# Patient Record
Sex: Female | Born: 1935 | Race: White | Hispanic: No | Smoking: Never smoker
Health system: Southern US, Community
[De-identification: ages and names within clinical notes are randomized; demographics above are authoritative.]

## PROBLEM LIST (undated history)

## (undated) DIAGNOSIS — I517 Cardiomegaly: Secondary | ICD-10-CM

## (undated) DIAGNOSIS — R32 Unspecified urinary incontinence: Secondary | ICD-10-CM

## (undated) DIAGNOSIS — E785 Hyperlipidemia, unspecified: Secondary | ICD-10-CM

## (undated) DIAGNOSIS — M545 Low back pain: Secondary | ICD-10-CM

## (undated) DIAGNOSIS — E049 Nontoxic goiter, unspecified: Secondary | ICD-10-CM

## (undated) DIAGNOSIS — M159 Polyosteoarthritis, unspecified: Secondary | ICD-10-CM

## (undated) DIAGNOSIS — K219 Gastro-esophageal reflux disease without esophagitis: Secondary | ICD-10-CM

## (undated) DIAGNOSIS — D126 Benign neoplasm of colon, unspecified: Secondary | ICD-10-CM

## (undated) DIAGNOSIS — Z8719 Personal history of other diseases of the digestive system: Secondary | ICD-10-CM

## (undated) HISTORY — DX: Cardiomegaly: I51.7

## (undated) HISTORY — DX: Benign neoplasm of colon, unspecified: D12.6

## (undated) HISTORY — PX: THYROIDECTOMY, PARTIAL: SHX18

## (undated) HISTORY — DX: Low back pain: M54.5

## (undated) HISTORY — DX: Polyosteoarthritis, unspecified: M15.9

## (undated) HISTORY — PX: SPINE SURGERY: SHX786

## (undated) HISTORY — DX: Nontoxic goiter, unspecified: E04.9

## (undated) HISTORY — DX: Gastro-esophageal reflux disease without esophagitis: K21.9

## (undated) HISTORY — DX: Hyperlipidemia, unspecified: E78.5

## (undated) HISTORY — PX: ABDOMINAL HYSTERECTOMY: SHX81

## (undated) HISTORY — DX: Unspecified urinary incontinence: R32

## (undated) HISTORY — DX: Personal history of other diseases of the digestive system: Z87.19

---
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
## 1973-02-15 HISTORY — PX: NOSE SURGERY: SHX723

## 1997-02-15 LAB — CONVERTED CEMR LAB: Pap Smear: NORMAL

## 1998-04-07 ENCOUNTER — Other Ambulatory Visit: Admission: RE | Admit: 1998-04-07 | Discharge: 1998-04-07 | Payer: Self-pay | Admitting: Gastroenterology

## 1999-05-11 ENCOUNTER — Encounter: Payer: Self-pay | Admitting: Gynecology

## 1999-05-11 ENCOUNTER — Encounter: Admission: RE | Admit: 1999-05-11 | Discharge: 1999-05-11 | Payer: Self-pay | Admitting: Gynecology

## 2000-03-18 ENCOUNTER — Encounter: Admission: RE | Admit: 2000-03-18 | Discharge: 2000-03-18 | Payer: Self-pay | Admitting: Family Medicine

## 2000-03-18 ENCOUNTER — Encounter: Payer: Self-pay | Admitting: Family Medicine

## 2000-05-23 ENCOUNTER — Encounter: Payer: Self-pay | Admitting: Gastroenterology

## 2000-05-23 ENCOUNTER — Ambulatory Visit (HOSPITAL_COMMUNITY): Admission: RE | Admit: 2000-05-23 | Discharge: 2000-05-23 | Payer: Self-pay | Admitting: Gastroenterology

## 2000-06-08 ENCOUNTER — Encounter: Admission: RE | Admit: 2000-06-08 | Discharge: 2000-06-08 | Payer: Self-pay | Admitting: Gynecology

## 2000-06-08 ENCOUNTER — Encounter: Payer: Self-pay | Admitting: Gynecology

## 2000-06-14 ENCOUNTER — Encounter: Payer: Self-pay | Admitting: Orthopedic Surgery

## 2000-06-14 ENCOUNTER — Encounter: Admission: RE | Admit: 2000-06-14 | Discharge: 2000-06-14 | Payer: Self-pay | Admitting: Orthopedic Surgery

## 2000-06-27 ENCOUNTER — Other Ambulatory Visit: Admission: RE | Admit: 2000-06-27 | Discharge: 2000-06-27 | Payer: Self-pay | Admitting: Gynecology

## 2001-02-22 LAB — HM MAMMOGRAPHY: HM Mammogram: NORMAL

## 2004-09-15 ENCOUNTER — Ambulatory Visit: Payer: Self-pay | Admitting: Cardiovascular Disease

## 2004-09-29 ENCOUNTER — Ambulatory Visit: Payer: Self-pay

## 2004-10-07 ENCOUNTER — Ambulatory Visit: Payer: Self-pay | Admitting: Gastroenterology

## 2004-10-15 ENCOUNTER — Ambulatory Visit: Payer: Self-pay | Admitting: Gastroenterology

## 2004-10-15 ENCOUNTER — Ambulatory Visit (HOSPITAL_COMMUNITY): Admission: RE | Admit: 2004-10-15 | Discharge: 2004-10-15 | Payer: Self-pay | Admitting: Gastroenterology

## 2006-05-04 ENCOUNTER — Ambulatory Visit (HOSPITAL_COMMUNITY): Admission: RE | Admit: 2006-05-04 | Discharge: 2006-05-04 | Payer: Self-pay | Admitting: Gastroenterology

## 2006-05-10 ENCOUNTER — Ambulatory Visit: Payer: Self-pay | Admitting: Gastroenterology

## 2006-06-08 ENCOUNTER — Ambulatory Visit: Payer: Self-pay | Admitting: Gastroenterology

## 2006-06-28 ENCOUNTER — Ambulatory Visit: Payer: Self-pay | Admitting: Gastroenterology

## 2006-06-28 ENCOUNTER — Encounter: Payer: Self-pay | Admitting: Gastroenterology

## 2007-02-23 LAB — HM COLONOSCOPY: HM Colonoscopy: NORMAL

## 2007-08-23 ENCOUNTER — Telehealth: Payer: Self-pay | Admitting: Gastroenterology

## 2008-09-23 ENCOUNTER — Telehealth: Payer: Self-pay | Admitting: Gastroenterology

## 2010-02-19 ENCOUNTER — Encounter
Admission: RE | Admit: 2010-02-19 | Discharge: 2010-02-19 | Payer: Self-pay | Source: Home / Self Care | Attending: Orthopedic Surgery | Admitting: Orthopedic Surgery

## 2010-02-20 ENCOUNTER — Ambulatory Visit
Admission: RE | Admit: 2010-02-20 | Discharge: 2010-02-20 | Payer: Self-pay | Source: Home / Self Care | Attending: Family Medicine | Admitting: Family Medicine

## 2010-02-20 ENCOUNTER — Encounter: Payer: Self-pay | Admitting: Family Medicine

## 2010-02-20 ENCOUNTER — Other Ambulatory Visit: Payer: Self-pay | Admitting: Family Medicine

## 2010-02-20 DIAGNOSIS — M159 Polyosteoarthritis, unspecified: Secondary | ICD-10-CM

## 2010-02-20 DIAGNOSIS — M545 Low back pain, unspecified: Secondary | ICD-10-CM

## 2010-02-20 DIAGNOSIS — K219 Gastro-esophageal reflux disease without esophagitis: Secondary | ICD-10-CM | POA: Insufficient documentation

## 2010-02-20 DIAGNOSIS — Z8719 Personal history of other diseases of the digestive system: Secondary | ICD-10-CM

## 2010-02-20 DIAGNOSIS — R32 Unspecified urinary incontinence: Secondary | ICD-10-CM | POA: Insufficient documentation

## 2010-02-20 DIAGNOSIS — E785 Hyperlipidemia, unspecified: Secondary | ICD-10-CM

## 2010-02-20 DIAGNOSIS — E049 Nontoxic goiter, unspecified: Secondary | ICD-10-CM

## 2010-02-20 DIAGNOSIS — D126 Benign neoplasm of colon, unspecified: Secondary | ICD-10-CM | POA: Insufficient documentation

## 2010-02-20 HISTORY — DX: Gastro-esophageal reflux disease without esophagitis: K21.9

## 2010-02-20 HISTORY — DX: Nontoxic goiter, unspecified: E04.9

## 2010-02-20 HISTORY — DX: Benign neoplasm of colon, unspecified: D12.6

## 2010-02-20 HISTORY — DX: Hyperlipidemia, unspecified: E78.5

## 2010-02-20 HISTORY — DX: Low back pain, unspecified: M54.50

## 2010-02-20 HISTORY — DX: Polyosteoarthritis, unspecified: M15.9

## 2010-02-20 HISTORY — DX: Personal history of other diseases of the digestive system: Z87.19

## 2010-02-20 HISTORY — DX: Unspecified urinary incontinence: R32

## 2010-02-20 LAB — TSH: TSH: 0.71 u[IU]/mL (ref 0.35–5.50)

## 2010-03-04 ENCOUNTER — Encounter: Payer: Self-pay | Admitting: Family Medicine

## 2010-03-16 ENCOUNTER — Telehealth: Payer: Self-pay | Admitting: Family Medicine

## 2010-03-16 DIAGNOSIS — I517 Cardiomegaly: Secondary | ICD-10-CM

## 2010-03-16 HISTORY — DX: Cardiomegaly: I51.7

## 2010-03-19 NOTE — Assessment & Plan Note (Signed)
Summary: NEW PT EST (TXFR SFP/CS) - MEDICARE INS // RS   Vital Signs:  Patient profile:   77 year old female Menstrual status:  postmenopausal Height:      60 inches Weight:      148 pounds BMI:     29.01 Temp:     98.0 degrees F oral Pulse rate:   88 / minute Pulse rhythm:   regular Resp:     12 per minute BP sitting:   140 / 88  (left arm) Cuff size:   regular  Vitals Entered By: Sid Falcon LPN (February 20, 2010 10:24 AM)  Nutrition Counseling: Patient's BMI is greater than 25 and therefore counseled on weight management options.     Menstrual Status postmenopausal Last PAP Result normal   History of Present Illness: New to establish care.  Multiple medical problems reviewed. She has hx multiple back surgeries, GERD, colon polyps, diverticulitis, hx goiter, hyperlipidemia, urine incontinence, osteoarthritis. Very sedentary and limited in activities from defecits from her back  problems.  GERD symptoms stable.  No dysphagia.   Urine urge incontinence,  Symptoms controlled with Sanctura. Hx of goiter with subtotal thyroidectomy.  HAs some chronic fatigue. Thyroid not checked in over 2 years.  Planning for L shoulder replacement probably end of January.  No cardiac or lung disease hx.  nonsmoker.    no flu vaccine yet.  PVX  2009.  Last tetanus unknown.  Preventive Screening-Counseling & Management  Alcohol-Tobacco     Smoking Status: never  Caffeine-Diet-Exercise     Does Patient Exercise: no  Past History:  Family History: Last updated: 02/20/2010 mother, ovarian CA, elevated cholesterol, diabetes type ll  Social History: Last updated: 02/20/2010 Retired Married Never Smoked Alcohol use-no Regular exercise-no 5 pregnancies 5 live births  Risk Factors: Exercise: no (02/20/2010)  Risk Factors: Smoking Status: never (02/20/2010)  Past Medical History: Diverticulitis, hx of GERD Hyperlipidemia Urinary incontinence Hx goiter Polyps in  colon Blood transfusion  Past Surgical History: Hysterectomy 1998 total Tonsillectomy 1936 4 back surgeries 1981-1986 Partial thyroidectomy 1975 ?goiter, no cancer Head surgery 1975 to repair cracked bone over nose PMH-FH-SH reviewed for relevance  Family History: mother, ovarian CA, elevated cholesterol, diabetes type ll  Social History: Retired Married Never Smoked Alcohol use-no Regular exercise-no 5 pregnancies 5 live births Smoking Status:  never Does Patient Exercise:  no  Review of Systems  The patient denies anorexia, fever, weight loss, weight gain, hoarseness, chest pain, syncope, dyspnea on exertion, peripheral edema, prolonged cough, headaches, hemoptysis, abdominal pain, melena, hematochezia, severe indigestion/heartburn, hematuria, muscle weakness, transient blindness, depression, and enlarged lymph nodes.    Physical Exam  General:  Well-developed,well-nourished,in no acute distress; alert,appropriate and cooperative throughout examination Head:  Normocephalic and atraumatic without obvious abnormalities. No apparent alopecia or balding. Eyes:  pupils equal, pupils round, and pupils reactive to light.   Ears:  External ear exam shows no significant lesions or deformities.  Otoscopic examination reveals clear canals, tympanic membranes are intact bilaterally without bulging, retraction, inflammation or discharge. Hearing is grossly normal bilaterally. Mouth:  Oral mucosa and oropharynx without lesions or exudates.  Teeth in good repair. Neck:  No deformities, masses, or tenderness noted. Lungs:  Normal respiratory effort, chest expands symmetrically. Lungs are clear to auscultation, no crackles or wheezes. Heart:  normal rate and regular rhythm.   Abdomen:  soft and non-tender.  ?lipoma nontender and about 3 cm diameter L mid abdomen.  no other masses. Extremities:  no edema. Neurologic:  alert & oriented X3 and cranial nerves II-XII intact.   Skin:  no rashes  and no suspicious lesions.   Cervical Nodes:  No lymphadenopathy noted Psych:  normally interactive, good eye contact, not anxious appearing, and not depressed appearing.     Impression & Recommendations:  Problem # 1:  URINARY INCONTINENCE (ICD-788.30)  Problem # 2:  GERD (ICD-530.81)  Her updated medication list for this problem includes:    Nexium 40 Mg Cpdr (Esomeprazole magnesium) .Marland Kitchen... 1 capsule each day 30 minutes before meal  Problem # 3:  DIVERTICULITIS, HX OF (ICD-V12.79)  Problem # 4:  BACK PAIN, LUMBAR, CHRONIC (ICD-724.2)  Her updated medication list for this problem includes:    Methocarbamol 500 Mg Tabs (Methocarbamol) ..... Once daily  Problem # 5:  OSTEOARTHRITIS, MULTIPLE JOINTS (ICD-715.89)  Problem # 6:  COLONIC POLYPS (ICD-211.3)  Problem # 7:  Preventive Health Care (ICD-V70.0) needs flu vaccine.  PVX up to date.  Problem # 8:  PREOPERATIVE EXAMINATION (ICD-V72.84) EKG no acute changes.  Set up CXR.   Orders: EKG w/ Interpretation (93000) T-2 View CXR (71020TC)  Complete Medication List: 1)  Nexium 40 Mg Cpdr (Esomeprazole magnesium) .Marland Kitchen.. 1 capsule each day 30 minutes before meal 2)  Sanctura Xr 60 Mg Xr24h-cap (Trospium chloride) .... Once daily 3)  Methocarbamol 500 Mg Tabs (Methocarbamol) .... Once daily  Other Orders: Flu Vaccine 27yrs + [phone]) Admin 1st Vaccine (98119) TLB-TSH (Thyroid Stimulating Hormone) (84443-TSH)   Orders Added: 1)  EKG w/ Interpretation [93000] 2)  Flu Vaccine 44yrs + [90658] 3)  Admin 1st Vaccine [90471] 4)  TLB-TSH (Thyroid Stimulating Hormone) [84443-TSH] 5)  T-2 View CXR [71020TC] 6)  New Patient Level IV [14782]   Immunization History:  Pneumovax Immunization History:    Pneumovax:  historical (02/16/2008)  Immunizations Administered:  Influenza Vaccine # 1:    Vaccine Type: Fluvax 3+    Site: left deltoid    Mfr: Sanofi Pasteur    Dose: 0.5 ml    Route: IM    Given by: Sid Falcon LPN    Exp.  Date: 07/17/2010    Lot #: [id number]  Flu Vaccine Consent Questions:    Do you have a history of severe allergic reactions to this vaccine? no    Any prior history of allergic reactions to egg and/or gelatin? no    Do you have a sensitivity to the preservative Thimersol? no    Do you have a past history of Guillan-Barre Syndrome? no    Do you currently have an acute febrile illness? no    Have you ever had a severe reaction to latex? no    Vaccine information given and explained to patient? yes    Are you currently pregnant? no   Immunization History:  Pneumovax Immunization History:    Pneumovax:  Historical (02/16/2008)  Immunizations Administered:  Influenza Vaccine # 1:    Vaccine Type: Fluvax 3+    Site: left deltoid    Mfr: Sanofi Pasteur    Dose: 0.5 ml    Route: IM    Given by: Sid Falcon LPN    Exp. Date: 07/17/2010    Lot #: [id number]  Preventive Care Screening  Colonoscopy:    Date:  02/16/2007    Results:  normal   Mammogram:    Date:  02/15/2001    Results:  normal   Pap Smear:    Date:  02/15/1997    Results:  normal

## 2010-03-24 ENCOUNTER — Ambulatory Visit (HOSPITAL_COMMUNITY): Payer: Medicare Other | Attending: Family Medicine

## 2010-03-24 DIAGNOSIS — R9389 Abnormal findings on diagnostic imaging of other specified body structures: Secondary | ICD-10-CM | POA: Insufficient documentation

## 2010-03-24 DIAGNOSIS — I079 Rheumatic tricuspid valve disease, unspecified: Secondary | ICD-10-CM | POA: Insufficient documentation

## 2010-03-24 DIAGNOSIS — I379 Nonrheumatic pulmonary valve disorder, unspecified: Secondary | ICD-10-CM | POA: Insufficient documentation

## 2010-03-24 DIAGNOSIS — I059 Rheumatic mitral valve disease, unspecified: Secondary | ICD-10-CM | POA: Insufficient documentation

## 2010-03-24 DIAGNOSIS — E785 Hyperlipidemia, unspecified: Secondary | ICD-10-CM | POA: Insufficient documentation

## 2010-03-24 DIAGNOSIS — I517 Cardiomegaly: Secondary | ICD-10-CM | POA: Insufficient documentation

## 2010-03-24 DIAGNOSIS — I319 Disease of pericardium, unspecified: Secondary | ICD-10-CM | POA: Insufficient documentation

## 2010-03-25 NOTE — Progress Notes (Signed)
Summary: surgical clearance  Phone Note Call from Patient Call back at Home Phone [phone]   Caller: Patient Reason for Call: Talk to Doctor Summary of Call: patient is calling because her doctor at [facility] is waiting on a surgical clearance. Initial call taken by: Kern Reap CMA Duncan Dull),  March 16, 2010 2:18 PM  Follow-up for Phone Call        spoke with pt . CXR mild cardiomegaly.  Pt for elective ortho surgey. No dypsnea or chest pain.  Will set up 2 D ECHO to evaluate. NOtify pt of appt. Follow-up by: Evelena Peat MD,  March 17, 2010 8:33 AM  New Problems: CARDIOMEGALY (ICD-429.3)   New Problems: CARDIOMEGALY (ICD-429.3)

## 2010-03-25 NOTE — Letter (Signed)
Summary: Request for Surgical Clearance/[facility]  Request for Surgical Clearance/[facility]   Imported By: Maryln Gottron 03/20/2010 12:36:54  _____________________________________________________________________  External Attachment:    Type:   Image     Comment:   External Document

## 2010-03-27 ENCOUNTER — Telehealth: Payer: Self-pay | Admitting: *Deleted

## 2010-03-27 NOTE — Telephone Encounter (Signed)
Pt would like Echo results.

## 2010-03-27 NOTE — Telephone Encounter (Signed)
Message left for Dr Dietrich Pates surgery scheduler Clydie Braun at [facility] that it is OK to schedule surgery for pt, pt informed also

## 2010-03-27 NOTE — Telephone Encounter (Signed)
ECHO showed no acute abnormality.  She should be able to schedule surgery at this time.  We need to contact [facility] so they can schedule her surgery.

## 2010-04-20 ENCOUNTER — Encounter (HOSPITAL_COMMUNITY)
Admission: RE | Admit: 2010-04-20 | Discharge: 2010-04-20 | Disposition: A | Discharge status: Home / Self Care | Payer: Medicare Other | Source: Ambulatory Visit | Attending: Orthopedic Surgery | Admitting: Orthopedic Surgery

## 2010-04-20 DIAGNOSIS — Z01818 Encounter for other preprocedural examination: Secondary | ICD-10-CM | POA: Insufficient documentation

## 2010-04-20 DIAGNOSIS — Z01810 Encounter for preprocedural cardiovascular examination: Secondary | ICD-10-CM | POA: Insufficient documentation

## 2010-04-20 DIAGNOSIS — Z01812 Encounter for preprocedural laboratory examination: Secondary | ICD-10-CM | POA: Insufficient documentation

## 2010-04-20 DIAGNOSIS — M19019 Primary osteoarthritis, unspecified shoulder: Secondary | ICD-10-CM | POA: Insufficient documentation

## 2010-04-20 LAB — CBC
Hemoglobin: 14.5 g/dL (ref 12.0–15.0)
Platelets: 343 10*3/uL (ref 150–400)
RBC: 4.88 MIL/uL (ref 3.87–5.11)
WBC: 7.2 10*3/uL (ref 4.0–10.5)

## 2010-04-20 LAB — PROTIME-INR
INR: 0.94 (ref 0.00–1.49)
Prothrombin Time: 12.8 seconds (ref 11.6–15.2)

## 2010-04-20 LAB — TYPE AND SCREEN
ABO/RH(D): O POS
Antibody Screen: NEGATIVE

## 2010-04-20 LAB — BASIC METABOLIC PANEL
CO2: 23 mEq/L (ref 19–32)
Chloride: 105 mEq/L (ref 96–112)
GFR calc Af Amer: >60 mL/min (ref >60)
Potassium: 3.9 mEq/L (ref 3.5–5.1)
Sodium: 138 mEq/L (ref 135–145)

## 2010-04-20 LAB — DIFFERENTIAL
Basophils Relative: 0 % (ref 0–1)
Eosinophils Absolute: 0 10*3/uL (ref 0.0–0.7)
Lymphs Abs: 2.1 10*3/uL (ref 0.7–4.0)
Neutro Abs: 4.4 10*3/uL (ref 1.7–7.7)
Neutrophils Relative %: 61 % (ref 43–77)

## 2010-04-20 LAB — ABO/RH: ABO/RH(D): O POS

## 2010-04-20 LAB — APTT: aPTT: 27 seconds (ref 24–37)

## 2010-04-24 ENCOUNTER — Ambulatory Visit (HOSPITAL_COMMUNITY)
Admission: RE | Admit: 2010-04-24 | Discharge: 2010-04-24 | Disposition: A | Discharge status: Home / Self Care | Payer: Medicare Other | Source: Ambulatory Visit | Attending: Orthopedic Surgery | Admitting: Orthopedic Surgery

## 2010-04-24 ENCOUNTER — Inpatient Hospital Stay (HOSPITAL_COMMUNITY)
Admission: RE | Admit: 2010-04-24 | Discharge: 2010-04-29 | DRG: 483 | Disposition: A | Discharge status: Skilled Nursing Facility | Payer: Medicare Other | Source: Ambulatory Visit | Attending: Orthopedic Surgery | Admitting: Orthopedic Surgery

## 2010-04-24 ENCOUNTER — Inpatient Hospital Stay (HOSPITAL_COMMUNITY): Payer: Medicare Other

## 2010-04-24 ENCOUNTER — Other Ambulatory Visit (HOSPITAL_COMMUNITY): Payer: Self-pay | Admitting: Orthopedic Surgery

## 2010-04-24 DIAGNOSIS — M19019 Primary osteoarthritis, unspecified shoulder: Principal | ICD-10-CM | POA: Diagnosis present

## 2010-04-24 DIAGNOSIS — J9600 Acute respiratory failure, unspecified whether with hypoxia or hypercapnia: Secondary | ICD-10-CM | POA: Diagnosis not present

## 2010-04-24 DIAGNOSIS — K5900 Constipation, unspecified: Secondary | ICD-10-CM | POA: Diagnosis not present

## 2010-04-24 DIAGNOSIS — Z01812 Encounter for preprocedural laboratory examination: Secondary | ICD-10-CM

## 2010-04-24 DIAGNOSIS — R000 Tachycardia, unspecified: Secondary | ICD-10-CM | POA: Diagnosis not present

## 2010-04-24 DIAGNOSIS — J984 Other disorders of lung: Secondary | ICD-10-CM

## 2010-04-24 DIAGNOSIS — E785 Hyperlipidemia, unspecified: Secondary | ICD-10-CM | POA: Diagnosis present

## 2010-04-24 DIAGNOSIS — I2699 Other pulmonary embolism without acute cor pulmonale: Secondary | ICD-10-CM | POA: Diagnosis not present

## 2010-04-24 DIAGNOSIS — K219 Gastro-esophageal reflux disease without esophagitis: Secondary | ICD-10-CM | POA: Diagnosis present

## 2010-04-25 LAB — CBC
HCT: 34.3 % — ABNORMAL LOW (ref 36.0–46.0)
Hemoglobin: 11.4 g/dL — ABNORMAL LOW (ref 12.0–15.0)
MCH: 29.2 pg (ref 26.0–34.0)
MCHC: 33.2 g/dL (ref 30.0–36.0)
MCV: 87.7 fL (ref 78.0–100.0)

## 2010-04-25 LAB — BASIC METABOLIC PANEL
BUN: 9 mg/dL (ref 6–23)
CO2: 24 mEq/L (ref 19–32)
Calcium: 8.2 mg/dL — ABNORMAL LOW (ref 8.4–10.5)
Glucose, Bld: 134 mg/dL — ABNORMAL HIGH (ref 70–99)
Sodium: 137 mEq/L (ref 135–145)

## 2010-04-26 LAB — CBC
Hemoglobin: 10.8 g/dL — ABNORMAL LOW (ref 12.0–15.0)
MCH: 28.6 pg (ref 26.0–34.0)
MCHC: 32.5 g/dL (ref 30.0–36.0)

## 2010-04-26 LAB — BASIC METABOLIC PANEL
CO2: 24 mEq/L (ref 19–32)
Calcium: 8.4 mg/dL (ref 8.4–10.5)
Creatinine, Ser: 0.55 mg/dL (ref 0.4–1.2)
GFR calc Af Amer: >60 mL/min (ref >60)
Glucose, Bld: 130 mg/dL — ABNORMAL HIGH (ref 70–99)

## 2010-04-27 ENCOUNTER — Inpatient Hospital Stay (HOSPITAL_COMMUNITY): Payer: Medicare Other

## 2010-04-27 LAB — PROTIME-INR
INR: 1.11 (ref 0.00–1.49)
Prothrombin Time: 14.5 seconds (ref 11.6–15.2)

## 2010-04-27 LAB — BASIC METABOLIC PANEL
BUN: 5 mg/dL — ABNORMAL LOW (ref 6–23)
Calcium: 7.9 mg/dL — ABNORMAL LOW (ref 8.4–10.5)
Creatinine, Ser: 0.48 mg/dL (ref 0.4–1.2)
GFR calc non Af Amer: >60 mL/min (ref >60)
Glucose, Bld: 145 mg/dL — ABNORMAL HIGH (ref 70–99)

## 2010-04-27 LAB — HEPATIC FUNCTION PANEL
AST: 27 U/L (ref 0–37)
Albumin: 2.5 g/dL — ABNORMAL LOW (ref 3.5–5.2)
Bilirubin, Direct: <0.1 mg/dL (ref 0.0–0.3)

## 2010-04-27 LAB — CBC
MCH: 29 pg (ref 26.0–34.0)
MCV: 88.7 fL (ref 78.0–100.0)
Platelets: 227 10*3/uL (ref 150–400)
RDW: 14.5 % (ref 11.5–15.5)
WBC: 10.3 10*3/uL (ref 4.0–10.5)

## 2010-04-27 LAB — TROPONIN I: Troponin I: 0.09 ng/mL — ABNORMAL HIGH (ref 0.00–0.06)

## 2010-04-27 MED ORDER — IOHEXOL 300 MG/ML  SOLN: 100.0000 mL | Freq: Once | INTRAMUSCULAR | Status: AC | PRN

## 2010-04-27 NOTE — Consult Note (Signed)
NAMEZORA, [name] NO.:  [id number]  MEDICAL RECORD NO.:  [id number]           PATIENT TYPE:  I  LOCATION:  5036                         FACILITY:  [facility]  PHYSICIAN:  Andreas Blower, MD       DATE OF BIRTH:  February 27, 1927  DATE OF CONSULTATION: DATE OF DISCHARGE:                                CONSULTATION   REFERRING PHYSICIAN:  Almedia Balls. Ranell Patrick, MD  PRIMARY CARE PHYSICIAN:  Evelena Peat, MD  ORTHOPEDIC PHYSICIAN:  Almedia Balls. Norris, MD  Clinical question to be answered is acute shortness of breath and tachycardia.  HISTORY OF PRESENT ILLNESS:  Ms. [name] is an 77 year old Caucasian female with history of borderline hypertension, not on any antihypertensive medications; GERD; hyperlipidemia; constipation; chronic right and left shoulder pain, who had left shoulder reverse total shoulder arthroplasty on April 24, 2010.  She reported that she was short of breath yesterday April 26, 2010 in the evening.  However, hershortness of breath improved.  Today after working with Physical Therapy at around at around 10 to 11 AM, subsequently after that she became severely short of breath with hypoxemia.  She desaturated down to low 90s to upper 80s requiring oxygen.  Subsequently since then her oxygen requirements have gone up and currently is on a face mask.  She had a CT of the chest with contrast which showed multiple small submental PE bilaterally.  She was started on heparin and the hospitalist service was consulted for further management.  The patient denies any recent fevers, chills, denies any nausea or vomiting, denies any chest pain.  She does report her shortness of breath has improved since this morning.  Denies any abdominal pain.  Does feel constipated.  Denies any diarrhea, headaches, or vision changes.  REVIEW OF SYSTEMS:  All systems are reviewed with the patient and was positive as per HPI, otherwise all other systems are negative.  PAST  MEDICAL HISTORY: 1. Borderline hypertension.  Not on any antihypertensive medications. 2. GERD. 3. Hyperlipidemia. 4. History of constipation 5. Chronic left and right shoulder pain. 6. Status post left shoulder reverse total arthroplasty on April 24, 2010. 7. History of leg muscle spasm, takes p.r.n., methocarbamol. 8. History of 4 back surgeries. 9. History of hysterectomy.  SOCIAL HISTORY:  The patient does not smoke.  Reports that she would smoke 1 cigarette a week when she was young, has not smoked for the last 60 years.  Denies any illegal drugs or substances.  FAMILY HISTORY:  Significant for mother who died from uterine cancer in July 20, 1964.  Father died from old age in 31.  HOME MEDICATIONS:  The patient reports that she is on: 1. Methocarbamol 500 mg twice daily as needed. 2. Docusate 200 mg p.o. daily. 3. Hydrocodone/APAP 5/325 mg p.o. q.6 h. 4. Tylenol extra strength 1000 mg every 6 hours as needed for pain. 5. Multivitamins 1 tablet p.o. daily. 6. Fish oil over-the-counter 1 capsule p.o. daily 7. Nexium 40 mg p.o. daily 8. Sanctura extended release 60 mg p.o. daily.  PHYSICAL EXAMINATION:  VITAL SIGNS:  Temperature is 97.3, heart rate is 125, respiration is 24,  blood pressure is 143/83, satting in the 90s on face mask. GENERAL:  The patient was awake, in some distress from shortness of breath, was lying in bed. HEENT:  Extraocular motions are intact.  Pupils equal, round, had moist mucous membranes. NECK:  Supple. HEART:  Regular with S1, S2, was tachycardic. LUNGS:  Good air movement.  Do not appreciate any wheezing or crackles. ABDOMEN:  Soft, nontender, nondistended.  Positive bowel sounds. EXTREMITIES:  The patient good peripheral pulses with trace edema. NEURO:  Cranial nerves II-XII grossly intact at 5/5 motor strength in upper as well as lower extremities.  RADIOLOGY/IMAGING:  The patient had a CT of the chest with contrast on April 27, 2010 which  showed multiple small subsegmental pulmonary emboli bilaterally.  No findings to suggest right heart strain.  Large hiatal hernia.  Left lower lobe atelectasis.  Status post left shoulder arthroplasty. The patient had multiple x-rays on April 24, 2010, most recent showed anatomic alignment post left shoulder arthroplasty without acute complicating features.  LABORATORY DATA:  CBC shows a white count of 8.4, hemoglobin 10.8, hematocrit 33.2, platelet count 225.  Electrolytes normal with a creatinine of 0.54.  All the labs most recently were on April 26, 2010.  ASSESSMENT AND PLAN: 1. Acute hypoxic respiratory failure due to acute pulmonary embolism.     Currently, the patient is on face mask.  Her breathing is improved,     is talking to me in broken sentences, is improved from earlier     today. 2. Acute multiple bilateral subsegmental pulmonary emboli.  The     patient has already been started on heparin.  We will start on     Coumadin.  We will get a 2-D echocardiogram for better evaluation.     The patient will need a 5-day bridge of heparin products with     Coumadin.  I suspect her pulmonary embolism is most likely due to     poor mobility post surgery.  Agree with having the patient     transferred to step-down for closer monitoring. 3. Borderline hypertension, blood pressure stable at this time.     Continue to monitor for now. 4. Hyperlipidemia.  Reports that she has been on cholesterol-lowering     medications; however as her cholesterol improved, she was taken off     of that medication, currently is on fish oil.  Continue omega-3     fatty acids. 5. Constipation.  The patient is currently on a bowel regimen. 6. Gastroesophageal reflux disease/hiatal hernia.  Continue the     patient on PPI. 7. Left shoulder reverse total arthroplasty on April 24, 2010.     Per primary service okay to use heparin. 8. Prophylaxis.  Currently on a heparin drip.  We will start the     patient  on Coumadin.  Thank you for the consultation.  We will continue to follow.   Andreas Blower, MD   SR/MEDQ  D:  04/27/2010  T:  04/27/2010  Job:  [id number]  Electronically Signed by Wardell Heath Henreitta Spittler  on 04/27/2010 09:58:27 PM

## 2010-04-28 LAB — BASIC METABOLIC PANEL
BUN: 6 mg/dL (ref 6–23)
Chloride: 106 mEq/L (ref 96–112)
GFR calc Af Amer: >60 mL/min (ref >60)
GFR calc non Af Amer: >60 mL/min (ref >60)
Potassium: 4.3 mEq/L (ref 3.5–5.1)
Sodium: 136 mEq/L (ref 135–145)

## 2010-04-28 LAB — CBC
HCT: 29.7 % — ABNORMAL LOW (ref 36.0–46.0)
Hemoglobin: 10.5 g/dL — ABNORMAL LOW (ref 12.0–15.0)
Hemoglobin: 9.9 g/dL — ABNORMAL LOW (ref 12.0–15.0)
MCH: 29.2 pg (ref 26.0–34.0)
MCHC: 33.3 g/dL (ref 30.0–36.0)
Platelets: 222 10*3/uL (ref 150–400)
RBC: 3.6 MIL/uL — ABNORMAL LOW (ref 3.87–5.11)
RBC: 3.35 MIL/uL — ABNORMAL LOW (ref 3.87–5.11)
WBC: 9.3 10*3/uL (ref 4.0–10.5)

## 2010-04-28 LAB — PROTIME-INR
INR: 1.23 (ref 0.00–1.49)
Prothrombin Time: 15.7 seconds — ABNORMAL HIGH (ref 11.6–15.2)

## 2010-04-28 LAB — TROPONIN I
Troponin I: 0.06 ng/mL (ref 0.00–0.06)
Troponin I: 0.08 ng/mL — ABNORMAL HIGH (ref 0.00–0.06)

## 2010-04-28 LAB — HEPARIN LEVEL (UNFRACTIONATED): Heparin Unfractionated: 0.19 IU/mL — ABNORMAL LOW (ref 0.30–0.70)

## 2010-04-28 MED ORDER — IOHEXOL 300 MG/ML  SOLN
100.0000 mL | Freq: Once | INTRAMUSCULAR | Status: AC | PRN
  Administered 2010-04-28: 100 mL via INTRAVENOUS

## 2010-04-29 LAB — PROTIME-INR
INR: 2.7 — ABNORMAL HIGH (ref 0.00–1.49)
Prothrombin Time: 28.8 seconds — ABNORMAL HIGH (ref 11.6–15.2)

## 2010-04-29 LAB — BASIC METABOLIC PANEL
BUN: 10 mg/dL (ref 6–23)
Calcium: 8.3 mg/dL — ABNORMAL LOW (ref 8.4–10.5)
Chloride: 108 mEq/L (ref 96–112)
Creatinine, Ser: 0.58 mg/dL (ref 0.4–1.2)
GFR calc Af Amer: >60 mL/min (ref >60)
GFR calc non Af Amer: >60 mL/min (ref >60)

## 2010-05-02 NOTE — Discharge Summary (Signed)
  [name], [name] NO.:  [id number]  MEDICAL RECORD NO.:  [id number]           PATIENT TYPE:  O  LOCATION:  XRAY                         FACILITY:  [facility]  PHYSICIAN:  Almedia Balls. Ranell Patrick, M.D. DATE OF BIRTH:  1927/05/10  DATE OF ADMISSION:  04/24/2010 DATE OF DISCHARGE:  04/27/2010                              DISCHARGE SUMMARY   ADMISSION DIAGNOSIS:  Left shoulder rotator cuff arthropathy.  DISCHARGE DIAGNOSIS:  Left shoulder rotator cuff arthropathy, status post reverse total shoulder arthroplasty.  BRIEF HISTORY:  The patient is an 77 year old female with worsening left shoulder pain and function secondary to rotator cuff arthropathy. Patient elected for surgical management, decrease pain and increase function.  PROCEDURE:  The patient had left total shoulder arthroplasty reversed by Dr. Malon Kindle on April 24, 2010.  Assistant was [employer], PA-C.  General anesthesia was used.  No complications.  HOSPITAL COURSE:  The patient admitted on April 24, 2010 for the above- stated procedure, which she tolerated well.  After adequate time of post anesthesia care, she was out up to 5000, postop day #1.  The patient complained of moderate pain to the left shoulder and was able to work gently with physical therapy and occupational therapy.  The rehab facility would be the best place for her.  The patient was in agreement with the plan.  She continued to work gently with physical therapy and occupational therapy over the next couple of days.  She was afebrile. Labs were in acceptable limits.  Neurovascularly, she is intact.  Wound was healing well.  She stayed in her sling.  DISCHARGE PLAN:  The patient will be discharged to skilled nursing facility on April 27, 2010.  Her condition is stable.  Her diet is regular.  The patient has no known drug allergies.  DISCHARGE MEDICATIONS:  Cipro 500 mg b.i.d. for 7 days, Enablex 7.5 mg daily, Robaxin 500 mg p.o.  q.6 hours, Protonix 80 mg daily, Norco 5/325 1-2 tablets every 4-6 hours for pain.  FOLLOWUP:  The patient will follow back up with Dr. Malon Kindle in 2 weeks.     Thomas B. Dixon, P.A.   ______________________________ Almedia Balls. Ranell Patrick, M.D.   TBD/MEDQ  D:  04/27/2010  T:  04/27/2010  Job:  [id number]  Electronically Signed by Standley Dakins P.A. on 04/28/2010 04:38:46 PM Electronically Signed by Malon Kindle  on 05/02/2010 11:22:33 AM

## 2010-05-02 NOTE — H&P (Signed)
  [name], [name] NO.:  [id number]  MEDICAL RECORD NO.:  [id number]           PATIENT TYPE:  LOCATION:                                 FACILITY:  PHYSICIAN:  Almedia Balls. Ranell Patrick, M.D. DATE OF BIRTH:  [birth year]  DATE OF ADMISSION: DATE OF DISCHARGE:                             HISTORY & PHYSICAL   CHIEF COMPLAINT:  Left shoulder pain.  HISTORY OF PRESENT ILLNESS:  The patient is an 77 year old female worsening left shoulder pain secondary to rotator cuff arthropathy.  The patient elected for surgical management to decrease pain and increase function.  PAST MEDICAL HISTORY:  Hyperlipidemia.  FAMILY MEDICAL HISTORY:  Cancer and diabetes.  SOCIAL HISTORY:  The patient of Dr. Caryl Never.  She does not smoke or use alcohol.  DRUG ALLERGIES:  None.  CURRENT MEDICATIONS: 1. Nexium 10 mg daily. 2. Norco 5/325 1 or 2 tablets q.4-6 hours p.r.n. pain. 3. Sanctura 60 mg daily.  REVIEW OF SYSTEMS:  Shows the patient does have some impaired hearing, pain on range of motion of bilateral shoulders, also history of chronic constipation.  PHYSICAL EXAMINATION:  VITAL SIGNS:  Pulse 66, respirations 16, blood pressure 118/82. GENERAL:  The patient is alert and appropriate 77 year old female, in no acute distress.  Adequate mood and affect here in the office today. HEAD AND NECK:  Shows cranial nerves II-XII grossly intact. NECK:  Shows full range of motion without any tenderness. CHEST:  Active breath sounds bilaterally.  No wheeze, rhonchi, or rales. HEART:  Regular rate and rhythm.  No murmur. ABDOMEN:  Nontender, nondistended. EXTREMITIES:  The patient does have moderate tenderness in bilateral shoulder with range of motion, decreased strength, 4/5 bilaterally.  She has no edema and no rashes.  X-ray showed rotator cuff arthropathy of the left shoulder.  IMPRESSION:  Left shoulder pain secondary to rotator cuff arthropathy.  PLAN OF ACTION:  Left total  shoulder arthroplasty reversed by Dr. Malon Kindle.     Thomas B. Dixon, P.A.   ______________________________ Almedia Balls. Ranell Patrick, M.D.    TBD/MEDQ  D:  04/16/2010  T:  04/17/2010  Job:  [id number]  Electronically Signed by Standley Dakins P.A. on 04/28/2010 04:38:43 PM Electronically Signed by Malon Kindle  on 05/02/2010 11:22:28 AM

## 2010-05-02 NOTE — Op Note (Signed)
NAMELATEEFA, [name] NO.:  [id number]  MEDICAL RECORD NO.:  [id number]           PATIENT TYPE:  LOCATION:                                 FACILITY:  PHYSICIAN:  Almedia Balls. Ranell Patrick, M.D. DATE OF BIRTH:  06-08-1927  DATE OF PROCEDURE:  04/24/2010 DATE OF DISCHARGE:                              OPERATIVE REPORT   PREOPERATIVE DIAGNOSIS:  Left shoulder rotator cuff tear arthropathy.  POSTOPERATIVE DIAGNOSIS:  Left shoulder rotator cuff tear arthropathy.  PROCEDURE PERFORMED:  Left shoulder reverse total shoulder arthroplasty using DePuy Delta Xtend system.  ATTENDING SURGEON:  Almedia Balls. Ranell Patrick, MD  ASSISTANT:  Donnie Coffin. Dixon, PA-C  ANESTHESIA:  General anesthesia was used plus interscalene block.  ESTIMATED BLOOD LOSS:  Minimal.  FLUID REPLACEMENT:  1200 mL crystalloid.  INSTRUMENT COUNT:  Correct.  There were no complications.  Perioperative antibiotics were given.  INDICATIONS:  The patient is an 77 year old female with worsening left shoulder pain and loss of function secondary to rotator cuff tear arthropathy.  The patient has had progressive pain despite conservative who presents now for operative total shoulder arthroplasty to restore function, eliminate pain to her shoulder.  Informed consent obtained.  DESCRIPTION OF PROCEDURE:  After adequate level was anesthesia was achieved, the patient was positioned in the modified beach-chair position.  Left shoulder was sterilely prepped and draped in usual manner.  Deltopectoral approach was utilized during the coracoid process extending distally to the anterior humerus.  Dissection down through subcutaneous tissues using Bovie electrocautery.  The cephalic vein was identified and taken laterally to the deltoid and pectoralis was taken medially.  The upper centimeter pectoralis was released.  Conjoined tendon identified, taken medially as well.  At this point, we identified the subscap and released it  off the lesser tuberosity.  Progressive soft tissue release off the humeral neck followed by removal of anterior- inferior capsule.  We then delivered the humerus out of the wound.  We then reamed up to a size 10 reamer and then went ahead and placed our intramedullary neck resection guide and resected our head at 10 degrees of retroversion using the deltopectoral guide.  Once this was done, we placed a metal shim to protect the bone, retracted the humerus posteriorly, did a 360 degrees circumferential release removing glenoid labrum as well as capsular tissue.  We were careful to protect the axillary nerve, which was visualized and palpated during the procedure. Next, we placed our central guide pin, reamed for the metaglene, and then went ahead and placed our central guide hole for the metaglene. We impacted the metaglene into place, had 42 inferior screw which was locked, a 30 superior screw into the coracoid base which was locked and then a posterior nonlocked 18 screw.  Anteriorly, we could not get the screw into the narrowness of her glenoid.  At this point, we had a nice stable metaglene.  We then placed 38 neutral glenosphere in place, a 38 standard and we screwed that into position.  We then went ahead and directed our attention towards the humeral side again.  We reamed metaphyseally for a size 1 eccentric left and then  impacted the real hydroxyapatite coated stem into position.  This was a size 10 stem with one left metaphysis set on 0 degrees.  We put that in about 10 degrees of retroversion, impacted that in place with impaction grafting technique using some available bone graft in the head, gaining excellent purchase.  We went ahead and reduced the shoulder with a trial +6, which we were happy with soft tissue balancing.  Selected the real +6 trial, reduced the shoulder, we were very pleased with soft tissue balancing. Conjoined nice and tight.  The patient had a negative  sulcus and no gapping with maximal external rotation.  Thorough irrigation of the entire wound.  Check the axillary nerve which was in continuity and free from the device.  We also checked to make sure there is no bony or soft tissue impingement, none was noted.  Final irrigation followed by closure deltopectoral with 0 Vicryl suture followed by 2-0 Vicryl subcutaneous closure and 4-0 Monocryl for skin.  Steri-Strips applied followed by sterile dressing.  The patient tolerated the surgery well.     Almedia Balls. Ranell Patrick, M.D.     SRN/MEDQ  D:  04/24/2010  T:  04/25/2010  Job:  [id number]  Electronically Signed by Malon Kindle  on 05/02/2010 11:22:39 AM

## 2010-05-20 NOTE — Discharge Summary (Signed)
  [name], [name] NO.:  [id number]  MEDICAL RECORD NO.:  [id number]           PATIENT TYPE:  I  LOCATION:  4714                         FACILITY:  [facility]  PHYSICIAN:  Almedia Balls. Ranell Patrick, M.D. DATE OF BIRTH:  1927-10-13  DATE OF ADMISSION:  04/24/2010 DATE OF DISCHARGE:  05/01/2010                        DISCHARGE SUMMARY - REFERRING   ADDENDUM:  ADMISSION DIAGNOSIS:  Left shoulder pain secondary to rotator cuff arthropathy.  DISCHARGE DIAGNOSES: 1. Left shoulder pain secondary to rotator cuff arthropathy, status     post reverse total shoulder arthroplasty. 2. Acute pulmonary embolus.  The patient was scheduled for discharge on April 27, 2010.  Prior to discharge, the patient developed tachycardia and tachypnea with shortness of breath.  Denied chest pain.  The patient had an immediate chest CT and EKG, completed chest CT exhibiting acute pulmonary embolus. We did get a consultation from Dr. Andreas Blower for anticoagulation and management of her symptoms and tachycardia.  The patient is placed on heparin, transferred to Step-Down Unit and monitored closely.  Next day after PE, the patient was doing much better in regard to tachycardia and tachypnea, feeling much better, was able to continue improving over the next couple of days and she was therapeutic with heparin and Coumadin. She was discharged to skilled nursing facility.  On discharge, her INR was 2.7.  We would like to keep between 2.5 and 3.5 with a known PE and she will follow up closely with Dr. Malon Kindle in 1 to 2 weeks.  In addition to discharge medicines, Coumadin per Pharmacy Protocol to keep INR between 2.5 and 3.5.     Thomas B. Dixon, P.A.   ______________________________ Almedia Balls. Ranell Patrick, M.D.    TBD/MEDQ  D:  04/29/2010  T:  04/29/2010  Job:  [id number]  Electronically Signed by Standley Dakins P.A. on 05/12/2010 08:15:28 AM Electronically Signed by Malon Kindle  on 05/20/2010  08:07:47 PM

## 2010-06-23 ENCOUNTER — Encounter: Payer: Self-pay | Admitting: Family Medicine

## 2010-06-24 ENCOUNTER — Ambulatory Visit: Payer: Medicare Other | Admitting: Family Medicine

## 2010-06-24 ENCOUNTER — Encounter: Payer: Self-pay | Admitting: Family Medicine

## 2010-06-24 ENCOUNTER — Ambulatory Visit (INDEPENDENT_AMBULATORY_CARE_PROVIDER_SITE_OTHER): Payer: Medicare Other | Admitting: Family Medicine

## 2010-06-24 VITALS — BP 140/100 | Temp 98.3°F | Wt 165.0 lb

## 2010-06-24 DIAGNOSIS — R32 Unspecified urinary incontinence: Secondary | ICD-10-CM

## 2010-06-24 DIAGNOSIS — K219 Gastro-esophageal reflux disease without esophagitis: Secondary | ICD-10-CM

## 2010-06-24 DIAGNOSIS — M545 Low back pain: Secondary | ICD-10-CM

## 2010-06-24 DIAGNOSIS — M159 Polyosteoarthritis, unspecified: Secondary | ICD-10-CM

## 2010-06-24 DIAGNOSIS — I2699 Other pulmonary embolism without acute cor pulmonale: Secondary | ICD-10-CM | POA: Insufficient documentation

## 2010-06-24 MED ORDER — ESOMEPRAZOLE MAGNESIUM 40 MG PO CPDR: 40.0000 mg | DELAYED_RELEASE_CAPSULE | Freq: Every day | ORAL | Status: DC

## 2010-06-24 NOTE — Progress Notes (Signed)
  Subjective:    Patient ID: [name], female    DOB: 08-26-1927, 77 y.o.   MRN: [id number]  HPI Patient seen for medical followup. She has chronic problems including history of hyperlipidemia, GERD, osteoarthritis involving multiple joints, chronic low back pain and chronic urge urinary incontinence. She had recent left shoulder arthroplasty in March. Unfortunately developed complication of postoperative pulmonary embolus a few days after surgery. Treated with Coumadin and went to rehabilitation. Just discharged home last week. She has home physical therapy. Coumadin regimen is 2.5 mg Monday and Friday and 5 mg all other days. Denies bleeding complications. Compliant with Coumadin therapy.  Other medications are reviewed. She needs refills of Nexium. She occasionally skips doses. Has had some recurrent symptoms off her medication. She has history of some chronic urine incontinence which is improved with [place].  She has some chronic constipation which is unchanged.  Chronic low back pain. She sees orthopedist regarding that. On hydrocodone and apparently taking twice daily. She initially requested refills for months but explained this needs to be through one provider.  She's had significant weight gain since her surgery. She attributes this to less activity.   Review of Systems  Constitutional: Positive for fatigue. Negative for fever, activity change and appetite change.  HENT: Negative for trouble swallowing.   Respiratory: Negative for cough and shortness of breath.   Cardiovascular: Negative for chest pain, palpitations and leg swelling.  Gastrointestinal: Negative for abdominal pain and blood in stool.  Genitourinary: Negative for dysuria.  Neurological: Negative for dizziness and syncope.  Psychiatric/Behavioral: Negative for dysphoric mood.       Objective:   Physical Exam  Constitutional: She is oriented to person, place, and time. She appears well-developed and  well-nourished.  HENT:  Head: Normocephalic and atraumatic.  Right Ear: External ear normal.  Left Ear: External ear normal.  Mouth/Throat: Oropharynx is clear and moist. No oropharyngeal exudate.  Neck: Neck supple.  Cardiovascular: Normal rate, regular rhythm and normal heart sounds.   Pulmonary/Chest: Effort normal and breath sounds normal. No respiratory distress. She has no wheezes. She has no rales.  Musculoskeletal: She exhibits no edema.  Lymphadenopathy:    She has no cervical adenopathy.  Neurological: She is alert and oriented to person, place, and time. No cranial nerve deficit.  Psychiatric: She has a normal mood and affect.          Assessment & Plan:  #1 recent left shoulder arthroplasty. Needs ongoing physical therapy and order signed #2 GERD stable on Nexium. Refills given for one year #3 chronic urine incontinence. Continue current medication #4 obesity. Recent weight gain likely due to his decreased activity. Work on calorie restriction and increase walking and activity #5 pulmonary embolus following surgery. INR 4.9 today. Hold Coumadin for 2 days and recheck Friday and we'll need to adjust dosage of that time

## 2010-06-24 NOTE — Patient Instructions (Signed)
Do not take it for 2 days and  check it on friday

## 2010-06-26 ENCOUNTER — Ambulatory Visit (INDEPENDENT_AMBULATORY_CARE_PROVIDER_SITE_OTHER): Payer: Medicare Other | Admitting: Family Medicine

## 2010-06-26 DIAGNOSIS — I2699 Other pulmonary embolism without acute cor pulmonale: Secondary | ICD-10-CM

## 2010-07-03 ENCOUNTER — Ambulatory Visit (INDEPENDENT_AMBULATORY_CARE_PROVIDER_SITE_OTHER): Payer: Medicare Other | Admitting: Family Medicine

## 2010-07-03 DIAGNOSIS — I2699 Other pulmonary embolism without acute cor pulmonale: Secondary | ICD-10-CM

## 2010-07-03 LAB — POCT INR: INR: 4.5

## 2010-07-03 NOTE — Assessment & Plan Note (Signed)
[facility]                         GASTROENTEROLOGY OFFICE NOTE   NAME:Ketron, JENILYN MAGANA                 MRN:          [id number]  DATE:06/08/2006                            DOB:          [birth year]    PROBLEM:  Dysphagia.   Ms. Paolella has returned following esophageal dilatation for  recurrent stricture.  This was performed on May 04, 2006.  Since that  time, her dysphagia has resolved.  She does complain of frequent  pyrosis.  She is currently on no PPI therapy.  She also complains of  mild constipation.  She has a history of colon polyps, and was last  examined in 2003, which demonstrated hemorrhoids only.   EXAMINATION:  Pulse 80.  Blood pressure 118/72.  Weight 170.   IMPRESSION:  1. Esophageal stricture.  Asymptomatic following dilatation therapy.  2. Gastroesophageal reflux disease.  3. History of colon polyps.   RECOMMENDATIONS:  1. Resume Protonix 40 mg a day.  2. Followup colonoscopy.     Barbette Hair. Arlyce Dice, MD,FACG  Electronically Signed    RDK/MedQ  DD: 06/08/2006  DT: 06/08/2006  Job #: [id number]   cc:   Gloriajean Dell. Andrey Campanile, M.D.

## 2010-07-10 ENCOUNTER — Ambulatory Visit: Payer: Medicare Other

## 2010-07-10 DIAGNOSIS — I2699 Other pulmonary embolism without acute cor pulmonale: Secondary | ICD-10-CM

## 2010-07-10 NOTE — Patient Instructions (Signed)
5 mg only tonight then 5mg  on fridays,2.5mg  on other days,check in 2 weeks

## 2010-07-24 ENCOUNTER — Telehealth: Payer: Self-pay | Admitting: Family Medicine

## 2010-07-24 ENCOUNTER — Ambulatory Visit (INDEPENDENT_AMBULATORY_CARE_PROVIDER_SITE_OTHER): Payer: Medicare Other | Admitting: Family Medicine

## 2010-07-24 DIAGNOSIS — I2699 Other pulmonary embolism without acute cor pulmonale: Secondary | ICD-10-CM

## 2010-07-24 NOTE — Telephone Encounter (Signed)
Pt has appt with Dr Ethelene Hal on June 22.  Please advise

## 2010-07-24 NOTE — Patient Instructions (Signed)
Hold today only. Then take 2.5mg  every day.

## 2010-07-24 NOTE — Telephone Encounter (Signed)
Pt. Needs to get a nerve block in her back but needs to be off of Coumadin for 5 days in order to do so. Please call pt. back

## 2010-07-26 NOTE — Telephone Encounter (Signed)
She is far out enough from her dx of pulm embolus that she should be OK at this time. She will need to go back on coumadin as soon as permitted by Dr Ethelene Hal after her nerve block.

## 2010-07-27 NOTE — Telephone Encounter (Signed)
Pt informed and she voiced her understanding 

## 2010-08-14 ENCOUNTER — Other Ambulatory Visit: Payer: Self-pay | Admitting: *Deleted

## 2010-08-14 ENCOUNTER — Ambulatory Visit (INDEPENDENT_AMBULATORY_CARE_PROVIDER_SITE_OTHER): Payer: Medicare Other | Admitting: Family Medicine

## 2010-08-14 DIAGNOSIS — I2699 Other pulmonary embolism without acute cor pulmonale: Secondary | ICD-10-CM

## 2010-08-14 LAB — POCT INR: INR: 2.6

## 2010-08-14 MED ORDER — WARFARIN SODIUM 2.5 MG PO TABS: 2.5000 mg | ORAL_TABLET | Freq: Every day | ORAL | Status: DC

## 2010-08-14 NOTE — Telephone Encounter (Signed)
Pt requesting refill of Gabapentin 300 mg at [facility].  I did not see that on pt med list.  Pt states Dr Jeanne Ivan started her on the to help her sleep.  Pt was instructed to request refill from Dr Jeanne Ivan.  Will add to her med list

## 2010-08-14 NOTE — Patient Instructions (Signed)
Take 2.5gm. Every day,ckeck in 4 weeks

## 2010-08-18 ENCOUNTER — Telehealth: Payer: Self-pay | Admitting: *Deleted

## 2010-08-18 NOTE — Telephone Encounter (Signed)
Pt was informed she is to take 2.5 mg daily, she has 5mg  tab bottle in her hand, so she was told to take 1/2 tab daily repeat INR in 4 week..  Pt has a lab visit scheduled for 7/27 at 3:30pm for INR only.  Pt informed she needs return OV, so we will change that days appt to a doctor visit, please cancel lab appt.

## 2010-08-18 NOTE — Telephone Encounter (Signed)
Pt calls stating she is confused on her Coumadin dosage and needs to speak to [facility] about this, please.

## 2010-09-11 ENCOUNTER — Ambulatory Visit (INDEPENDENT_AMBULATORY_CARE_PROVIDER_SITE_OTHER): Payer: Medicare Other | Admitting: Family Medicine

## 2010-09-11 ENCOUNTER — Encounter: Payer: Self-pay | Admitting: Family Medicine

## 2010-09-11 ENCOUNTER — Ambulatory Visit: Payer: Medicare Other

## 2010-09-11 VITALS — BP 130/90 | Temp 98.3°F | Wt 165.0 lb

## 2010-09-11 DIAGNOSIS — I2699 Other pulmonary embolism without acute cor pulmonale: Secondary | ICD-10-CM

## 2010-09-11 DIAGNOSIS — K219 Gastro-esophageal reflux disease without esophagitis: Secondary | ICD-10-CM

## 2010-09-11 LAB — POCT INR: INR: 2.4

## 2010-09-11 NOTE — Progress Notes (Signed)
  Subjective:    Patient ID: [name], female    DOB: 07-23-1927, 77 y.o.   MRN: [id number]  HPI Patient seen for medical followup. She has osteoarthritis involving multiple joints. She has history of goiter, hyperlipidemia, GERD, chronic low back pain and pulmonary embolus following right shoulder arthroplasty back in March. No prior history of blood clot issues. She remains on Coumadin 2.5 mg daily and protimes have been therapeutic. She has general deconditioning but has not had any dyspnea or chest pain. Other than some mild bruising on the forearms no bleeding complications.   Review of Systems  Constitutional: Negative for appetite change and unexpected weight change.  Respiratory: Negative for cough, shortness of breath and wheezing.   Cardiovascular: Negative for chest pain, palpitations and leg swelling.  Gastrointestinal: Negative for blood in stool.  Genitourinary: Negative for hematuria.  Neurological: Negative for dizziness and headaches.       Objective:   Physical Exam  Constitutional: She is oriented to person, place, and time. She appears well-developed and well-nourished.  HENT:  Mouth/Throat: Oropharynx is clear and moist.  Neck: Neck supple.  Cardiovascular: Normal rate and regular rhythm.  Exam reveals no gallop.   Pulmonary/Chest: Effort normal and breath sounds normal. No respiratory distress. She has no wheezes. She has no rales.  Musculoskeletal: She exhibits no edema.  Lymphadenopathy:    She has no cervical adenopathy.  Neurological: She is alert and oriented to person, place, and time.          Assessment & Plan:   #1 history of pulmonary embolus following right shoulder surgery back in March. Pro time therapeutic today. Continue Coumadin for a minimum of 6 months and can consider DC at that time return in one month for pro time #2 osteoarthritis involving multiple joints #3 GERD stable on Nexium

## 2010-10-16 ENCOUNTER — Ambulatory Visit (INDEPENDENT_AMBULATORY_CARE_PROVIDER_SITE_OTHER): Payer: Medicare Other | Admitting: Family Medicine

## 2010-10-16 DIAGNOSIS — I2699 Other pulmonary embolism without acute cor pulmonale: Secondary | ICD-10-CM

## 2010-10-16 LAB — POCT INR: INR: 1.8

## 2010-10-16 NOTE — Patient Instructions (Signed)
5mg . Friday all other days 2.5mg .

## 2010-10-27 ENCOUNTER — Encounter: Payer: Self-pay | Admitting: Family Medicine

## 2010-10-27 ENCOUNTER — Ambulatory Visit (INDEPENDENT_AMBULATORY_CARE_PROVIDER_SITE_OTHER): Payer: Medicare Other | Admitting: Family Medicine

## 2010-10-27 DIAGNOSIS — I2699 Other pulmonary embolism without acute cor pulmonale: Secondary | ICD-10-CM

## 2010-10-27 DIAGNOSIS — Z23 Encounter for immunization: Secondary | ICD-10-CM

## 2010-10-27 DIAGNOSIS — M159 Polyosteoarthritis, unspecified: Secondary | ICD-10-CM

## 2010-10-27 DIAGNOSIS — M545 Low back pain: Secondary | ICD-10-CM

## 2010-10-27 NOTE — Progress Notes (Signed)
  Subjective:    Patient ID: [name], female    DOB: 1927-09-21, 77 y.o.   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 Past medical history significant for GERD, hyperlipidemia, osteoarthritis, chronic lumbar back pain, urge urine incontinence and history of pulmonary embolism. She had recently shoulder replacement complicated by a postoperative pulmonary embolus.  She has done well from that standpoint currently on Coumadin. She had good recovery with regard to left shoulder range of motion and strength.  Chronic low back pain and complications from lumbar disc surgery in 1989. She does have some chronic weakness left lower extremity. She is able to ambulate with a walker and recently passed her driving test. She has continued to drive over the years and has not required any assistive devices.  She has problems with close up vision and has appointment with ophthalmologist in a couple of weeks. She's never had any neurologic deficits otherwise such as seizure. No history of diabetes. No heart problems. No cognitive impairment.   Review of Systems  Constitutional: Negative for fever, chills and unexpected weight change.  Respiratory: Negative for cough and shortness of breath.   Cardiovascular: Negative for chest pain, palpitations and leg swelling.  Gastrointestinal: Negative for abdominal pain.  Musculoskeletal: Positive for back pain. Negative for joint swelling.  Neurological: Negative for dizziness, seizures, syncope and headaches.  Psychiatric/Behavioral: Negative for confusion.       Objective:   Physical Exam  Constitutional: She is oriented to person, place, and time. She appears well-developed and well-nourished. No distress.  Neck: Normal range of motion. Neck supple. No thyromegaly present.  Cardiovascular: Normal rate and regular rhythm.   Pulmonary/Chest: Effort normal and breath sounds normal. No respiratory distress.  She has no wheezes. She has no rales.  Musculoskeletal:       Patient has some weakness with left hip flexion compared to right and plantar flexion and dorsiflexion left compared to right. Good range of motion left knee. She has full range of motion right upper and lower extremity. She has about 90% range of motion left shoulder with only mild weakness. Good range of motion neck  Lymphadenopathy:    She has no cervical adenopathy.  Neurological: She is alert and oriented to person, place, and time. No cranial nerve deficit.  Psychiatric: She has a normal mood and affect. Her behavior is normal. Judgment and thought content normal.          Assessment & Plan:  Patient here for Department of Transportation driving assessment. History of some chronic low back pain and recent left shoulder replacement. We do not see any contraindications for driving at this time with restrictions on speed limit, distance, and daytime driving only. Forms were completed. She is encouraged to followup with ophthalmologist regarding eye portion of her exam.

## 2010-11-13 ENCOUNTER — Ambulatory Visit: Payer: Medicare Other

## 2010-11-13 DIAGNOSIS — I2699 Other pulmonary embolism without acute cor pulmonale: Secondary | ICD-10-CM

## 2010-11-13 NOTE — Patient Instructions (Signed)
  Latest dosing instructions   Total Sun Mon Tue Wed Thu Fri Sat   12.5 2.5 mg     5 mg 5 mg    (5 mg0.5)     (5 mg1) (5 mg1)

## 2010-11-16 ENCOUNTER — Ambulatory Visit (INDEPENDENT_AMBULATORY_CARE_PROVIDER_SITE_OTHER): Payer: Medicare Other | Admitting: Family Medicine

## 2010-11-16 DIAGNOSIS — I2699 Other pulmonary embolism without acute cor pulmonale: Secondary | ICD-10-CM

## 2010-11-16 NOTE — Patient Instructions (Signed)
  Latest dosing instructions   Total Sun Mon Tue Wed Thu Fri Sat   22.5 2.5 mg 2.5 mg 2.5 mg 2.5 mg 2.5 mg 5 mg 5 mg    (5 mg0.5) (5 mg0.5) (5 mg0.5) (5 mg0.5) (5 mg0.5) (5 mg1) (5 mg1)

## 2010-11-30 ENCOUNTER — Other Ambulatory Visit (INDEPENDENT_AMBULATORY_CARE_PROVIDER_SITE_OTHER): Payer: Medicare Other | Admitting: Family Medicine

## 2010-11-30 DIAGNOSIS — I2699 Other pulmonary embolism without acute cor pulmonale: Secondary | ICD-10-CM

## 2010-11-30 NOTE — Patient Instructions (Signed)
  Latest dosing instructions   Total Sun Mon Tue Wed Thu Fri Sat   17.5 2.5 mg 2.5 mg 2.5 mg 2.5 mg 2.5 mg 2.5 mg 2.5 mg    (2.5 mg1) (2.5 mg1) (2.5 mg1) (2.5 mg1) (2.5 mg1) (2.5 mg1) (2.5 mg1)        

## 2010-12-11 ENCOUNTER — Ambulatory Visit (INDEPENDENT_AMBULATORY_CARE_PROVIDER_SITE_OTHER): Payer: Medicare Other | Admitting: Family Medicine

## 2010-12-11 ENCOUNTER — Encounter: Payer: Self-pay | Admitting: Family Medicine

## 2010-12-11 DIAGNOSIS — I2699 Other pulmonary embolism without acute cor pulmonale: Secondary | ICD-10-CM

## 2010-12-11 DIAGNOSIS — R609 Edema, unspecified: Secondary | ICD-10-CM

## 2010-12-11 MED ORDER — HYDROCHLOROTHIAZIDE 25 MG PO TABS: 25.0000 mg | ORAL_TABLET | Freq: Every day | ORAL | Status: DC

## 2010-12-11 NOTE — Progress Notes (Signed)
  Subjective:    Patient ID: [name], female    DOB: 02/08/1928, 77 y.o.   MRN: [id number]  HPI  Medical followup.  Patient has history of hyperlipidemia, GERD, osteoarthritis, chronic low back pain, and pulmonary emboli. No prior history of coagulopathy until 7 months ago. 2 days following left shoulder replacement she had pulmonary emboli. She's been on Coumadin since then with no difficulties. We discussed possible discontinuation after 6 months of therapy since she has not had any prior history of thrombosis and pulmonary embolus occurred in the postoperative setting.  She's had some lower extremity edema over the past couple of months. No dyspnea. No history of CHF. Previously treated with HCTZ but has not been on this in quite some time. Blood pressures borderline high. She does apparently take some gabapentin for her pain which may be exacerbating her edema. No orthopnea.  Review of Systems  Constitutional: Negative for fever, chills and unexpected weight change.  Respiratory: Negative for cough, shortness of breath and wheezing.   Cardiovascular: Positive for leg swelling. Negative for chest pain.  Neurological: Negative for dizziness, syncope and headaches.  Psychiatric/Behavioral: Negative for dysphoric mood.       Objective:   Physical Exam  Constitutional: She is oriented to person, place, and time. She appears well-developed and well-nourished.  Neck: Neck supple. No thyromegaly present.  Cardiovascular: Normal rate and regular rhythm.   Pulmonary/Chest: Effort normal and breath sounds normal. No respiratory distress. She has no wheezes. She has no rales.  Musculoskeletal: She exhibits edema.       Patient has mild nonpitting edema ankles feet and lower legs bilaterally  Lymphadenopathy:    She has no cervical adenopathy.  Neurological: She is alert and oriented to person, place, and time.          Assessment & Plan:  #1 history of pulmonary embolus. This  occurred postoperatively and was only episode previously of thrombosis. Discontinue Coumadin this time. In 2 days she will start aspirin 325 mg daily. Discussed importance of increased walking and ambulation. #2 peripheral edema. Most likely related to venous stasis. Elevate legs and feet frequently. Start back on thiazide 25 mg one half tablet daily #3 elevated blood pressure. HCTZ as above. Potassium rich diet. Reassess one month and check basic metabolic panel then.

## 2010-12-11 NOTE — Patient Instructions (Signed)
Start HCTZ 25 mg one half tablet daily Elevate feet and legs frequently Stop coumadin and start aspirin 325 mg one daily

## 2011-01-11 ENCOUNTER — Encounter: Payer: Self-pay | Admitting: Family Medicine

## 2011-01-11 ENCOUNTER — Ambulatory Visit (INDEPENDENT_AMBULATORY_CARE_PROVIDER_SITE_OTHER): Payer: Medicare Other | Admitting: Family Medicine

## 2011-01-11 VITALS — BP 128/80 | Temp 98.3°F | Wt 161.0 lb

## 2011-01-11 DIAGNOSIS — R5383 Other fatigue: Secondary | ICD-10-CM

## 2011-01-11 DIAGNOSIS — E785 Hyperlipidemia, unspecified: Secondary | ICD-10-CM | POA: Insufficient documentation

## 2011-01-11 DIAGNOSIS — I10 Essential (primary) hypertension: Secondary | ICD-10-CM

## 2011-01-11 DIAGNOSIS — R5381 Other malaise: Secondary | ICD-10-CM

## 2011-01-11 DIAGNOSIS — D649 Anemia, unspecified: Secondary | ICD-10-CM

## 2011-01-11 LAB — CBC WITH DIFFERENTIAL/PLATELET
Eosinophils Absolute: 0 10*3/uL (ref 0.0–0.7)
Eosinophils Relative: 0.9 % (ref 0.0–5.0)
MCV: 82.7 fl (ref 78.0–100.0)
Monocytes Absolute: 0.5 10*3/uL (ref 0.1–1.0)
Neutrophils Relative %: 38.4 % — ABNORMAL LOW (ref 43.0–77.0)
Platelets: 311 10*3/uL (ref 150.0–400.0)
WBC: 4.2 10*3/uL — ABNORMAL LOW (ref 4.5–10.5)

## 2011-01-11 LAB — HEPATIC FUNCTION PANEL
AST: 22 U/L (ref 0–37)
Total Bilirubin: 0.5 mg/dL (ref 0.3–1.2)

## 2011-01-11 LAB — BASIC METABOLIC PANEL
CO2: 22 mEq/L (ref 19–32)
Chloride: 108 mEq/L (ref 96–112)
GFR: 92.93 mL/min (ref >60.00)
Glucose, Bld: 90 mg/dL (ref 70–99)
Potassium: 4 mEq/L (ref 3.5–5.1)
Sodium: 140 mEq/L (ref 135–145)

## 2011-01-11 LAB — LIPID PANEL
HDL: 50.5 mg/dL (ref >39.00)
VLDL: 23.4 mg/dL (ref 0.0–40.0)

## 2011-01-11 LAB — TSH: TSH: 1.29 u[IU]/mL (ref 0.35–5.50)

## 2011-01-11 NOTE — Patient Instructions (Signed)
Dupuytren's Contracture Dupuytren's contracture affects the fingers and the palm of the hand. This condition usually develops slowly. It may take many years to develop. The pinky finger and the ring finger are most often affected. These fingers start to curve inward, like a claw. At some point, the fingers cannot go straight anymore. This can make it hard to do things like:  Put on gloves.   Shake hands.   Grab something off a shelf.  The condition usually does not cause pain and is not dangerous. The condition gets its name from the doctor who came up with an operation to fix the problem. His name was Baron Guillaume Dupuytren. Contracture means pulling inward. CAUSES  Dupuytren's contracture does not start with the fingers. It starts in the palm of the hand, under the skin. The tissue under the skin is called fascia. The fascia covers the cords (tendons) that control how the fingers move. In Dupuytren's contracture the fascia tissue becomes thick and then pulls on the cords. That causes the fingers to curl. The condition can affect both hands and any fingers, but it usually strikes one hand worse than the other. The fingers farthest from the thumb are most often the ones that curl. The cause is not clear. Some experts believe it results from an autoimmune reaction. That means the body's immune system (which fights off disease) attacks itself by mistake. What experts do know is that certain conditions and behaviors (called risk factors) make the chance of having this condition more likely. They include:  Age. Most people who have the condition are older than 50.   Sex. It affects men more often than women.   Family history. The condition tends to run in families from countries in [place] and [place].   Certain behaviors. People who smoke and drink alcohol are more apt to develop the problem.   Some other medical conditions. Having diabetes makes Dupuytren's contracture more likely.  So does having a condition that involves a seizure (when the brain's function is interrupted).  SYMPTOMS  Signs of this condition take time to develop. Sometimes this takes weeks or months. More often, it takes several years.   Early symptoms:   Skin on the palm of the hand becomes thick. This is usually the first sign.   The skin may look dimpled or puckered.   Lumps (nodules) show up on the palm. There may be one or more lumps. They are not painful.   Later symptoms:   Thick cords of tissue form in the palm of the hand.   The pinky and ring fingers start to curl up into the palm.   The fingers cannot be straightened into their normal position.  DIAGNOSIS  A physical examination is the main way that a healthcare provider can tell if you have Dupuytren's contracture. Other tests usually are not needed. The caregiver will probably:  Look at your hands. Feel your hands. This is to check for thickening and nodules.   Measure finger motion. This tells how much your fingers have contracted (pulled in).   Do a tabletop test. You will be asked to try to put your hand flat on a table, palm down.  TREATMENT  There is no cure for Dupuytren's contracture. But there are ways to treat the symptoms. Options include:  Watching and waiting. The condition develops slowly. Often it does not create problems for a long time. Sometimes the skin gets thick and nodules form, but the fingers never curl. So,   in some cases it is best to just watch the condition carefully and wait to see what happens.   Shots (injections). Different substances may be injected, including:   Steroids. These drugs block swelling. These shots should make the condition less uncomfortable. Steroids may also slow down the condition. Shots are given into the nodules. The effect only lasts awhile. More shots may have to be given.   Enzymes. These are proteins. They weaken the thick tissue. After an injection, the caregiver usually  stretches the fingers.   Needling. A needle is pushed through the skin and into the thick tissue. This is done in several spots. The goal is to break up the thickened tissue. Or to weaken it.   Surgery. This may be suggested if you cannot grasp objects. Or, if you can no longer put your hand in your pocket.   A cut (incision) is made in the palm of the hand. The thick tissue is removed.   Sometimes the thick tissue is attached to the skin. Then, the skin must be removed, too. It is replaced with a piece of skin from another place on your body. That is called a skin graft.   Occupational or hand therapy is almost always needed after surgery. This involves special exercises to get back the use of your hand and fingers. After a skin graft, several months of therapy may be needed.   Sometimes the condition comes back, even after surgery.   Other methods. You can do some things on your own. They include:   Stretching the fingers backwards. Do this often.   Warming the hand and massaging it. Again, do this often.   Using tools with padded grips. This should make things easier.   Wearing heavy gloves while working. This protects the hands.  PROGNOSIS  Dupuytren's contracture usually develops slowly. There is no cure. But, the symptoms can be treated. Sometimes they come back after treatment, but not always. It is important to remember that this is a functional problem and not a life-threatening condition. Document Released: 11/29/2008 Document Revised: 10/14/2010 Document Reviewed: 11/29/2008 [employer] Patient Information 2012 [employer]. 

## 2011-01-11 NOTE — Progress Notes (Signed)
  Subjective:    Patient ID: [name], female    DOB: Jun 22, 1927, 77 y.o.   MRN: [id number]  HPI  Followup multiple medical issues. History of elevated blood pressure and recent increased peripheral edema. Addition of HCTZ and she is seeing improvement both in blood pressure and edema. No side effects. She does have some chronic fatigue. Anemia following shoulder replacement surgery 8 months ago and no followup since then. No recent thyroid functions. We recently stopped Coumadin following pulmonary embolus which was postsurgical. She is currently taking aspirin and has had no problems.  She has some chronic back pain followed by orthopedists. They have her on oxycodone as needed. She's had some recent problems with muscle spasms involving arms and legs at night. Tried Flexeril without much relief.  Past Medical History  Diagnosis Date  . COLONIC POLYPS 02/20/2010  . GOITER 02/20/2010  . HYPERLIPIDEMIA 02/20/2010  . GERD 02/20/2010  . OSTEOARTHRITIS, MULTIPLE JOINTS 02/20/2010  . BACK PAIN, LUMBAR, CHRONIC 02/20/2010  . URINARY INCONTINENCE 02/20/2010  . DIVERTICULITIS, HX OF 02/20/2010  . Cardiomegaly 03/16/2010   Past Surgical History  Procedure Date  . Abdominal hysterectomy   . Tonsillectomy 1936  . Spine surgery     4 back surgeries  . Thyroidectomy, partial     ? goiter, no CA  . Nose surgery 1975    head surgery to repair cracked bone over nose     reports that she has never smoked. She does not have any smokeless tobacco history on file. She reports that she does not drink alcohol. Her drug history not on file. family history includes Cancer in her mother; Diabetes in her mother; and Hyperlipidemia in her mother. No Known Allergies    Review of Systems  Constitutional: Positive for fatigue. Negative for fever, chills and appetite change.  Eyes: Negative for visual disturbance.  Respiratory: Negative for cough and shortness of breath.   Cardiovascular: Negative for chest  pain, palpitations and leg swelling.  Gastrointestinal: Negative for abdominal pain.  Neurological: Positive for weakness. Negative for dizziness and syncope.  Psychiatric/Behavioral: Negative for dysphoric mood.       Objective:   Physical Exam  Constitutional: She is oriented to person, place, and time. She appears well-developed and well-nourished. No distress.  Neck: Neck supple.  Cardiovascular: Normal rate and regular rhythm.   Pulmonary/Chest: Effort normal and breath sounds normal. No respiratory distress. She has no wheezes. She has no rales.  Musculoskeletal: She exhibits no edema.  Lymphadenopathy:    She has no cervical adenopathy.  Neurological: She is alert and oriented to person, place, and time.          Assessment & Plan:  #1 hypertension improved. Also improvement in peripheral edema. Continue HCTZ. Check basic metabolic panel #2 history of anemia related to prior surgery. Recheck CBC  #3 fatigue. Probably multifactorial. Check TSH #4 history of hyperlipidemia. Check lipid and hepatic panel

## 2011-01-13 NOTE — Progress Notes (Signed)
Quick Note:  Pt son informed ______ 

## 2011-01-28 ENCOUNTER — Other Ambulatory Visit: Payer: Self-pay | Admitting: *Deleted

## 2011-01-28 MED ORDER — HYDROCHLOROTHIAZIDE 25 MG PO TABS: 25.0000 mg | ORAL_TABLET | Freq: Every day | ORAL | Status: DC

## 2011-01-28 MED ORDER — CYCLOBENZAPRINE HCL 10 MG PO TABS: 10.0000 mg | ORAL_TABLET | Freq: Every day | ORAL | Status: DC

## 2011-03-19 ENCOUNTER — Other Ambulatory Visit: Payer: Self-pay | Admitting: *Deleted

## 2011-03-19 MED ORDER — GABAPENTIN 300 MG PO CAPS: 300.0000 mg | ORAL_CAPSULE | Freq: Every day | ORAL | Status: DC

## 2011-05-03 ENCOUNTER — Encounter: Payer: Self-pay | Admitting: Gastroenterology

## 2011-05-13 DIAGNOSIS — M19019 Primary osteoarthritis, unspecified shoulder: Secondary | ICD-10-CM | POA: Diagnosis not present

## 2011-05-13 DIAGNOSIS — M1710 Unilateral primary osteoarthritis, unspecified knee: Secondary | ICD-10-CM | POA: Diagnosis not present

## 2011-06-17 ENCOUNTER — Telehealth: Payer: Self-pay | Admitting: Family Medicine

## 2011-06-17 MED ORDER — TROSPIUM CHLORIDE ER 60 MG PO CP24: 60.0000 mg | ORAL_CAPSULE | Freq: Every day | ORAL | Status: DC

## 2011-06-17 NOTE — Telephone Encounter (Signed)
Pt requesting refill on Trospium Chloride (SANCTURA XR) 60 MG CP24  [facility]

## 2011-06-28 DIAGNOSIS — M19019 Primary osteoarthritis, unspecified shoulder: Secondary | ICD-10-CM | POA: Diagnosis not present

## 2011-06-28 DIAGNOSIS — M1710 Unilateral primary osteoarthritis, unspecified knee: Secondary | ICD-10-CM | POA: Diagnosis not present

## 2011-07-05 ENCOUNTER — Encounter: Payer: Self-pay | Admitting: Family Medicine

## 2011-07-05 ENCOUNTER — Ambulatory Visit (INDEPENDENT_AMBULATORY_CARE_PROVIDER_SITE_OTHER): Payer: Medicare Other | Admitting: Family Medicine

## 2011-07-05 VITALS — BP 130/90 | HR 82 | Temp 98.0°F | Resp 20 | Ht 60.0 in | Wt 154.0 lb

## 2011-07-05 DIAGNOSIS — I2699 Other pulmonary embolism without acute cor pulmonale: Secondary | ICD-10-CM | POA: Diagnosis not present

## 2011-07-05 DIAGNOSIS — Z01818 Encounter for other preprocedural examination: Secondary | ICD-10-CM

## 2011-07-05 NOTE — Progress Notes (Signed)
  Subjective:    Patient ID: [name], female    DOB: 01/18/1928, 78 y.o.   MRN: [id number]  HPI  Patient here for surgical clearance for total right shoulder replacement. She had similar surgery left shoulder back last year. Post operative complication of pulmonary embolus. She was maintained on Coumadin for several months and is now back on aspirin. No history of confirmed DVT. No other coagulopathy events.She has no cardiac history. No pulmonary problems. Nonsmoker. Denies any recent chest pains or dyspnea. She has had occasional dry cough past couple of weeks. No fever or chills. Date for surgery has not yet been set.  Other problems include history of mild hyperlipidemia, GERD, osteoarthritis involving multiple joints, urinary incontinence  Past Medical History  Diagnosis Date  . COLONIC POLYPS 02/20/2010  . GOITER 02/20/2010  . HYPERLIPIDEMIA 02/20/2010  . GERD 02/20/2010  . OSTEOARTHRITIS, MULTIPLE JOINTS 02/20/2010  . BACK PAIN, LUMBAR, CHRONIC 02/20/2010  . URINARY INCONTINENCE 02/20/2010  . DIVERTICULITIS, HX OF 02/20/2010  . Cardiomegaly 03/16/2010   Past Surgical History  Procedure Date  . Abdominal hysterectomy   . Tonsillectomy 1936  . Spine surgery     4 back surgeries  . Thyroidectomy, partial     ? goiter, no CA  . Nose surgery 1975    head surgery to repair cracked bone over nose     reports that she has never smoked. She does not have any smokeless tobacco history on file. She reports that she does not drink alcohol. Her drug history not on file. family history includes Cancer in her mother; Diabetes in her mother; and Hyperlipidemia in her mother. No Known Allergies    Review of Systems  Constitutional: Negative for fever, appetite change and unexpected weight change.  HENT: Negative for congestion.   Respiratory: Positive for cough. Negative for shortness of breath and wheezing.   Cardiovascular: Negative for chest pain, palpitations and leg swelling.    Gastrointestinal: Negative for nausea, vomiting and abdominal pain.  Genitourinary: Negative for dysuria.  Neurological: Negative for dizziness and syncope.  Hematological: Negative for adenopathy.       Objective:   Physical Exam  Constitutional: She appears well-developed and well-nourished.  HENT:  Mouth/Throat: Oropharynx is clear and moist.  Neck: Neck supple. No thyromegaly present.  Cardiovascular: Normal rate and regular rhythm.   Pulmonary/Chest: Effort normal and breath sounds normal. No respiratory distress. She has no wheezes. She has no rales.  Musculoskeletal: She exhibits no edema.  Lymphadenopathy:    She has no cervical adenopathy.          Assessment & Plan:  Presurgical clearance. Prior history of pulmonary embolus following previous left shoulder surgery. Special cautions will need to occur to prevent repeat occurrence. Suggest consideration for Lovenox. Peri-Operative.. Early ambulation. Patient will need to discontinue aspirin one week prior to surgery. Check EKG

## 2011-07-19 ENCOUNTER — Encounter (HOSPITAL_COMMUNITY): Payer: Self-pay | Admitting: Respiratory Therapy

## 2011-07-19 MED ORDER — HYDROCODONE-ACETAMINOPHEN 5-325 MG PO TABS
ORAL_TABLET | ORAL | Status: AC
  Filled 2011-07-19: qty 1

## 2011-07-22 ENCOUNTER — Encounter (HOSPITAL_COMMUNITY): Payer: Self-pay | Admitting: *Deleted

## 2011-07-22 ENCOUNTER — Inpatient Hospital Stay (HOSPITAL_COMMUNITY): Admission: RE | Admit: 2011-07-22 | Payer: Medicare Other | Source: Ambulatory Visit

## 2011-07-22 NOTE — Pre-Procedure Instructions (Signed)
20 [name]  07/22/2011   Your procedure is scheduled on:  July 30, 2011 at 0730 AM Friday  Report to [facility] at 0530AM.  Call this number if you have problems the morning of surgery: [phone]   Remember:   Do not eat food:After Midnight.  May have clear liquids: up to 4 Hours before arrival.0130 AM  Clear liquids include soda, tea, black coffee, apple or grape juice, broth.  Take these medicines the morning of surgery with A SIP OF WATER: Nexium, Ultram   Do not wear jewelry, make-up or nail polish.  Do not wear lotions, powders, or perfumes. You may wear deodorant.  Do not shave 48 hours prior to surgery. Men may shave face and neck.  Do not bring valuables to the hospital.  Contacts, dentures or bridgework may not be worn into surgery.  Leave suitcase in the car. After surgery it may be brought to your room.  For patients admitted to the hospital, checkout time is 11:00 AM the day of discharge.   Patients discharged the day of surgery will not be allowed to drive home.  Name and phone number of your driver: Spouse  Special Instructions: Incentive Spirometry - Practice and bring it with you on the day of surgery. and CHG Shower Use Special Wash: 1/2 bottle night before surgery and 1/2 bottle morning of surgery.   Please read over the following fact sheets that you were given: Pain Booklet, Coughing and Deep Breathing, Blood Transfusion Information, MRSA Information and Surgical Site Infection Prevention

## 2011-07-22 NOTE — H&P (Signed)
CC: right shoulder pain and weakness HPI: 78 y/o female with worsening right shoulder pain and weakness due to rotator cuff arthropathy. Pt has elected to have reverse total shoulder to decrease pain and increase function PMH: hx of PE, hyperlipidemia, diverticulitis, GERD, hypertension Social: retired, non smoker, non drinker  Family: diabetes, ovarian cancer ROS: pain and weakness with rom right shoulder, s/p left reverse total shoulder doing well Denies numbness or tingling distally PE: 122/82  74 12  Alert and appropriate 78 y/o female in no acute distress Cervical spine: full rom, non tender, cranial nerves 2-12 grossly intact Right shoulder: FF 70 ER 20 IR 30 nv intact distally No rashes or edema Chest: active breath sounds bilaterally, no wheeze rhonchi or rales Heart: regular rate and rhythm  X-rays: right shoulder rotator cuff arthropathy Assessment: right shoulder rotator cuff arthropathy Plan: reverse total shoulder arthroplasty to decrease pain and increase function

## 2011-07-26 ENCOUNTER — Ambulatory Visit (HOSPITAL_COMMUNITY)
Admission: RE | Admit: 2011-07-26 | Discharge: 2011-07-26 | Disposition: A | Discharge status: Home / Self Care | Payer: Medicare Other | Source: Ambulatory Visit | Attending: Orthopedic Surgery | Admitting: Orthopedic Surgery

## 2011-07-26 ENCOUNTER — Encounter (HOSPITAL_COMMUNITY)
Admission: RE | Admit: 2011-07-26 | Discharge: 2011-07-26 | Disposition: A | Discharge status: Home / Self Care | Payer: Medicare Other | Source: Ambulatory Visit | Attending: Orthopedic Surgery | Admitting: Orthopedic Surgery

## 2011-07-26 DIAGNOSIS — J9819 Other pulmonary collapse: Secondary | ICD-10-CM | POA: Diagnosis not present

## 2011-07-26 DIAGNOSIS — Z01811 Encounter for preprocedural respiratory examination: Secondary | ICD-10-CM | POA: Diagnosis not present

## 2011-07-26 DIAGNOSIS — K449 Diaphragmatic hernia without obstruction or gangrene: Secondary | ICD-10-CM | POA: Diagnosis not present

## 2011-07-26 DIAGNOSIS — Z01812 Encounter for preprocedural laboratory examination: Secondary | ICD-10-CM | POA: Insufficient documentation

## 2011-07-26 DIAGNOSIS — Z01818 Encounter for other preprocedural examination: Secondary | ICD-10-CM | POA: Diagnosis not present

## 2011-07-26 LAB — CBC
HCT: 41.6 % (ref 36.0–46.0)
Platelets: 238 10*3/uL (ref 150–400)
RBC: 4.97 MIL/uL (ref 3.87–5.11)
RDW: 15.8 % — ABNORMAL HIGH (ref 11.5–15.5)
WBC: 6.1 10*3/uL (ref 4.0–10.5)

## 2011-07-26 LAB — TYPE AND SCREEN: Antibody Screen: NEGATIVE

## 2011-07-26 LAB — BASIC METABOLIC PANEL
Chloride: 103 mEq/L (ref 96–112)
Creatinine, Ser: 0.57 mg/dL (ref 0.50–1.10)
GFR calc Af Amer: >90 mL/min (ref >90)
Sodium: 138 mEq/L (ref 135–145)

## 2011-07-26 LAB — SURGICAL PCR SCREEN: MRSA, PCR: NEGATIVE

## 2011-07-29 MED ORDER — SODIUM CHLORIDE 0.9 % IV SOLN: INTRAVENOUS | Status: DC

## 2011-07-29 MED ORDER — CEFAZOLIN SODIUM-DEXTROSE 2-3 GM-% IV SOLR
2.0000 g | INTRAVENOUS | Status: AC
  Administered 2011-07-30: 2 g via INTRAVENOUS
  Filled 2011-07-29: qty 50

## 2011-07-29 MED ORDER — CHLORHEXIDINE GLUCONATE 4 % EX LIQD: 60.0000 mL | Freq: Once | CUTANEOUS | Status: DC

## 2011-07-30 ENCOUNTER — Encounter (HOSPITAL_COMMUNITY): Payer: Self-pay | Admitting: *Deleted

## 2011-07-30 ENCOUNTER — Encounter (HOSPITAL_COMMUNITY): Payer: Self-pay | Admitting: Anesthesiology

## 2011-07-30 ENCOUNTER — Inpatient Hospital Stay (HOSPITAL_COMMUNITY): Payer: Medicare Other

## 2011-07-30 ENCOUNTER — Ambulatory Visit (HOSPITAL_COMMUNITY): Payer: Medicare Other | Admitting: Anesthesiology

## 2011-07-30 ENCOUNTER — Inpatient Hospital Stay (HOSPITAL_COMMUNITY)
Admission: RE | Admit: 2011-07-30 | Discharge: 2011-08-02 | DRG: 484 | Disposition: A | Discharge status: Skilled Nursing Facility | Payer: Medicare Other | Source: Ambulatory Visit | Attending: Orthopedic Surgery | Admitting: Orthopedic Surgery

## 2011-07-30 ENCOUNTER — Encounter (HOSPITAL_COMMUNITY)
Admission: RE | Disposition: A | Discharge status: Skilled Nursing Facility | Payer: Self-pay | Source: Ambulatory Visit | Attending: Orthopedic Surgery

## 2011-07-30 DIAGNOSIS — M25519 Pain in unspecified shoulder: Secondary | ICD-10-CM | POA: Diagnosis not present

## 2011-07-30 DIAGNOSIS — R32 Unspecified urinary incontinence: Secondary | ICD-10-CM | POA: Diagnosis not present

## 2011-07-30 DIAGNOSIS — S4980XA Other specified injuries of shoulder and upper arm, unspecified arm, initial encounter: Secondary | ICD-10-CM | POA: Diagnosis not present

## 2011-07-30 DIAGNOSIS — Z86711 Personal history of pulmonary embolism: Secondary | ICD-10-CM | POA: Diagnosis not present

## 2011-07-30 DIAGNOSIS — M19019 Primary osteoarthritis, unspecified shoulder: Principal | ICD-10-CM | POA: Diagnosis present

## 2011-07-30 DIAGNOSIS — K5730 Diverticulosis of large intestine without perforation or abscess without bleeding: Secondary | ICD-10-CM | POA: Diagnosis not present

## 2011-07-30 DIAGNOSIS — K449 Diaphragmatic hernia without obstruction or gangrene: Secondary | ICD-10-CM | POA: Diagnosis present

## 2011-07-30 DIAGNOSIS — E785 Hyperlipidemia, unspecified: Secondary | ICD-10-CM | POA: Diagnosis not present

## 2011-07-30 DIAGNOSIS — Z471 Aftercare following joint replacement surgery: Secondary | ICD-10-CM | POA: Diagnosis not present

## 2011-07-30 DIAGNOSIS — G8918 Other acute postprocedural pain: Secondary | ICD-10-CM | POA: Diagnosis not present

## 2011-07-30 DIAGNOSIS — I10 Essential (primary) hypertension: Secondary | ICD-10-CM | POA: Diagnosis present

## 2011-07-30 DIAGNOSIS — G589 Mononeuropathy, unspecified: Secondary | ICD-10-CM | POA: Diagnosis not present

## 2011-07-30 DIAGNOSIS — I517 Cardiomegaly: Secondary | ICD-10-CM | POA: Diagnosis not present

## 2011-07-30 DIAGNOSIS — Z96619 Presence of unspecified artificial shoulder joint: Secondary | ICD-10-CM | POA: Diagnosis not present

## 2011-07-30 DIAGNOSIS — K219 Gastro-esophageal reflux disease without esophagitis: Secondary | ICD-10-CM | POA: Diagnosis present

## 2011-07-30 DIAGNOSIS — Z5189 Encounter for other specified aftercare: Secondary | ICD-10-CM | POA: Diagnosis not present

## 2011-07-30 HISTORY — PX: TOTAL SHOULDER ARTHROPLASTY: SHX126

## 2011-07-30 SURGERY — ARTHROPLASTY, SHOULDER, TOTAL
Anesthesia: General | Site: Shoulder | Laterality: Right | Wound class: Clean

## 2011-07-30 MED ORDER — ACETAMINOPHEN 650 MG RE SUPP: 650.0000 mg | Freq: Four times a day (QID) | RECTAL | Status: DC | PRN

## 2011-07-30 MED ORDER — PHENOL 1.4 % MT LIQD: 1.0000 | OROMUCOSAL | Status: DC | PRN

## 2011-07-30 MED ORDER — MORPHINE SULFATE 2 MG/ML IJ SOLN
1.0000 mg | INTRAMUSCULAR | Status: DC | PRN
  Administered 2011-07-31 (×2): 2 mg via INTRAVENOUS
  Filled 2011-07-30 (×2): qty 1

## 2011-07-30 MED ORDER — METHOCARBAMOL 100 MG/ML IJ SOLN
500.0000 mg | Freq: Four times a day (QID) | INTRAVENOUS | Status: DC | PRN
  Filled 2011-07-30: qty 5

## 2011-07-30 MED ORDER — ADULT MULTIVITAMIN W/MINERALS CH
1.0000 | ORAL_TABLET | Freq: Every day | ORAL | Status: DC
  Administered 2011-07-30 – 2011-08-02 (×4): 1 via ORAL
  Filled 2011-07-30 (×4): qty 1

## 2011-07-30 MED ORDER — SODIUM CHLORIDE 0.9 % IR SOLN
Status: DC | PRN
  Administered 2011-07-30: 1000 mL

## 2011-07-30 MED ORDER — ONDANSETRON HCL 4 MG PO TABS: 4.0000 mg | ORAL_TABLET | Freq: Four times a day (QID) | ORAL | Status: DC | PRN

## 2011-07-30 MED ORDER — OMEGA-3-ACID ETHYL ESTERS 1 G PO CAPS
1.0000 g | ORAL_CAPSULE | Freq: Two times a day (BID) | ORAL | Status: DC
  Administered 2011-07-30 – 2011-08-02 (×7): 1 g via ORAL
  Filled 2011-07-30 (×8): qty 1

## 2011-07-30 MED ORDER — ONDANSETRON HCL 4 MG/2ML IJ SOLN: 4.0000 mg | Freq: Four times a day (QID) | INTRAMUSCULAR | Status: DC | PRN

## 2011-07-30 MED ORDER — ACETAMINOPHEN 325 MG PO TABS
650.0000 mg | ORAL_TABLET | Freq: Four times a day (QID) | ORAL | Status: DC | PRN
  Administered 2011-08-01: 650 mg via ORAL
  Filled 2011-07-30: qty 2

## 2011-07-30 MED ORDER — ROCURONIUM BROMIDE 100 MG/10ML IV SOLN
INTRAVENOUS | Status: DC | PRN
  Administered 2011-07-30: 50 mg via INTRAVENOUS

## 2011-07-30 MED ORDER — HYDROCODONE-ACETAMINOPHEN 5-325 MG PO TABS: 1.0000 | ORAL_TABLET | Freq: Four times a day (QID) | ORAL | Status: AC | PRN

## 2011-07-30 MED ORDER — NEOSTIGMINE METHYLSULFATE 1 MG/ML IJ SOLN
INTRAMUSCULAR | Status: DC | PRN
  Administered 2011-07-30: 3 mg via INTRAVENOUS

## 2011-07-30 MED ORDER — PANTOPRAZOLE SODIUM 40 MG PO TBEC
40.0000 mg | DELAYED_RELEASE_TABLET | Freq: Every day | ORAL | Status: DC
  Administered 2011-07-30 – 2011-08-02 (×3): 40 mg via ORAL
  Filled 2011-07-30 (×3): qty 1

## 2011-07-30 MED ORDER — PROPOFOL 10 MG/ML IV EMUL
INTRAVENOUS | Status: DC | PRN
  Administered 2011-07-30: 140 mg via INTRAVENOUS

## 2011-07-30 MED ORDER — CYCLOBENZAPRINE HCL 10 MG PO TABS
10.0000 mg | ORAL_TABLET | Freq: Every day | ORAL | Status: DC
  Administered 2011-07-30 – 2011-08-01 (×3): 10 mg via ORAL
  Filled 2011-07-30 (×4): qty 1

## 2011-07-30 MED ORDER — GABAPENTIN 300 MG PO CAPS
300.0000 mg | ORAL_CAPSULE | Freq: Every day | ORAL | Status: DC
  Administered 2011-07-30 – 2011-08-01 (×3): 300 mg via ORAL
  Filled 2011-07-30 (×4): qty 1

## 2011-07-30 MED ORDER — ASPIRIN 325 MG PO TABS
325.0000 mg | ORAL_TABLET | Freq: Every day | ORAL | Status: DC
  Administered 2011-07-31 – 2011-08-02 (×3): 325 mg via ORAL
  Filled 2011-07-30 (×3): qty 1

## 2011-07-30 MED ORDER — TROSPIUM CHLORIDE ER 60 MG PO CP24: 60.0000 mg | ORAL_CAPSULE | Freq: Every day | ORAL | Status: DC

## 2011-07-30 MED ORDER — METOCLOPRAMIDE HCL 10 MG PO TABS: 5.0000 mg | ORAL_TABLET | Freq: Three times a day (TID) | ORAL | Status: DC | PRN

## 2011-07-30 MED ORDER — DOCUSATE SODIUM 100 MG PO CAPS
100.0000 mg | ORAL_CAPSULE | Freq: Two times a day (BID) | ORAL | Status: DC
  Administered 2011-07-30 – 2011-08-02 (×7): 100 mg via ORAL
  Filled 2011-07-30 (×8): qty 1

## 2011-07-30 MED ORDER — GLYCOPYRROLATE 0.2 MG/ML IJ SOLN
INTRAMUSCULAR | Status: DC | PRN
  Administered 2011-07-30: 0.4 mg via INTRAVENOUS

## 2011-07-30 MED ORDER — ONDANSETRON HCL 4 MG/2ML IJ SOLN
INTRAMUSCULAR | Status: DC | PRN
  Administered 2011-07-30: 4 mg via INTRAVENOUS

## 2011-07-30 MED ORDER — ENOXAPARIN SODIUM 30 MG/0.3ML ~~LOC~~ SOLN
30.0000 mg | Freq: Two times a day (BID) | SUBCUTANEOUS | Status: DC
  Administered 2011-07-31 – 2011-08-02 (×5): 30 mg via SUBCUTANEOUS
  Filled 2011-07-30 (×7): qty 0.3

## 2011-07-30 MED ORDER — METOCLOPRAMIDE HCL 5 MG/ML IJ SOLN: 5.0000 mg | Freq: Three times a day (TID) | INTRAMUSCULAR | Status: DC | PRN

## 2011-07-30 MED ORDER — CEFAZOLIN SODIUM-DEXTROSE 2-3 GM-% IV SOLR
2.0000 g | Freq: Four times a day (QID) | INTRAVENOUS | Status: AC
  Administered 2011-07-30 – 2011-07-31 (×3): 2 g via INTRAVENOUS
  Filled 2011-07-30 (×3): qty 50

## 2011-07-30 MED ORDER — MENTHOL 3 MG MT LOZG: 1.0000 | LOZENGE | OROMUCOSAL | Status: DC | PRN

## 2011-07-30 MED ORDER — TROSPIUM CHLORIDE ER 60 MG PO CP24
60.0000 mg | ORAL_CAPSULE | Freq: Every day | ORAL | Status: DC
  Administered 2011-07-31 – 2011-08-02 (×3): 60 mg via ORAL
  Filled 2011-07-30: qty 1

## 2011-07-30 MED ORDER — HYDROCHLOROTHIAZIDE 25 MG PO TABS
25.0000 mg | ORAL_TABLET | Freq: Every day | ORAL | Status: DC
  Administered 2011-07-30 – 2011-08-02 (×4): 25 mg via ORAL
  Filled 2011-07-30 (×4): qty 1

## 2011-07-30 MED ORDER — TRAMADOL HCL 50 MG PO TABS
50.0000 mg | ORAL_TABLET | Freq: Four times a day (QID) | ORAL | Status: DC | PRN
  Administered 2011-07-30 – 2011-08-02 (×6): 50 mg via ORAL
  Filled 2011-07-30 (×6): qty 1

## 2011-07-30 MED ORDER — POTASSIUM CHLORIDE IN NACL 20-0.9 MEQ/L-% IV SOLN
INTRAVENOUS | Status: DC
  Administered 2011-07-30: 15:00:00 via INTRAVENOUS
  Filled 2011-07-30 (×6): qty 1000

## 2011-07-30 MED ORDER — MIDAZOLAM HCL 5 MG/5ML IJ SOLN
INTRAMUSCULAR | Status: DC | PRN
  Administered 2011-07-30: 1 mg via INTRAVENOUS

## 2011-07-30 MED ORDER — LACTATED RINGERS IV SOLN
INTRAVENOUS | Status: DC | PRN
  Administered 2011-07-30: 07:00:00 via INTRAVENOUS

## 2011-07-30 MED ORDER — BUPIVACAINE-EPINEPHRINE 0.25% -1:200000 IJ SOLN
INTRAMUSCULAR | Status: DC | PRN
  Administered 2011-07-30: 4 mL

## 2011-07-30 MED ORDER — HYDROMORPHONE HCL PF 1 MG/ML IJ SOLN: 0.2500 mg | INTRAMUSCULAR | Status: DC | PRN

## 2011-07-30 MED ORDER — METHOCARBAMOL 500 MG PO TABS
500.0000 mg | ORAL_TABLET | Freq: Four times a day (QID) | ORAL | Status: DC | PRN
  Administered 2011-07-30 – 2011-08-01 (×3): 500 mg via ORAL
  Filled 2011-07-30 (×3): qty 1

## 2011-07-30 MED ORDER — FENTANYL CITRATE 0.05 MG/ML IJ SOLN
INTRAMUSCULAR | Status: DC | PRN
  Administered 2011-07-30: 100 ug via INTRAVENOUS

## 2011-07-30 MED ORDER — OMEGA-3 FATTY ACIDS 1000 MG PO CAPS: 1.0000 g | ORAL_CAPSULE | Freq: Two times a day (BID) | ORAL | Status: DC

## 2011-07-30 MED ORDER — PHENYLEPHRINE HCL 10 MG/ML IJ SOLN
10.0000 mg | INTRAVENOUS | Status: DC | PRN
  Administered 2011-07-30: 10 ug/min via INTRAVENOUS

## 2011-07-30 MED ORDER — METHOCARBAMOL 500 MG PO TABS: 500.0000 mg | ORAL_TABLET | Freq: Three times a day (TID) | ORAL | Status: AC | PRN

## 2011-07-30 MED ORDER — DROPERIDOL 2.5 MG/ML IJ SOLN: 0.6250 mg | INTRAMUSCULAR | Status: DC | PRN

## 2011-07-30 SURGICAL SUPPLY — 77 items
BIT DRILL 170X2.5X (BIT) IMPLANT
BIT DRL 170X2.5X (BIT) ×1
BLADE SAW SAG 73X25 THK (BLADE) ×1
BLADE SAW SGTL 73X25 THK (BLADE) ×1 IMPLANT
BUR SURG 4X8 MED (BURR) IMPLANT
BURR SURG 4X8 MED (BURR)
CLOTH BEACON ORANGE TIMEOUT ST (SAFETY) ×2 IMPLANT
COVER SURGICAL LIGHT HANDLE (MISCELLANEOUS) ×2 IMPLANT
DRAPE INCISE IOBAN 66X45 STRL (DRAPES) ×2 IMPLANT
DRAPE U-SHAPE 47X51 STRL (DRAPES) ×2 IMPLANT
DRAPE X-RAY CASS 24X20 (DRAPES) IMPLANT
DRILL 2.5 (BIT) ×2
DRILL BIT 5/64 (BIT) ×2 IMPLANT
DRSG ADAPTIC 3X8 NADH LF (GAUZE/BANDAGES/DRESSINGS) ×2 IMPLANT
DRSG MEPILEX BORDER 4X8 (GAUZE/BANDAGES/DRESSINGS) ×1 IMPLANT
DRSG PAD ABDOMINAL 8X10 ST (GAUZE/BANDAGES/DRESSINGS) ×4 IMPLANT
DURAPREP 26ML APPLICATOR (WOUND CARE) ×2 IMPLANT
ELECT BLADE 4.0 EZ CLEAN MEGAD (MISCELLANEOUS) ×2
ELECT NDL TIP 2.8 STRL (NEEDLE) ×1 IMPLANT
ELECT NEEDLE TIP 2.8 STRL (NEEDLE) ×2 IMPLANT
ELECT REM PT RETURN 9FT ADLT (ELECTROSURGICAL) ×2
ELECTRODE BLDE 4.0 EZ CLN MEGD (MISCELLANEOUS) ×1 IMPLANT
ELECTRODE REM PT RTRN 9FT ADLT (ELECTROSURGICAL) ×1 IMPLANT
GLOVE BIOGEL PI IND STRL 7.5 (GLOVE) IMPLANT
GLOVE BIOGEL PI INDICATOR 7.5 (GLOVE) ×1
GLOVE BIOGEL PI ORTHO PRO 7.5 (GLOVE)
GLOVE BIOGEL PI ORTHO PRO SZ8 (GLOVE) ×1
GLOVE ORTHO TXT STRL SZ7.5 (GLOVE) ×1 IMPLANT
GLOVE PI ORTHO PRO STRL 7.5 (GLOVE) ×1 IMPLANT
GLOVE PI ORTHO PRO STRL SZ8 (GLOVE) ×1 IMPLANT
GLOVE SS BIOGEL STRL SZ 7 (GLOVE) IMPLANT
GLOVE SUPERSENSE BIOGEL SZ 7 (GLOVE) ×1
GLOVE SURG ORTHO 8.0 STRL STRW (GLOVE) ×2 IMPLANT
GOWN PREVENTION PLUS LG XLONG (DISPOSABLE) ×1 IMPLANT
GOWN STRL NON-REIN LRG LVL3 (GOWN DISPOSABLE) ×2 IMPLANT
GOWN STRL REIN XL XLG (GOWN DISPOSABLE) ×4 IMPLANT
HANDPIECE INTERPULSE COAX TIP (DISPOSABLE)
KIT BASIN OR (CUSTOM PROCEDURE TRAY) ×2 IMPLANT
KIT ROOM TURNOVER OR (KITS) ×2 IMPLANT
MANIFOLD NEPTUNE II (INSTRUMENTS) ×2 IMPLANT
NDL 1/2 CIR MAYO (NEEDLE) ×1 IMPLANT
NDL HYPO 25GX1X1/2 BEV (NEEDLE) ×1 IMPLANT
NDL SUT 6 .5 CRC .975X.05 MAYO (NEEDLE) ×1 IMPLANT
NEEDLE 1/2 CIR MAYO (NEEDLE) ×2 IMPLANT
NEEDLE HYPO 25GX1X1/2 BEV (NEEDLE) ×2 IMPLANT
NEEDLE MAYO TAPER (NEEDLE)
NS IRRIG 1000ML POUR BTL (IV SOLUTION) ×2 IMPLANT
PACK SHOULDER (CUSTOM PROCEDURE TRAY) ×2 IMPLANT
PAD ARMBOARD 7.5X6 YLW CONV (MISCELLANEOUS) ×4 IMPLANT
PIN GUIDE 1.2 (PIN) IMPLANT
PIN GUIDE GLENOPHERE 1.5MX300M (PIN) IMPLANT
PIN METAGLENE 2.5 (PIN) IMPLANT
SET HNDPC FAN SPRY TIP SCT (DISPOSABLE) IMPLANT
SLING ARM IMMOBILIZER LRG (SOFTGOODS) ×2 IMPLANT
SLING ARM IMMOBILIZER MED (SOFTGOODS) IMPLANT
SMARTMIX MINI TOWER (MISCELLANEOUS) ×2
SPONGE GAUZE 4X4 12PLY (GAUZE/BANDAGES/DRESSINGS) ×2 IMPLANT
SPONGE LAP 18X18 X RAY DECT (DISPOSABLE) ×2 IMPLANT
SPONGE LAP 4X18 X RAY DECT (DISPOSABLE) ×2 IMPLANT
SPONGE SURGIFOAM ABS GEL SZ50 (HEMOSTASIS) IMPLANT
STRIP CLOSURE SKIN 1/2X4 (GAUZE/BANDAGES/DRESSINGS) ×2 IMPLANT
SUCTION FRAZIER TIP 10 FR DISP (SUCTIONS) ×2 IMPLANT
SUT FIBERWIRE #2 38 T-5 BLUE (SUTURE) ×4
SUT MNCRL AB 4-0 PS2 18 (SUTURE) ×2 IMPLANT
SUT VIC AB 0 CT1 27 (SUTURE) ×2
SUT VIC AB 0 CT1 27XBRD ANBCTR (SUTURE) ×1 IMPLANT
SUT VIC AB 2-0 CT1 27 (SUTURE) ×2
SUT VIC AB 2-0 CT1 TAPERPNT 27 (SUTURE) ×1 IMPLANT
SUT VICRYL AB 2 0 TIES (SUTURE) ×2 IMPLANT
SUTURE FIBERWR #2 38 T-5 BLUE (SUTURE) ×2 IMPLANT
SYR CONTROL 10ML LL (SYRINGE) ×2 IMPLANT
TOWEL OR 17X24 6PK STRL BLUE (TOWEL DISPOSABLE) ×2 IMPLANT
TOWEL OR 17X26 10 PK STRL BLUE (TOWEL DISPOSABLE) ×2 IMPLANT
TOWER SMARTMIX MINI (MISCELLANEOUS) ×1 IMPLANT
TRAY FOLEY CATH 14FR (SET/KITS/TRAYS/PACK) ×2 IMPLANT
WATER STERILE IRR 1000ML POUR (IV SOLUTION) ×2 IMPLANT
YANKAUER SUCT BULB TIP NO VENT (SUCTIONS) IMPLANT

## 2011-07-30 NOTE — Preoperative (Signed)
Beta Blockers   Reason not to administer Beta Blockers:Not Applicable 

## 2011-07-30 NOTE — Progress Notes (Signed)
UR COMPLETED  

## 2011-07-30 NOTE — Transfer of Care (Signed)
Immediate Anesthesia Transfer of Care Note  Patient: [name]  Procedure(s) Performed: Procedure(s) (LRB): TOTAL SHOULDER ARTHROPLASTY (Right)  Patient Location: PACU  Anesthesia Type: GA combined with regional for post-op pain  Level of Consciousness: awake, oriented and sedated  Airway & Oxygen Therapy: Patient Spontanous Breathing and Patient connected to nasal cannula oxygen  Post-op Assessment: Report given to PACU RN, Post -op Vital signs reviewed and stable, Patient moving all extremities and Patient able to stick tongue midline  Post vital signs: Reviewed and stable  Complications: No apparent anesthesia complications

## 2011-07-30 NOTE — Interval H&P Note (Signed)
History and Physical Interval Note:  07/30/2011 7:26 AM  [name]  has presented today for surgery, with the diagnosis of right shoulder rotator curr arthropathy  The various methods of treatment have been discussed with the patient and family. After consideration of risks, benefits and other options for treatment, the patient has consented to  Procedure(s) (LRB): TOTAL SHOULDER ARTHROPLASTY (Right) as a surgical intervention .  The patients' history has been reviewed, patient examined, no change in status, stable for surgery.  I have reviewed the patients' chart and labs.  Questions were answered to the patient's satisfaction.     [name],STEVEN R

## 2011-07-30 NOTE — Anesthesia Postprocedure Evaluation (Signed)
Anesthesia Post Note  Patient: [name]  Procedure(s) Performed: Procedure(s) (LRB): TOTAL SHOULDER ARTHROPLASTY (Right)  Anesthesia type: general  Patient location: PACU  Post pain: Pain level controlled  Post assessment: Patient's Cardiovascular Status Stable  Last Vitals:  Filed Vitals:   07/30/11 1217  BP:   Pulse: 74  Temp:   Resp: 15    Post vital signs: Reviewed and stable  Level of consciousness: sedated  Complications: No apparent anesthesia complications

## 2011-07-30 NOTE — Discharge Summary (Signed)
Physician Discharge Summary  Patient ID: [name] MRN: [id number] DOB/AGE: May 25, 1927 78 y.o.  Admit date: 07/30/2011 Discharge date: 07/30/2011  Admission Diagnoses:  Right shoulder arthritis, end stage and rotator cuff insufficiency  Discharge Diagnoses:  Same   Surgeries: Procedure(s): TOTAL SHOULDER ARTHROPLASTY on 07/30/2011, reverse   Consultants: OT, D/C planning  Discharged Condition: Stable  Hospital Course: [name] is an 78 y.o. female who was admitted 07/30/2011 with a chief complaint of shoulder pain, and found to have a diagnosis of rotator cuff tear arthropathy.  They were brought to the operating room on 07/30/2011 and underwent the above named procedures.    The patient had an uncomplicated hospital course and was stable for discharge.  Recent vital signs:  Filed Vitals:   07/30/11 0554  BP: 149/93  Pulse: 90  Temp: 98.1 F (36.7 C)  Resp: 18    Recent laboratory studies:  Results for orders placed during the hospital encounter of 07/26/11  BASIC METABOLIC PANEL      Component Value Range   Sodium 138  135 - 145 mEq/L   Potassium 4.1  3.5 - 5.1 mEq/L   Chloride 103  96 - 112 mEq/L   CO2 26  19 - 32 mEq/L   Glucose, Bld 90  70 - 99 mg/dL   BUN 17  6 - 23 mg/dL   Creatinine, Ser 4.09  0.50 - 1.10 mg/dL   Calcium 9.4  8.4 - 81.1 mg/dL   GFR calc non Af Amer 85 (*) >90 mL/min   GFR calc Af Amer >90  >90 mL/min  CBC      Component Value Range   WBC 6.1  4.0 - 10.5 K/uL   RBC 4.97  3.87 - 5.11 MIL/uL   Hemoglobin 13.9  12.0 - 15.0 g/dL   HCT 91.4  78.2 - 95.6 %   MCV 83.7  78.0 - 100.0 fL   MCH 28.0  26.0 - 34.0 pg   MCHC 33.4  30.0 - 36.0 g/dL   RDW 21.3 (*) 08.6 - 57.8 %   Platelets 238  150 - 400 K/uL  TYPE AND SCREEN      Component Value Range   ABO/RH(D) O POS     Antibody Screen NEG     Sample Expiration 08/09/2011    SURGICAL PCR SCREEN      Component Value Range   MRSA, PCR NEGATIVE  NEGATIVE   Staphylococcus  aureus NEGATIVE  NEGATIVE    Discharge Medications:   Medication List  As of 07/30/2011  7:34 AM   ASK your doctor about these medications         aspirin 325 MG tablet   Take 325 mg by mouth daily.      cyclobenzaprine 10 MG tablet   Commonly known as: FLEXERIL   Take 1 tablet (10 mg total) by mouth at bedtime.      docusate sodium 100 MG capsule   Commonly known as: COLACE   Take 100 mg by mouth 2 (two) times daily.      esomeprazole 40 MG capsule   Commonly known as: NEXIUM   Take 1 capsule (40 mg total) by mouth daily before breakfast.      fish oil-omega-3 fatty acids 1000 MG capsule   Take 1 g by mouth 2 (two) times daily.      gabapentin 300 MG capsule   Commonly known as: NEURONTIN   Take 1 capsule (300 mg total) by mouth at  bedtime.      hydrochlorothiazide 25 MG tablet   Commonly known as: HYDRODIURIL   Take 1 tablet (25 mg total) by mouth daily.      multivitamin with minerals Tabs   Take 1 tablet by mouth daily.      traMADol 50 MG tablet   Commonly known as: ULTRAM   Take 50 mg by mouth every 6 (six) hours as needed. For pain      Trospium Chloride 60 MG Cp24   Take 1 capsule (60 mg total) by mouth daily.            Diagnostic Studies: Dg Chest 2 View  07/26/2011  *RADIOLOGY REPORT*  Clinical Data: Preoperative assessment for right shoulder replacement  CHEST - 2 VIEW  Comparison: Jun 24, 2010  Findings: Borderline enlargement of cardiac silhouette. Atherosclerotic calcification aorta. Pulmonary vascularity normal. Large hiatal hernia with prominent air fluid level. Low lung volumes with bibasilar atelectasis. Upper lungs clear. No pleural effusion or pneumothorax. Interval left shoulder joint replacement. Advanced degenerative changes right glenohumeral joint. Bones appear demineralized.  IMPRESSION: Large hiatal hernia. Bibasilar atelectasis.  Original Report Authenticated By: Lollie Marrow, M.D.    Disposition: 03-Skilled Nursing  Facility    Follow-up Information    Follow up with Paulett Kaufhold,STEVEN R, MD. Call in 2 weeks. [phone])    Contact information:   [facility] [address], [address] [place] [address] [phone]           Signed: Verlee Rossetti 07/30/2011, 7:34 AM

## 2011-07-30 NOTE — Brief Op Note (Signed)
07/30/2011  10:10 AM  PATIENT:  [name]  78 y.o. female  PRE-OPERATIVE DIAGNOSIS:  right shoulder rotator cuff arthropathy  POST-OPERATIVE DIAGNOSIS:  right shoulder rotator cuff arthropathy  PROCEDURE:  Procedure(s) (LRB): TOTAL SHOULDER ARTHROPLASTY (Right)  SURGEON:  Surgeon(s) and Role:    * Verlee Rossetti, MD - Primary  PHYSICIAN ASSISTANT:   ASSISTANTS: Patrick Jupiter RNFA   ANESTHESIA:   regional and general  EBL:  Total I/O In: -  Out: 225 [Blood:225]  BLOOD ADMINISTERED:none  DRAINS: none   LOCAL MEDICATIONS USED:  MARCAINE     SPECIMEN:  No Specimen  DISPOSITION OF SPECIMEN:  N/A  COUNTS:  YES  TOURNIQUET:  * No tourniquets in log *  DICTATION: .Other Dictation: Dictation Number [id number]  PLAN OF CARE: Admit to inpatient   PATIENT DISPOSITION:  PACU - hemodynamically stable.   Delay start of Pharmacological VTE agent (>24hrs) due to surgical blood loss or risk of bleeding: not applicable

## 2011-07-30 NOTE — Anesthesia Preprocedure Evaluation (Addendum)
Anesthesia Evaluation  Patient identified by MRN, date of birth, ID band Patient awake    Reviewed: Allergy & Precautions, H&P , NPO status , Patient's Chart, lab work & pertinent test results  History of Anesthesia Complications Negative for: history of anesthetic complications  Airway Mallampati: II TM Distance: >3 FB Neck ROM: Full    Dental  (+) Missing, Poor Dentition and Dental Advisory Given   Pulmonary neg pulmonary ROS,  breath sounds clear to auscultation  Pulmonary exam normal       Cardiovascular negative cardio ROS  Rhythm:Regular Rate:Normal  Cardiomegaly by history   Neuro/Psych negative psych ROS   GI/Hepatic Neg liver ROS, hiatal hernia, GERD-  Medicated and Controlled,  Endo/Other  negative endocrine ROSGoiter  Renal/GU negative Renal ROS     Musculoskeletal  (+) Arthritis -, Osteoarthritis,    Abdominal   Peds  Hematology negative hematology ROS (+)   Anesthesia Other Findings   Reproductive/Obstetrics                       Anesthesia Physical Anesthesia Plan  ASA: III  Anesthesia Plan: General   Post-op Pain Management:    Induction: Intravenous  Airway Management Planned: Oral ETT  Additional Equipment:   Intra-op Plan:   Post-operative Plan: Extubation in OR  Informed Consent: I have reviewed the patients History and Physical, chart, labs and discussed the procedure including the risks, benefits and alternatives for the proposed anesthesia with the patient or authorized representative who has indicated his/her understanding and acceptance.   Dental advisory given  Plan Discussed with: Anesthesiologist, Surgeon and CRNA  Anesthesia Plan Comments:        Anesthesia Quick Evaluation

## 2011-07-30 NOTE — Anesthesia Procedure Notes (Addendum)
Anesthesia Regional Block:  Interscalene brachial plexus block  Pre-Anesthetic Checklist: ,, timeout performed, Correct Patient, Correct Site, Correct Laterality, Correct Procedure,, site marked, risks and benefits discussed, Surgical consent,  Pre-op evaluation,  At surgeon's request and post-op pain management  Laterality: Right  Prep: chloraprep       Needles:  Injection technique: Single-shot  Needle Type: Echogenic Stimulator Needle     Needle Length: 5cm 5 cm Needle Gauge: 22 and 22 G    Additional Needles:  Procedures: ultrasound guided and nerve stimulator Interscalene brachial plexus block  Nerve Stimulator or Paresthesia:  Response: bicep contraction, 0.45 mA,   Additional Responses:   Narrative:  Start time: 07/30/2011 6:57 AM End time: 07/30/2011 7:09 AM Injection made incrementally with aspirations every 5 mL.  Performed by: Personally  Anesthesiologist: J. Adonis Huguenin, MD  Additional Notes: Functioning IV was confirmed and monitors applied.  A 50mm 22ga echogenic arrow stimulator was used. Sterile prep and drape,hand hygiene and sterile gloves were used.Ultrasound guidance: relevant anatomy identified, needle position confirmed, local anesthetic spread visualized around nerve(s)., vascular puncture avoided.  Image printed for medical record.  Negative aspiration and negative test dose prior to incremental administration of local anesthetic. The patient tolerated the procedure well.  Interscalene brachial plexus block Procedure Name: Intubation Date/Time: 07/30/2011 7:38 AM Performed by: Neomia Dear, Puja Caffey K Pre-anesthesia Checklist: Emergency Drugs available, Patient identified, Timeout performed, Suction available and Patient being monitored Patient Re-evaluated:Patient Re-evaluated prior to inductionOxygen Delivery Method: Circle system utilized Preoxygenation: Pre-oxygenation with 100% oxygen Intubation Type: IV induction Ventilation: Mask ventilation without  difficulty Laryngoscope Size: Miller and 2 Grade View: Grade II Tube type: Oral Tube size: 7.5 mm Number of attempts: 1 Airway Equipment and Method: Stylet Placement Confirmation: ETT inserted through vocal cords under direct vision,  positive ETCO2 and breath sounds checked- equal and bilateral Secured at: 22 cm Tube secured with: Tape (Dr. Krista Blue) Dental Injury: Teeth and Oropharynx as per pre-operative assessment

## 2011-07-30 NOTE — Discharge Instructions (Signed)
May use the right arm for daily activities including use of the walker.  Keep incision clean and dry for one week, then shower.  No lifting under the arm by attendants.

## 2011-07-30 NOTE — Op Note (Signed)
NAMENIMCO, BIVENS NO.:  [id number]  MEDICAL RECORD NO.:  [id number]  LOCATION:  [facility]                         FACILITY:  [facility]  PHYSICIAN:  Almedia Balls. Ranell Patrick, M.D. DATE OF BIRTH:  1927-05-29  DATE OF PROCEDURE:  07/30/2011 DATE OF DISCHARGE:                              OPERATIVE REPORT   PREOPERATIVE DIAGNOSIS:  Right shoulder end-stage arthritis.  POSTOPERATIVE DIAGNOSIS:  Right shoulder end-stage arthritis.  PROCEDURE PERFORMED:  Right shoulder reverse total shoulder arthroplasty using DePuy prosthesis.  ATTENDING SURGEON:  Almedia Balls. Ranell Patrick, M.D.  ASSISTANT:  Patrick Jupiter, RNFA.  ANESTHESIA:  General anesthesia plus interscalene block anesthesia was used.  ESTIMATED BLOOD LOSS:  150 mL.  FLUID REPLACEMENT:  1000 mL of crystalloid.  COUNTS:  Correct.  COMPLICATIONS:  No complications.  Perioperative antibiotics were given.  INDICATIONS:  The patient is an 78 year old female with worsening right shoulder pain secondary to rotator cuff tear arthropathy.  Patient's left shoulder has previously been replaced and she has done well with that.  She presents with disabling right shoulder pain desiring shoulder surgery. An informed consent obtained.  DESCRIPTION OF PROCEDURE:  After adequate level of anesthesia was achieved, the patient was positioned in the modified beach-chair position.  The right shoulder correctly identified.  Time-out called. Sterile prep and drape performed of the right shoulder and arm.  We entered the shoulder by using a standard deltopectoral approach, dissection down through subcutaneous tissues.  Deltopectoral interval was identified.  Cephalic vein was identified and taken laterally with the deltoid.  Pectoralis was taken medially.  Upper centimeter of pectoralis was released off the humerus.  Conjoined tendon was identified and retracted medially.  Deep retractors were placed, we then removed the subscapularis off the  lesser tuberosity.  Placing #2 FiberWire suture in a modified Mason-Allen suture technique in the tendon for repair, we then progressively released the inferior capsule off the humeral neck.  The front portion of the rotator cuff which was diminutive and thin, we released that and then went ahead and externally rotated the humerus so we could deliver the humeral head out of the wound.  We then went ahead and sequentially reamed up to a size 10 reamer and then performed a metaphyseal preparation for a size 10 stem with an epi 1 right metaphyseal modular component and we impacted the trial prosthesis in place.  Once we were sure that fit properly, then we went ahead and impacted the real prosthesis, this was a hydroxyapatite coated 10 stem epi 1 right set on 0 degrees.  Once that was done and secured, we went ahead and retracted the humerus posteriorly.  We did a 360-degree capsular release and capsulectomy, followed by glenoid labral removal.  The cartilage was completely worn off from the shoulder, it was bone-on-bone.  We protected the axillary nerve, then placed our central guide pin, reamed for the Metaglene and drilled our central PEG hole.  We impacted the Metaglene in position and placed 2 lock screws, 1 inferiorly at 40 mm of length and the superior screw was 30 and then the anterior-posterior screws were just 18 nonlocked screws.  With good purchase and good secure fixation of the Metaglene, we then placed a  38 standard glenosphere in place making sure that the nerve was not engaged, 38+ 6 poly fit perfectly.  We then placed the real 38+ 6 poly by thoroughly irrigating the wound and then repairing the rotator cuff and subscapularis to bone anatomically including rotator interval suturing.  This would not significantly limit the range of motion of the shoulder and we felt that would provide additional stability.  We then repaired deltopectoral interval with 0 Vicryl suture, followed  by 2-0 Vicryl for subcutaneous closure, and 4-0 Monocryl for skin.  Steri- Strips were applied, followed by a sterile bandage.  The patient tolerated the surgery well.     Almedia Balls. Ranell Patrick, M.D.     SRN/MEDQ  D:  07/30/2011  T:  07/30/2011  Job:  [id number]

## 2011-07-31 NOTE — Evaluation (Signed)
Physical Therapy Evaluation Patient Details Name: [name] MRN: [id number] DOB: 21-Sep-1927 Today's Date: 07/31/2011 Time: 8295-6213 PT Time Calculation (min): 27 min  PT Assessment / Plan / Recommendation Clinical Impression  Patient is an 78 yo female s/p reverse TSA on right.Patient required assist with mobility pta, using RW for gait.  Agree with need for ST-SNF for continued therapy at discharge.  Will follow for mobility, gait, and strengthening.    PT Assessment  Patient needs continued PT services    Follow Up Recommendations  Skilled nursing facility    Barriers to Discharge Decreased caregiver support      lEquipment Recommendations  Defer to next venue    Recommendations for Other Services     Frequency Min 3X/week    Precautions / Restrictions Precautions Precautions: Shoulder Type of Shoulder Precautions: No PROM. A/AAROM OK. May use for RW. Limit weight bearing through RUE. Precaution Booklet Issued: Yes (comment) Required Braces or Orthoses: Other Brace/Splint Other Brace/Splint: sling Restrictions Weight Bearing Restrictions: Yes RUE Weight Bearing: Partial weight bearing RUE Partial Weight Bearing Percentage or Pounds: for RW. limit wt bearing       Mobility  Transfers Transfers: Sit to Stand;Stand to Sit Sit to Stand: 1: +1 Total assist;With upper extremity assist;With armrests;From chair/3-in-1 (left UE assist) Stand to Sit: 1: +1 Total assist;With upper extremity assist;With armrests;To chair/3-in-1 (left UE assist) Details for Transfer Assistance: Patient required max assist to scoot to edge of chair.  Max assist to stand - difficulty reaching full upright position. Ambulation/Gait Ambulation/Gait Assistance: Not tested (comment)    Exercises General Exercises - Lower Extremity Ankle Circles/Pumps: AROM;Both;10 reps;Seated Long Arc Quad: AROM;Both;10 reps;Seated Hip Flexion/Marching: AROM;Both;10 reps;Seated   PT Diagnosis: Difficulty  walking;Generalized weakness;Acute pain  PT Problem List: Decreased strength;Decreased range of motion;Decreased activity tolerance;Decreased balance;Decreased mobility;Decreased cognition;Decreased knowledge of use of DME;Impaired sensation;Pain PT Treatment Interventions: DME instruction;Gait training;Functional mobility training;Therapeutic exercise;Cognitive remediation;Patient/family education   PT Goals Acute Rehab PT Goals PT Goal Formulation: With patient/family Time For Goal Achievement: 08/14/11 Potential to Achieve Goals: Good Pt will go Supine/Side to Sit: with min assist;with HOB 0 degrees PT Goal: Supine/Side to Sit - Progress: Goal set today Pt will go Sit to Supine/Side: with min assist;with HOB 0 degrees PT Goal: Sit to Supine/Side - Progress: Goal set today Pt will go Sit to Stand: with min assist;with upper extremity assist PT Goal: Sit to Stand - Progress: Goal set today Pt will go Stand to Sit: with min assist;with upper extremity assist PT Goal: Stand to Sit - Progress: Goal set today Pt will Transfer Bed to Chair/Chair to Bed: with min assist PT Transfer Goal: Bed to Chair/Chair to Bed - Progress: Goal set today Pt will Ambulate: 16 - 50 feet;with min assist;with rolling walker PT Goal: Ambulate - Progress: Goal set today  Visit Information  Last PT Received On: 07/31/11 Assistance Needed: +2    Subjective Data  Subjective: "This is my second shoulder replacement."  "I'm sleepy (pain meds)" Patient Stated Goal: To get stronger.   Prior Functioning  Home Living Lives With: Spouse Available Help at Discharge: Skilled Nursing Facility Type of Home: House Home Access: Ramped entrance Home Layout: Multi-level;Able to live on main level with bedroom/bathroom Home Adaptive Equipment: Walker - rolling Prior Function Level of Independence: Needs assistance Needs Assistance: Bathing;Dressing;Meal Prep;Light Housekeeping;Transfers Bath: Minimal Dressing:  Moderate Meal Prep: Maximal Light Housekeeping: Maximal Transfer Assistance: assist with bed mobility and sit to stand. Able to Take Stairs?: No Driving:  Yes Communication Communication: No difficulties Dominant Hand: Right    Cognition  Overall Cognitive Status: Impaired Area of Impairment: Safety/judgement;Problem solving Arousal/Alertness: Lethargic (? from pain meds) Orientation Level: Disoriented to;Time Behavior During Session: Lethargic Safety/Judgement: Decreased awareness of safety precautions Cognition - Other Comments: delayed problem solving/processing. Appears to have STM deficits.    Extremity/Trunk Assessment Right Lower Extremity Assessment RLE ROM/Strength/Tone: WFL for tasks assessed Left Lower Extremity Assessment LLE ROM/Strength/Tone: Deficits LLE ROM/Strength/Tone Deficits: General weakness. Dorsiflexion 2/5.  Otherwise 3/5. LLE Sensation: Deficits LLE Sensation Deficits: Decreased to light touch.    Balance Balance Balance Assessed:  (MAx A for balance during functional task. )  End of Session PT - End of Session Equipment Utilized During Treatment: Gait belt (sling RUE) Activity Tolerance: Patient limited by fatigue Patient left: in chair;with call bell/phone within reach;with family/visitor present Nurse Communication: Mobility status   Vena Austria 07/31/2011, 11:56 AM Durenda Hurt. Renaldo Fiddler, [facility] Acute Rehab Services Pager [phone]

## 2011-07-31 NOTE — Progress Notes (Signed)
Occupational Therapy Treatment Patient Details Name: [name] MRN: [id number] DOB: 1927-11-12 Today's Date: 07/31/2011 Time: 6962-9528 OT Time Calculation (min): 20 min  OT Assessment / Plan / Recommendation Comments on Treatment Session Pt unable to perform pendulums at this time. Will attempt again later if able wheh husband is available to begin family education.    Follow Up Recommendations  Skilled nursing facility    Barriers to Discharge  Decreased caregiver support    Equipment Recommendations  Defer to next venue    Recommendations for Other Services PT consult;Other (comment)  Frequency Min 2X/week   Plan Discharge plan remains appropriate    Precautions / Restrictions Precautions Precautions: Shoulder Type of Shoulder Precautions: No PROM. A/AAROM OK. May use for RW. Limit weight bearing through RUE. Precaution Booklet Issued: Yes (comment) Required Braces or Orthoses: Other Brace/Splint Other Brace/Splint: sling Restrictions Weight Bearing Restrictions: Yes RUE Weight Bearing: Partial weight bearing RUE Partial Weight Bearing Percentage or Pounds: for RW. limit wt bearing   Pertinent Vitals/Pain Pt reports pain is improving. Repositioned. Ice applied.       OT Diagnosis: Generalized weakness;Acute pain  OT Problem List: Decreased strength;Decreased range of motion;Decreased activity tolerance;Impaired balance (sitting and/or standing);Decreased cognition;Decreased coordination;Decreased safety awareness;Decreased knowledge of use of DME or AE;Decreased knowledge of precautions;Impaired UE functional use;Pain;Increased edema OT Treatment Interventions: Self-care/ADL training;Therapeutic exercise;Energy conservation;DME and/or AE instruction;Therapeutic activities;Patient/family education   OT Goals Acute Rehab OT Goals OT Goal Formulation: With patient Time For Goal Achievement: 08/07/11 Potential to Achieve Goals: Good ADL Goals Pt Will Perform  Grooming: with min assist;Sitting, chair;Supported;with cueing (comment type and amount) ADL Goal: Grooming - Progress: Progressing toward goals Pt Will Perform Upper Body Bathing: with min assist;Sitting, chair;Supported;with cueing (comment type and amount) ADL Goal: Upper Body Bathing - Progress: Progressing toward goals Pt Will Perform Upper Body Dressing: with mod assist;Sitting, chair;Supported;with cueing (comment type and amount) ADL Goal: Upper Body Dressing - Progress: Progressing toward goals Pt Will Transfer to Toilet: with mod assist;Stand pivot transfer;with DME;3-in-1;Ambulation;Maintaining weight bearing status;with cueing (comment type and amount) ADL Goal: Toilet Transfer - Progress: Progressing toward goals Pt Will Perform Toileting - Hygiene: with min assist;Standing at 3-in-1/toilet;with cueing (comment type and amount) ADL Goal: Toileting - Hygiene - Progress: Progressing toward goals Arm Goals Pt Will Perform AROM: Right upper extremity;with minimal assist;1 set;10 reps Arm Goal: AROM - Progress: Progressing toward goal Additional Arm Goal #1: PT will complete modified pendulums RUE with RUE in safest position with min A. Arm Goal: Additional Goal #1 - Progress: Progressing toward goals  Visit Information  Last OT Received On: 07/31/11    Subjective Data  Subjective: I can use my arm for my walker      Cognition  Overall Cognitive Status: No family/caregiver present to determine baseline cognitive functioning Arousal/Alertness: Awake/alert Orientation Level: Appears intact for tasks assessed Behavior During Session: [facility] for tasks performed Cognition - Other Comments: delayed problem solving/processing. Appears to have STM deficits.    Mobility Bed Mobility Bed Mobility: Supine to Sit;Sitting - Scoot to Edge of Bed Supine to Sit: 1: +1 Total assist;With rails;HOB elevated Sitting - Scoot to Edge of Bed: 1: +1 Total assist;With rail Details for Bed Mobility  Assistance: pt @ 10% Transfers Transfers: Sit to Stand;Stand to Sit Sit to Stand: 1: +1 Total assist;From bed;From chair/3-in-1;With upper extremity assist;From elevated surface Stand to Sit: 1: +1 Total assist;With upper extremity assist;To chair/3-in-1 Details for Transfer Assistance: rec for nursing to use +2 total A.  No lifting under RUE. Transfer toward left strong side and use RW. max vc for sequence. Max vc to let go of rail.   Exercises General Exercises - Upper Extremity Shoulder Flexion: AAROM;Right;10 reps;Supine;Other (comment) (within pain tolerance. @ 20 degrees) Elbow Flexion: AROM;AAROM;Right;10 reps;Seated Elbow Extension: AROM;AAROM;Right;10 reps;Supine Wrist Flexion: AROM;Right;10 reps;Seated Wrist Extension: AROM;Right;10 reps;Seated Digit Composite Flexion: AROM;Right;10 reps Composite Extension: AROM;Right;10 reps  Balance Balance Balance Assessed:  (MAx A for balance during functional task. )  End of Session OT - End of Session Equipment Utilized During Treatment: Gait belt;Other (comment) Activity Tolerance: Patient limited by fatigue;Other (comment) Patient left: in chair;with call bell/phone within reach Nurse Communication: Mobility status;Other (comment) (discussed with CNA)   Keshawn Fiorito,HILLARY 07/31/2011, 10:11 AM Luisa Dago, OTR/L  [phone] 07/31/2011

## 2011-07-31 NOTE — Progress Notes (Signed)
Patient ID: [name], female   DOB: 02-Jan-1928, 78 y.o.   MRN: [id number] Subjective: 1 Day Post-Op Procedure(s) (LRB): TOTAL SHOULDER ARTHROPLASTY (Right)    Patient reports pain as moderate.  Doing fine arm in sling  Objective:   VITALS:   Filed Vitals:   07/30/11 2149  BP: 149/83  Pulse: 108  Temp: 98.9 F (37.2 C)  Resp: 18    Incision: dressing C/D/I, NVI RUE  LABS No results found for this basename: HGB:3,HCT:3,WBC:3,PLT:3 in the last 72 hours  No results found for this basename: NA:3,K:3,BUN:3,CREATININE:3,GLUCOSE:3 in the last 72 hours  No results found for this basename: LABPT:2,INR:2 in the last 72 hours   Assessment/Plan: 1 Day Post-Op Procedure(s) (LRB): TOTAL SHOULDER ARTHROPLASTY (Right)   Discharge to SNF Monday Up with therapy, weak left lower extremity

## 2011-07-31 NOTE — Progress Notes (Signed)
Occupational Therapy Evaluation Patient Details Name: [name] MRN: [id number] DOB: 1927-03-18 Today's Date: 07/31/2011 Time: 7846-9629 OT Time Calculation (min): 40 min  OT Assessment / Plan / Recommendation Clinical Impression  78 yo s/p R reverse TSA. PTA, pt mod I with mobility but required A for ADL secondary to UE limited ROM. Pt will benefit from skilled OT to max indep with ADL and functional mobility for ADL, in addition to increasing funcitonal use RUE, to facilitate D/C to next venue of care. Pt will need SNF for rehab prior to returning home with husband. Pt wants to go to [facility].    OT Assessment  Patient needs continued OT Services    Follow Up Recommendations  Skilled nursing facility    Barriers to Discharge Decreased caregiver support    Equipment Recommendations  Defer to next venue    Recommendations for Other Services PT consult;Other (comment) (SW for SNF)  Frequency  Min 2X/week    Precautions / Restrictions Precautions Precautions: Shoulder Type of Shoulder Precautions: No PROM. A/AAROM OK. May use for RW. Limit weight bearing through RUE. Precaution Booklet Issued: Yes (comment) Required Braces or Orthoses: Other Brace/Splint (sling) Restrictions Weight Bearing Restrictions: Yes RUE Weight Bearing: Partial weight bearing RUE Partial Weight Bearing Percentage or Pounds: for RW. limit wt bearing   Pertinent Vitals/Pain 6. nsg gave pain meds    ADL  Eating/Feeding: Performed;Set up Where Assessed - Eating/Feeding: Bed level Grooming: Simulated;Moderate assistance Where Assessed - Grooming: Supine, head of bed up Upper Body Bathing: Simulated;Maximal assistance Where Assessed - Upper Body Bathing: Supported sitting Lower Body Bathing: Simulated;Maximal assistance Where Assessed - Lower Body Bathing: Supported sit to stand Upper Body Dressing: Simulated;+1 Total assistance Where Assessed - Upper Body Dressing: Supported sitting Lower  Body Dressing: Simulated;+1 Total assistance Where Assessed - Lower Body Dressing: Sopported sit to stand Toilet Transfer: Performed;+1 Total assistance Toilet Transfer Method: Surveyor, minerals: Materials engineer and Hygiene: Performed;Maximal assistance Where Assessed - Engineer, mining and Hygiene: Standing;Sit to stand from 3-in-1 or toilet Equipment Used: Gait belt Transfers/Ambulation Related to ADLs: Scoliotic - unable to sit unsupported for long periods. 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  Assisted less than 25% with transfers. Max vc to let go of bed rail with L hand during BSC transfer. ADL Comments: Total to max A with ADL. Able to use RUE to assist with ADL per Dr. Ranell Patrick.    OT Diagnosis: Generalized weakness;Acute pain  OT Problem List: Decreased strength;Decreased range of motion;Decreased activity tolerance;Impaired balance (sitting and/or standing);Decreased cognition;Decreased coordination;Decreased safety awareness;Decreased knowledge of use of DME or AE;Decreased knowledge of precautions;Impaired UE functional use;Pain;Increased edema OT Treatment Interventions: Self-care/ADL training;Therapeutic exercise;Energy conservation;DME and/or AE instruction;Therapeutic activities;Patient/family education   OT Goals Acute Rehab OT Goals OT Goal Formulation: With patient Time For Goal Achievement: 08/07/11 Potential to Achieve Goals: Good ADL Goals Pt Will Perform Grooming: with min assist;Sitting, chair;Supported;with cueing (comment type and amount) ADL Goal: Grooming - Progress: Goal set today Pt Will Perform Upper Body Bathing: with min assist;Sitting, chair;Supported;with cueing (comment type and amount) ADL Goal: Upper Body Bathing - Progress: Goal set today Pt Will Perform Upper Body Dressing: with mod assist;Sitting, chair;Supported;with cueing (comment type and amount) ADL Goal: Upper Body  Dressing - Progress: Goal set today Pt Will Transfer to Toilet: with mod assist;Stand pivot transfer;with DME;3-in-1;Ambulation;Maintaining weight bearing status;with cueing (comment type and amount) ADL Goal: Toilet Transfer - Progress: Goal set today Pt  Will Perform Toileting - Hygiene: with min assist;Standing at 3-in-1/toilet;with cueing (comment type and amount) ADL Goal: Toileting - Hygiene - Progress: Goal set today Arm Goals Pt Will Perform AROM: Right upper extremity;with minimal assist;1 set;10 reps Arm Goal: AROM - Progress: Goal set today Additional Arm Goal #1: PT will complete modified pendulums RUE with RUE in safest position with min A. Arm Goal: Additional Goal #1 - Progress: Goal set today  Visit Information  Last OT Received On: 07/31/11    Subjective Data  Subjective: I'm going to rehab   Prior Functioning  Home Living Lives With: Spouse Available Help at Discharge: Skilled Nursing Facility Home Access: Ramped entrance Prior Function Level of Independence: Needs assistance;Independent with assistive device(s) Needs Assistance: Bathing;Dressing;Meal Prep;Light Housekeeping;Transfers Bath: Minimal Dressing: Moderate Meal Prep: Maximal Light Housekeeping: Maximal Transfer Assistance: assist with bed mobility.Pt states she was mod I with transfers @ RW level. Able to Take Stairs?: No Driving: Yes Communication Communication: No difficulties Dominant Hand: Right    Cognition  Overall Cognitive Status: No family/caregiver present to determine baseline cognitive functioning Arousal/Alertness: Awake/alert Orientation Level: Appears intact for tasks assessed Behavior During Session: [facility] for tasks performed Cognition - Other Comments: delayed problem solving/processing. Appears to have STM deficits.    Extremity/Trunk Assessment Right Upper Extremity Assessment RUE ROM/Strength/Tone: Deficits;Due to pain;Due to precautions RUE ROM/Strength/Tone Deficits: No  PROM. Marland Kitchen A/AAROM within pain tolerance RUE Sensation: WFL - Light Touch;WFL - Proprioception RUE Coordination: Deficits;WFL - fine motor Left Upper Extremity Assessment LUE ROM/Strength/Tone: Deficits LUE ROM/Strength/Tone Deficits: limited shoulder flexion and ER from previous surgery. Not WFL. LUE Sensation: WFL - Light Touch;WFL - Proprioception LUE Coordination: WFL - fine motor;Deficits Right Lower Extremity Assessment RLE ROM/Strength/Tone: Deficits RLE ROM/Strength/Tone Deficits: "paralysis" from previous back surgeries per pt. Unable to advance RLE during transfer to [facility], requiring manual assist. Limited use of RLE during bed mobility Left Lower Extremity Assessment LLE ROM/Strength/Tone: [facility] for tasks assessed Trunk Assessment Trunk Assessment: Other exceptions (scoliotic) Trunk Exceptions: unable to sit unsupported for extended time periods   Mobility Bed Mobility Bed Mobility: Supine to Sit;Sitting - Scoot to Edge of Bed Supine to Sit: 1: +1 Total assist;With rails;HOB elevated Sitting - Scoot to Edge of Bed: 1: +1 Total assist;With rail Details for Bed Mobility Assistance: pt @ 10% Transfers Transfers: Sit to Stand;Stand to Sit Sit to Stand: 1: +1 Total assist;From bed;From chair/3-in-1;With upper extremity assist;From elevated surface Stand to Sit: 1: +1 Total assist;With upper extremity assist;To chair/3-in-1 Details for Transfer Assistance: rec for nursing to use +2 total A. No lifting under RUE. Transfer toward left strong side and use RW. max vc for sequence. Max vc to let go of rail.      Balance  Max A static standing  End of Session OT - End of Session Equipment Utilized During Treatment: Gait belt;Other (comment) (sling) Activity Tolerance: Patient limited by fatigue;Other (comment) (prior functional status) Patient left: in chair;with call bell/phone within reach Nurse Communication: Patient requests pain meds   Jezreel Sisk,HILLARY 07/31/2011, 10:04 AM Luisa Dago,  OTR/L  [phone] 07/31/2011

## 2011-08-01 MED ORDER — WHITE PETROLATUM GEL
Status: AC
  Filled 2011-08-01: qty 5

## 2011-08-01 NOTE — Progress Notes (Signed)
Orthopedics Progress Note  Subjective: I slept all night last night.  The shoulder hurts.  Objective:  Filed Vitals:   08/01/11 0644  BP: 137/82  Pulse: 110  Temp: 97.6 F (36.4 C)  Resp: 18    General: Awake and alert  Musculoskeletal: Right shoulder incision CDI. Mepilex in place.  NVI Neurovascularly intact  Lab Results  Component Value Date   WBC 6.1 07/26/2011   HGB 13.9 07/26/2011   HCT 41.6 07/26/2011   MCV 83.7 07/26/2011   PLT 238 07/26/2011       Component Value Date/Time   NA 138 07/26/2011 1104   K 4.1 07/26/2011 1104   CL 103 07/26/2011 1104   CO2 26 07/26/2011 1104   GLUCOSE 90 07/26/2011 1104   BUN 17 07/26/2011 1104   CREATININE 0.57 07/26/2011 1104   CALCIUM 9.4 07/26/2011 1104   GFRNONAA 85* 07/26/2011 1104   GFRAA >90 07/26/2011 1104    Lab Results  Component Value Date   INR 3.9 11/30/2010   INR 2.7 11/16/2010   INR 1.3 11/13/2010    Assessment/Plan: POD #2 s/p Procedure(s): TOTAL SHOULDER ARTHROPLASTY Stable orthopedically.  Continue DVT prophylaxis. OOB to chair q shift. PT/OT. D/C planning : [facility] 6/16.  Almedia Balls. Ranell Patrick, MD 08/01/2011 7:26 AM

## 2011-08-01 NOTE — Progress Notes (Signed)
Physical Therapy Treatment Patient Details Name: [name] MRN: [id number] DOB: 04/28/1927 Today's Date: 08/01/2011 Time: 4098-1191 PT Time Calculation (min): 30 min  PT Assessment / Plan / Recommendation Comments on Treatment Session  Patient having great difficulty with standing/transfers without use of RUE.  Recommended to RN to use lift equipment to move patient to/from chair.  Agree with SNF for continued therapy.    Follow Up Recommendations  Skilled nursing facility    Barriers to Discharge        Equipment Recommendations  Defer to next venue    Recommendations for Other Services    Frequency Min 3X/week   Plan Discharge plan remains appropriate;Frequency remains appropriate    Precautions / Restrictions Precautions Precautions: Shoulder Type of Shoulder Precautions: No PROM. A/AAROM OK. May use for RW. Limit weight bearing through RUE. Required Braces or Orthoses: Other Brace/Splint Other Brace/Splint: sling Restrictions Weight Bearing Restrictions: Yes RUE Weight Bearing: Partial weight bearing RUE Partial Weight Bearing Percentage or Pounds: for RW. limit wt bearing       Mobility  Bed Mobility Bed Mobility: Supine to Sit;Sitting - Scoot to Edge of Bed Supine to Sit: 1: +2 Total assist;HOB flat Supine to Sit: Patient Percentage: 30% Sitting - Scoot to Edge of Bed: 1: +1 Total assist Details for Bed Mobility Assistance: pt @ 10% Transfers Transfers: Sit to Stand;Stand to Sit;Stand Pivot Transfers Sit to Stand: 1: +2 Total assist;With upper extremity assist;From bed Sit to Stand: Patient Percentage: 40% Stand to Sit: 1: +2 Total assist;With upper extremity assist;To bed;To chair/3-in-1 Stand to Sit: Patient Percentage: 40% Stand Pivot Transfers: 1: +2 Total assist Stand Pivot Transfers: Patient Percentage: 40% Details for Transfer Assistance: Patient unable to reach fully upright position.  Required +2 assist to remain upright.  Able to move each  foot one step to try to pivot to chair.  Moved chair behind patient to sit.  Required +2 assist to slowly lower to chair. Ambulation/Gait Ambulation/Gait Assistance: Not tested (comment)    Exercises General Exercises - Lower Extremity Ankle Circles/Pumps: AROM;Both;10 reps;Seated Long Arc Quad: AROM;Both;10 reps;Seated Hip Flexion/Marching: AROM;Both;10 reps;Seated     PT Goals Acute Rehab PT Goals PT Goal: Supine/Side to Sit - Progress: Progressing toward goal PT Goal: Sit to Stand - Progress: Progressing toward goal PT Goal: Stand to Sit - Progress: Progressing toward goal PT Transfer Goal: Bed to Chair/Chair to Bed - Progress: Progressing toward goal  Visit Information  Last PT Received On: 08/01/11 Assistance Needed: +2 PT/OT Co-Evaluation/Treatment: Yes    Subjective Data  Subjective: "I can't hold the walker with my (right) hand"   Cognition  Overall Cognitive Status: Impaired Area of Impairment: Safety/judgement;Problem solving Arousal/Alertness: Awake/alert Orientation Level: Appears intact for tasks assessed Behavior During Session: [facility] for tasks performed Safety/Judgement: Decreased awareness of safety precautions Cognition - Other Comments: delayed problem solving/processing. Appears to have STM deficits.    Balance  Balance Balance Assessed: Yes Static Sitting Balance Static Sitting - Balance Support: Left upper extremity supported Static Sitting - Level of Assistance: 4: Min assist Static Sitting - Comment/# of Minutes: Able to maintain upright sitting balance without UE support for 30 seconds.  End of Session PT - End of Session Equipment Utilized During Treatment: Gait belt Activity Tolerance: Patient limited by fatigue Patient left: in chair;with call bell/phone within reach;with family/visitor present Nurse Communication: Mobility status;Need for lift equipment    Vena Austria 08/01/2011, 9:45 AM Durenda Hurt. Earlene Plater, PT, MBA Acute Rehab  Services Pager  [phone]    

## 2011-08-01 NOTE — Progress Notes (Signed)
Occupational Therapy Treatment Patient Details Name: [name] MRN: [id number] DOB: [birth year] Today's Date: 08/01/2011 Time: 4098-1191 OT Time Calculation (min): 29 min  OT Assessment / Plan / Recommendation Comments on Treatment Session Unable to perform pendulums at this time due to pt fatigue and pain.  Pt will benefit from ST SNF before return home.    Follow Up Recommendations  Skilled nursing facility    Barriers to Discharge       Equipment Recommendations  Defer to next venue    Recommendations for Other Services    Frequency Min 2X/week   Plan Discharge plan remains appropriate    Precautions / Restrictions Precautions Precautions: Shoulder Type of Shoulder Precautions: No PROM. A/AAROM OK. May use for RW. Limit weight bearing through RUE. Required Braces or Orthoses: Other Brace/Splint Other Brace/Splint: sling Restrictions Weight Bearing Restrictions: Yes RUE Weight Bearing: Partial weight bearing RUE Partial Weight Bearing Percentage or Pounds: for RW. limit wt bearing   Pertinent Vitals/Pain See vitals    ADL  Upper Body Dressing: Performed;Maximal assistance Where Assessed - Upper Body Dressing: Supported sitting Toilet Transfer: Simulated;+2 Total assistance Toilet Transfer: Patient Percentage: 40% Toilet Transfer Method: Stand pivot Acupuncturist: Other (comment) (EOB to recliner) Equipment Used: Gait belt;Rolling walker (sling) Transfers/Ambulation Related to ADLs: Pt requires +2 assist to maintain upright posture while slowly moving feet for pivot.  Pt with LUE on RW but unable to place right hand on RW due to pain. ADL Comments: Pt donned/doffed right sling with max assist and verbal cueing for sequencing.    OT Diagnosis:    OT Problem List:   OT Treatment Interventions:     OT Goals ADL Goals Pt Will Perform Upper Body Dressing: with mod assist;Sitting, chair;Supported;with cueing (comment type and amount) ADL Goal:  Upper Body Dressing - Progress: Progressing toward goals Pt Will Transfer to Toilet: with mod assist;Stand pivot transfer;with DME;3-in-1;Ambulation;Maintaining weight bearing status;with cueing (comment type and amount) ADL Goal: Toilet Transfer - Progress: Progressing toward goals Arm Goals Pt Will Perform AROM: Right upper extremity;with minimal assist;1 set;10 reps Arm Goal: AROM - Progress: Progressing toward goal  Visit Information  Last OT Received On: 08/01/11 Assistance Needed: +2    Subjective Data      Prior Functioning       Cognition  Overall Cognitive Status: Impaired Area of Impairment: Safety/judgement;Problem solving Arousal/Alertness: Awake/alert Orientation Level: Appears intact for tasks assessed Behavior During Session: [facility] for tasks performed Safety/Judgement: Decreased awareness of safety precautions Cognition - Other Comments: delayed problem solving/processing. Appears to have STM deficits.    Mobility Bed Mobility Bed Mobility: Supine to Sit;Sitting - Scoot to Edge of Bed Supine to Sit: 1: +2 Total assist;HOB flat Supine to Sit: Patient Percentage: 30% Sitting - Scoot to Edge of Bed: 1: +1 Total assist Details for Bed Mobility Assistance: pt @ 10% Transfers Transfers: Sit to Stand;Stand to Sit Sit to Stand: 1: +2 Total assist;With upper extremity assist;From bed Sit to Stand: Patient Percentage: 40% Stand to Sit: 1: +2 Total assist;With upper extremity assist;To bed;To chair/3-in-1 Stand to Sit: Patient Percentage: 40% Details for Transfer Assistance: Patient unable to reach fully upright position.  Required +2 assist to remain upright.  Able to move each foot one step to try to pivot to chair.  Moved chair behind patient to sit.  Required +2 assist to slowly lower to chair.   Exercises General Exercises - Upper Extremity Elbow Flexion: AROM;AAROM;10 reps;Right;Seated Elbow Extension: AROM;AAROM;Right;10 reps;Seated Wrist Flexion: AROM;Right;10  reps;Seated Wrist Extension: AROM;Right;10 reps;Seated Digit Composite Flexion: AROM;Right;10 reps;Seated Composite Extension: AROM;Right;10 reps;Seated General Exercises - Lower Extremity Ankle Circles/Pumps: AROM;Both;10 reps;Seated Long Arc Quad: AROM;Both;10 reps;Seated Hip Flexion/Marching: AROM;Both;10 reps;Seated  Balance Balance Balance Assessed: Yes Static Sitting Balance Static Sitting - Balance Support: Left upper extremity supported Static Sitting - Level of Assistance: 4: Min assist Static Sitting - Comment/# of Minutes: Able to maintain upright sitting balance without UE support for ~30 seconds.  End of Session OT - End of Session Equipment Utilized During Treatment: Gait belt;Other (comment) (sling) Activity Tolerance: Patient tolerated treatment well Patient left: in chair;with call bell/phone within reach;with family/visitor present Nurse Communication: Mobility status;Need for lift equipment  08/01/2011 Cipriano Mile OTR/L Pager [phone] Office [phone]  Cipriano Mile 08/01/2011, 10:13 AM

## 2011-08-02 ENCOUNTER — Encounter (HOSPITAL_COMMUNITY): Payer: Self-pay | Admitting: Orthopedic Surgery

## 2011-08-02 DIAGNOSIS — E785 Hyperlipidemia, unspecified: Secondary | ICD-10-CM | POA: Diagnosis not present

## 2011-08-02 DIAGNOSIS — M25539 Pain in unspecified wrist: Secondary | ICD-10-CM | POA: Diagnosis not present

## 2011-08-02 DIAGNOSIS — M719 Bursopathy, unspecified: Secondary | ICD-10-CM | POA: Diagnosis not present

## 2011-08-02 DIAGNOSIS — I10 Essential (primary) hypertension: Secondary | ICD-10-CM | POA: Diagnosis not present

## 2011-08-02 DIAGNOSIS — G609 Hereditary and idiopathic neuropathy, unspecified: Secondary | ICD-10-CM | POA: Diagnosis not present

## 2011-08-02 DIAGNOSIS — K5730 Diverticulosis of large intestine without perforation or abscess without bleeding: Secondary | ICD-10-CM | POA: Diagnosis not present

## 2011-08-02 DIAGNOSIS — M25519 Pain in unspecified shoulder: Secondary | ICD-10-CM | POA: Diagnosis not present

## 2011-08-02 DIAGNOSIS — K5900 Constipation, unspecified: Secondary | ICD-10-CM | POA: Diagnosis not present

## 2011-08-02 DIAGNOSIS — R32 Unspecified urinary incontinence: Secondary | ICD-10-CM | POA: Diagnosis not present

## 2011-08-02 DIAGNOSIS — I517 Cardiomegaly: Secondary | ICD-10-CM | POA: Diagnosis not present

## 2011-08-02 DIAGNOSIS — M67919 Unspecified disorder of synovium and tendon, unspecified shoulder: Secondary | ICD-10-CM | POA: Diagnosis not present

## 2011-08-02 DIAGNOSIS — M545 Low back pain: Secondary | ICD-10-CM | POA: Diagnosis not present

## 2011-08-02 DIAGNOSIS — G589 Mononeuropathy, unspecified: Secondary | ICD-10-CM | POA: Diagnosis not present

## 2011-08-02 DIAGNOSIS — Z5189 Encounter for other specified aftercare: Secondary | ICD-10-CM | POA: Diagnosis not present

## 2011-08-02 DIAGNOSIS — S4980XA Other specified injuries of shoulder and upper arm, unspecified arm, initial encounter: Secondary | ICD-10-CM | POA: Diagnosis not present

## 2011-08-02 DIAGNOSIS — M19019 Primary osteoarthritis, unspecified shoulder: Secondary | ICD-10-CM | POA: Diagnosis not present

## 2011-08-02 DIAGNOSIS — Z96619 Presence of unspecified artificial shoulder joint: Secondary | ICD-10-CM | POA: Diagnosis not present

## 2011-08-02 MED ORDER — METHOCARBAMOL 500 MG PO TABS: 500.0000 mg | ORAL_TABLET | Freq: Four times a day (QID) | ORAL | Status: AC | PRN

## 2011-08-02 MED ORDER — HYDROCODONE-ACETAMINOPHEN 5-325 MG PO TABS: 1.0000 | ORAL_TABLET | Freq: Four times a day (QID) | ORAL | Status: AC | PRN

## 2011-08-02 NOTE — Clinical Social Work Psychosocial (Signed)
     Clinical Social Work Department BRIEF PSYCHOSOCIAL ASSESSMENT 08/02/2011  Patient:  [name], [name]     Account Number:  [id number]     Admit date:  07/30/2011  Clinical Social Worker:  Pearson Forster  Date/Time:  08/02/2011 10:44 AM  Referred by:  Physician  Date Referred:  08/02/2011 Referred for  SNF Placement   Other Referral:   Interview type:  Patient Other interview type:   Patient daughter a resource of information at the bedside    PSYCHOSOCIAL DATA Living Status:  HUSBAND Admitted from facility:   Level of care:   Primary support name:  Jerrye, [place]  [phone]  [phone] Primary support relationship to patient:  SPOUSE Degree of support available:   Strong support from both spouse and children    CURRENT CONCERNS Current Concerns  Post-Acute Placement   Other Concerns:    SOCIAL WORK ASSESSMENT / PLAN Clinical Social Worker met with patient and patient daughter at bedside to discuss patient plans at discharge. Patient and patient daughter both state that patient is pre-registered at [facility] and the facility has already been to the hospital to finalize paperwork.  CSW contacted [facility] who confirmed patient discharge plans and are ready to admit patient 08/02/2011.  Patient would prefer to transfer by ambulance at discharge.  CSW to arrange ambulance transport once discharge order placed.    Clinical Social Worker to facilitate necessary discharge needs and offer support.   Assessment/plan status:  Psychosocial Support/Ongoing Assessment of Needs Other assessment/ plan:   Information/referral to community resources:   Patient states that her and her family looked into facilities prior to surgery to determine her rehab options. Patient is happy with the decision to go to [facility] at discharge.  CSW to arrange ambulance transport with [facility].    PATIENTS/FAMILYS RESPONSE TO PLAN OF CARE: Patient alert and oriented x3.   Patient and patient family very proactive in patient care and rehab options.  Patient with good local support.  Patient arrangements made for SNF prior to admission - patient family appreciative for CSW facilitation of discharge plans.

## 2011-08-02 NOTE — Progress Notes (Signed)
Physical Therapy Treatment Note   08/02/11 0919  PT Visit Information  Last PT Received On 08/02/11  Assistance Needed +2  PT Time Calculation  PT Start Time 0919  PT Stop Time 0939  PT Time Calculation (min) 20 min  Subjective Data  Subjective Pt received supine in bed agreeable to transfer to chair.  Precautions  Precautions Shoulder  Type of Shoulder Precautions No PROM. A/AAROM OK. May use for RW. Limit weight bearing through RUE.  Other Brace/Splint sling  Restrictions  RUE Weight Bearing PWB  RUE Partial Weight Bearing Percentage or Pounds for RW. limit wt bearing  Cognition  Overall Cognitive Status Impaired  Area of Impairment Safety/judgement;Problem solving  Arousal/Alertness Awake/alert  Orientation Level Appears intact for tasks assessed  Behavior During Session [facility] for tasks performed  Safety/Judgement Decreased awareness of safety precautions  Cognition - Other Comments delayed processing, limited comprehension  Bed Mobility  Bed Mobility Supine to Sit;Sitting - Scoot to Edge of Bed  Supine to Sit 1: +2 Total assist;HOB flat  Supine to Sit: Patient Percentage 50%  Sitting - Scoot to Edge of Bed 1: +1 Total assist  Details for Bed Mobility Assistance pt able to bring bilat LEs to EOB with increased time  Transfers  Transfers Sit to Stand;Stand to Sit;Stand Pivot Transfers  Sit to Stand 1: +2 Total assist;With upper extremity assist;From bed  Sit to Stand: Patient Percentage 40%  Stand to Sit 1: +2 Total assist;With upper extremity assist;To bed;To chair/3-in-1  Stand to Sit: Patient Percentage 40%  Stand Pivot Transfers 1: +2 Total assist  Stand Pivot Transfers: Patient Percentage 40%  Details for Transfer Assistance pt stood x 2 trials, pt with max directional v/c's to sequence LEs. Pt with no functional use of R UE.  Ambulation/Gait  Ambulation/Gait Assistance Not tested (comment)  General Exercises - Lower Extremity  Ankle Circles/Pumps AROM;Both;10  reps;Seated  Long Arc Quad AROM;Both;10 reps;Seated  Toe Raises AROM;Both;10 reps;Seated  PT - End of Session  Equipment Utilized During Treatment Gait belt  Activity Tolerance Patient limited by fatigue  Patient left in chair;with call bell/phone within reach;with family/visitor present  Nurse Communication Mobility status;Need for lift equipment  PT - Assessment/Plan  Comments on Treatment Session Patient con't to require maxA for all transfers/mobilty. Pt unable to advance ambulation at this time due to limited R UE function.  PT Plan Discharge plan remains appropriate;Frequency remains appropriate  PT Frequency Min 3X/week  Follow Up Recommendations Skilled nursing facility  Equipment Recommended Defer to next venue  Acute Rehab PT Goals  PT Goal Formulation With patient/family  Time For Goal Achievement 08/14/11  Potential to Achieve Goals Good  PT Goal: Supine/Side to Sit - Progress Progressing toward goal  PT Goal: Sit to Supine/Side - Progress Progressing toward goal  PT Goal: Sit to Stand - Progress Progressing toward goal  PT Goal: Stand to Sit - Progress Progressing toward goal  PT Transfer Goal: Bed to Chair/Chair to Bed - Progress Progressing toward goal  PT Goal: Ambulate - Progress Not progressing  PT General Charges  $$ ACUTE PT VISIT 1 Procedure  PT Treatments  $Therapeutic Activity 8-22 mins     Pain: Pt denies pain until slight mvmt of R shoulder  Lewis Shock, PT, DPT Pager #: [phone] Office #: [phone]

## 2011-08-02 NOTE — Discharge Summary (Signed)
Physician Discharge Summary  Patient ID: [name] MRN: [id number] DOB/AGE: 1927-02-26 78 y.o.  Admit date: 07/30/2011 Discharge date: 08/02/2011  Admission Diagnoses:  Right shoulder rotator cuff arthropathy  Discharge Diagnoses:  Same   Surgeries: Procedure(s):right TOTAL SHOULDER ARTHROPLASTY on 07/30/2011   Consultants: PT/OT  Discharged Condition: Stable  Hospital Course: [name] is an 78 y.o. female who was admitted 07/30/2011 with a chief complaint of No chief complaint on file. , and found to have a diagnosis of <principal problem not specified>.  They were brought to the operating room on 07/30/2011 and underwent the above named procedures.    The patient had an uncomplicated hospital course and was stable for discharge.  Recent vital signs:  Filed Vitals:   08/02/11 0605  BP: 127/68  Pulse: 100  Temp: 97.7 F (36.5 C)  Resp: 16    Recent laboratory studies:  Results for orders placed during the hospital encounter of 07/26/11  BASIC METABOLIC PANEL      Component Value Range   Sodium 138  135 - 145 mEq/L   Potassium 4.1  3.5 - 5.1 mEq/L   Chloride 103  96 - 112 mEq/L   CO2 26  19 - 32 mEq/L   Glucose, Bld 90  70 - 99 mg/dL   BUN 17  6 - 23 mg/dL   Creatinine, Ser 4.09  0.50 - 1.10 mg/dL   Calcium 9.4  8.4 - 81.1 mg/dL   GFR calc non Af Amer 85 (*) >90 mL/min   GFR calc Af Amer >90  >90 mL/min  CBC      Component Value Range   WBC 6.1  4.0 - 10.5 K/uL   RBC 4.97  3.87 - 5.11 MIL/uL   Hemoglobin 13.9  12.0 - 15.0 g/dL   HCT 91.4  78.2 - 95.6 %   MCV 83.7  78.0 - 100.0 fL   MCH 28.0  26.0 - 34.0 pg   MCHC 33.4  30.0 - 36.0 g/dL   RDW 21.3 (*) 08.6 - 57.8 %   Platelets 238  150 - 400 K/uL  TYPE AND SCREEN      Component Value Range   ABO/RH(D) O POS     Antibody Screen NEG     Sample Expiration 08/09/2011    SURGICAL PCR SCREEN      Component Value Range   MRSA, PCR NEGATIVE  NEGATIVE   Staphylococcus aureus NEGATIVE   NEGATIVE    Discharge Medications:   Medication List  As of 08/02/2011 10:46 AM   TAKE these medications         aspirin 325 MG tablet   Take 325 mg by mouth daily.      cyclobenzaprine 10 MG tablet   Commonly known as: FLEXERIL   Take 1 tablet (10 mg total) by mouth at bedtime.      docusate sodium 100 MG capsule   Commonly known as: COLACE   Take 100 mg by mouth 2 (two) times daily.      esomeprazole 40 MG capsule   Commonly known as: NEXIUM   Take 1 capsule (40 mg total) by mouth daily before breakfast.      fish oil-omega-3 fatty acids 1000 MG capsule   Take 1 g by mouth 2 (two) times daily.      gabapentin 300 MG capsule   Commonly known as: NEURONTIN   Take 1 capsule (300 mg total) by mouth at bedtime.      hydrochlorothiazide  25 MG tablet   Commonly known as: HYDRODIURIL   Take 1 tablet (25 mg total) by mouth daily.      HYDROcodone-acetaminophen 5-325 MG per tablet   Commonly known as: NORCO   Take 1 tablet by mouth every 6 (six) hours as needed for pain.      HYDROcodone-acetaminophen 5-325 MG per tablet   Commonly known as: NORCO   Take 1 tablet by mouth every 6 (six) hours as needed for pain.      methocarbamol 500 MG tablet   Commonly known as: ROBAXIN   Take 1 tablet (500 mg total) by mouth 3 (three) times daily as needed.      methocarbamol 500 MG tablet   Commonly known as: ROBAXIN   Take 1 tablet (500 mg total) by mouth every 6 (six) hours as needed.      multivitamin with minerals Tabs   Take 1 tablet by mouth daily.      traMADol 50 MG tablet   Commonly known as: ULTRAM   Take 50 mg by mouth every 6 (six) hours as needed. For pain      Trospium Chloride 60 MG Cp24   Take 1 capsule (60 mg total) by mouth daily.            Diagnostic Studies: Dg Chest 2 View  07/26/2011  *RADIOLOGY REPORT*  Clinical Data: Preoperative assessment for right shoulder replacement  CHEST - 2 VIEW  Comparison: Jun 24, 2010  Findings: Borderline enlargement of  cardiac silhouette. Atherosclerotic calcification aorta. Pulmonary vascularity normal. Large hiatal hernia with prominent air fluid level. Low lung volumes with bibasilar atelectasis. Upper lungs clear. No pleural effusion or pneumothorax. Interval left shoulder joint replacement. Advanced degenerative changes right glenohumeral joint. Bones appear demineralized.  IMPRESSION: Large hiatal hernia. Bibasilar atelectasis.  Original Report Authenticated By: Lollie Marrow, M.D.   Dg Shoulder Right Port  07/30/2011  *RADIOLOGY REPORT*  Clinical Data: Status post total shoulder arthroplasty.  PORTABLE RIGHT SHOULDER - 2+ VIEW  Comparison: Chest radiographs 07/26/2011.  Findings: 1142 hours.  Patient is status post reverse arthroplasty of the right shoulder.  Hardware appears well positioned.  There is gas within the soft tissues surrounding the shoulder.  No fractures are identified. There is right basilar atelectasis.  IMPRESSION: No demonstrated complication following right shoulder reverse arthroplasty.  Original Report Authenticated By: Gerrianne Scale, M.D.    Disposition: 03-Skilled Nursing Facility  Discharge Orders    Future Orders Please Complete By Expires   Diet - low sodium heart healthy      Call MD / Call 911      Comments:   If you experience chest pain or shortness of breath, CALL 911 and be transported to the hospital emergency room.  If you develope a fever above 101 F, pus (white drainage) or increased drainage or redness at the wound, or calf pain, call your surgeon's office.   Constipation Prevention      Comments:   Drink plenty of fluids.  Prune juice may be helpful.  You may use a stool softener, such as Colace (over the counter) 100 mg twice a day.  Use MiraLax (over the counter) for constipation as needed.   Increase activity slowly as tolerated         Follow-up Information    Follow up with NORRIS,STEVEN R, MD. Call in 2 weeks. [phone])    Contact information:    [facility] [address], [address]  [place] [address] [phone]           Signed: Thea Gist 08/02/2011, 10:46 AM

## 2011-08-02 NOTE — Progress Notes (Signed)
Orthopedics Progress Note  Subjective: Pt doing well Minimal pain to right shoulder ready for snf  Objective:  Filed Vitals:   08/02/11 0605  BP: 127/68  Pulse: 100  Temp: 97.7 F (36.5 C)  Resp: 16    General: Awake and alert  Musculoskeletal: right shoulder incision healing well, nv intact distally Neurovascularly intact  Lab Results  Component Value Date   WBC 6.1 07/26/2011   HGB 13.9 07/26/2011   HCT 41.6 07/26/2011   MCV 83.7 07/26/2011   PLT 238 07/26/2011       Component Value Date/Time   NA 138 07/26/2011 1104   K 4.1 07/26/2011 1104   CL 103 07/26/2011 1104   CO2 26 07/26/2011 1104   GLUCOSE 90 07/26/2011 1104   BUN 17 07/26/2011 1104   CREATININE 0.57 07/26/2011 1104   CALCIUM 9.4 07/26/2011 1104   GFRNONAA 85* 07/26/2011 1104   GFRAA >90 07/26/2011 1104    Lab Results  Component Value Date   INR 3.9 11/30/2010   INR 2.7 11/16/2010   INR 1.3 11/13/2010    Assessment/Plan: POD #3 s/p Procedure(s):right TOTAL SHOULDER ARTHROPLASTY  D/c to snf today F/u in 2 weeks  Viviann Spare R. Ranell Patrick, MD 08/02/2011 10:42 AM

## 2011-08-02 NOTE — Clinical Social Work Placement (Signed)
     Clinical Social Work Department CLINICAL SOCIAL WORK PLACEMENT NOTE 08/02/2011  Patient:  [name], [name]  Account Number:  [id number] Admit date:  07/30/2011  Clinical Social Worker:  Pearson Forster  Date/time:  08/02/2011 10:44 AM  Clinical Social Work is seeking post-discharge placement for this patient at the following level of care:   SKILLED NURSING   (*CSW will update this form in Epic as items are completed)     Patient/family provided with [facility] Department of Clinical Social Works list of facilities offering this level of care within the geographic area requested by the patient (or if unable, by the patients family).  08/02/2011  Patient/family informed of their freedom to choose among providers that offer the needed level of care, that participate in Medicare, Medicaid or managed care program needed by the patient, have an available bed and are willing to accept the patient.    Patient/family informed of [facility] ownership interest in [facility], as well as of the fact that they are under no obligation to receive care at this facility.  PASARR submitted to EDS on 05/05/2011 PASARR number received from EDS on 05/05/2011  FL2 transmitted to all facilities in geographic area requested by pt/family on  08/02/2011 FL2 transmitted to all facilities within larger geographic area on   Patient informed that his/her managed care company has contracts with or will negotiate with  certain facilities, including the following:     Patient/family informed of bed offers received:   Patient chooses bed at [facility] Physician recommends and patient chooses bed at  [facility]  Patient to be transferred to [facility] on  08/02/2011 Patient to be transferred to facility by Ambulance  The following physician request were entered in Epic:   Additional Comments: Patient was pre-registered at [facility] with pre-existing Pasarr.

## 2011-08-02 NOTE — Clinical Social Work Note (Signed)
Clinical Social Worker facilitated patient discharge including contacting facility and arranging ambulance transport to [facility].  Patient family at bedside and has completed facility paperwork prior to admission.  RN to call report prior to patient transfer.  Clinical Social Worker will sign off for now as social work intervention is no longer needed. Please consult [place] again if new need arises.  [address] [place], [place] [phone]

## 2011-08-04 DIAGNOSIS — M545 Low back pain: Secondary | ICD-10-CM | POA: Diagnosis not present

## 2011-08-04 DIAGNOSIS — M719 Bursopathy, unspecified: Secondary | ICD-10-CM | POA: Diagnosis not present

## 2011-08-04 DIAGNOSIS — I10 Essential (primary) hypertension: Secondary | ICD-10-CM | POA: Diagnosis not present

## 2011-08-04 DIAGNOSIS — K5900 Constipation, unspecified: Secondary | ICD-10-CM | POA: Diagnosis not present

## 2011-08-04 DIAGNOSIS — M19019 Primary osteoarthritis, unspecified shoulder: Secondary | ICD-10-CM | POA: Diagnosis not present

## 2011-08-10 DIAGNOSIS — K5900 Constipation, unspecified: Secondary | ICD-10-CM | POA: Diagnosis not present

## 2011-08-10 DIAGNOSIS — I10 Essential (primary) hypertension: Secondary | ICD-10-CM | POA: Diagnosis not present

## 2011-08-10 DIAGNOSIS — M25539 Pain in unspecified wrist: Secondary | ICD-10-CM | POA: Diagnosis not present

## 2011-08-16 DIAGNOSIS — M19019 Primary osteoarthritis, unspecified shoulder: Secondary | ICD-10-CM | POA: Diagnosis not present

## 2011-08-30 DIAGNOSIS — I10 Essential (primary) hypertension: Secondary | ICD-10-CM | POA: Diagnosis not present

## 2011-08-30 DIAGNOSIS — G609 Hereditary and idiopathic neuropathy, unspecified: Secondary | ICD-10-CM | POA: Diagnosis not present

## 2011-08-30 DIAGNOSIS — M719 Bursopathy, unspecified: Secondary | ICD-10-CM | POA: Diagnosis not present

## 2011-08-30 DIAGNOSIS — R32 Unspecified urinary incontinence: Secondary | ICD-10-CM | POA: Diagnosis not present

## 2011-08-30 DIAGNOSIS — M67919 Unspecified disorder of synovium and tendon, unspecified shoulder: Secondary | ICD-10-CM | POA: Diagnosis not present

## 2011-08-30 DIAGNOSIS — K5900 Constipation, unspecified: Secondary | ICD-10-CM | POA: Diagnosis not present

## 2011-09-01 ENCOUNTER — Telehealth: Payer: Self-pay | Admitting: *Deleted

## 2011-09-01 NOTE — Telephone Encounter (Signed)
F[facility]ed [facility] requesting authorization to use a covered alternative for Santura XR caps, 60 mg Choices are oxybutin chloride tabs                      oxybutrin CL ER tabs Please advise

## 2011-09-01 NOTE — Telephone Encounter (Signed)
Listed alternatives have higher risk of side effects such as constipation and dry mouth and I would not recommend at this time-esp at her age.

## 2011-09-02 NOTE — Telephone Encounter (Signed)
Request faxed back, denied authorization

## 2011-09-06 ENCOUNTER — Ambulatory Visit: Payer: Self-pay | Admitting: Family

## 2011-09-06 DIAGNOSIS — I2699 Other pulmonary embolism without acute cor pulmonale: Secondary | ICD-10-CM

## 2011-09-06 NOTE — Patient Instructions (Signed)
Therapy completed 12/11/2010 by Dr. Caryl Never

## 2011-09-07 DIAGNOSIS — R32 Unspecified urinary incontinence: Secondary | ICD-10-CM | POA: Diagnosis not present

## 2011-09-07 DIAGNOSIS — M719 Bursopathy, unspecified: Secondary | ICD-10-CM | POA: Diagnosis not present

## 2011-09-07 DIAGNOSIS — I10 Essential (primary) hypertension: Secondary | ICD-10-CM | POA: Diagnosis not present

## 2011-09-07 DIAGNOSIS — M67919 Unspecified disorder of synovium and tendon, unspecified shoulder: Secondary | ICD-10-CM | POA: Diagnosis not present

## 2011-09-13 DIAGNOSIS — M19019 Primary osteoarthritis, unspecified shoulder: Secondary | ICD-10-CM | POA: Diagnosis not present

## 2011-09-16 DIAGNOSIS — K5730 Diverticulosis of large intestine without perforation or abscess without bleeding: Secondary | ICD-10-CM | POA: Diagnosis not present

## 2011-09-16 DIAGNOSIS — E785 Hyperlipidemia, unspecified: Secondary | ICD-10-CM | POA: Diagnosis not present

## 2011-09-16 DIAGNOSIS — G609 Hereditary and idiopathic neuropathy, unspecified: Secondary | ICD-10-CM | POA: Diagnosis not present

## 2011-09-16 DIAGNOSIS — K5900 Constipation, unspecified: Secondary | ICD-10-CM | POA: Diagnosis not present

## 2011-09-16 DIAGNOSIS — Z5189 Encounter for other specified aftercare: Secondary | ICD-10-CM | POA: Diagnosis not present

## 2011-09-16 DIAGNOSIS — Z96619 Presence of unspecified artificial shoulder joint: Secondary | ICD-10-CM | POA: Diagnosis not present

## 2011-09-16 DIAGNOSIS — I517 Cardiomegaly: Secondary | ICD-10-CM | POA: Diagnosis not present

## 2011-09-16 DIAGNOSIS — M19019 Primary osteoarthritis, unspecified shoulder: Secondary | ICD-10-CM | POA: Diagnosis not present

## 2011-09-16 DIAGNOSIS — G589 Mononeuropathy, unspecified: Secondary | ICD-10-CM | POA: Diagnosis not present

## 2011-09-16 DIAGNOSIS — R32 Unspecified urinary incontinence: Secondary | ICD-10-CM | POA: Diagnosis not present

## 2011-09-16 DIAGNOSIS — M67919 Unspecified disorder of synovium and tendon, unspecified shoulder: Secondary | ICD-10-CM | POA: Diagnosis not present

## 2011-09-16 DIAGNOSIS — I10 Essential (primary) hypertension: Secondary | ICD-10-CM | POA: Diagnosis not present

## 2011-09-22 DIAGNOSIS — M67919 Unspecified disorder of synovium and tendon, unspecified shoulder: Secondary | ICD-10-CM | POA: Diagnosis not present

## 2011-09-22 DIAGNOSIS — M19019 Primary osteoarthritis, unspecified shoulder: Secondary | ICD-10-CM | POA: Diagnosis not present

## 2011-09-22 DIAGNOSIS — R32 Unspecified urinary incontinence: Secondary | ICD-10-CM | POA: Diagnosis not present

## 2011-09-22 DIAGNOSIS — I10 Essential (primary) hypertension: Secondary | ICD-10-CM | POA: Diagnosis not present

## 2011-09-22 DIAGNOSIS — G609 Hereditary and idiopathic neuropathy, unspecified: Secondary | ICD-10-CM | POA: Diagnosis not present

## 2011-09-22 DIAGNOSIS — K5900 Constipation, unspecified: Secondary | ICD-10-CM | POA: Diagnosis not present

## 2011-09-29 ENCOUNTER — Telehealth: Payer: Self-pay | Admitting: Family Medicine

## 2011-09-29 DIAGNOSIS — M545 Low back pain: Secondary | ICD-10-CM | POA: Diagnosis not present

## 2011-09-29 DIAGNOSIS — Z471 Aftercare following joint replacement surgery: Secondary | ICD-10-CM | POA: Diagnosis not present

## 2011-09-29 DIAGNOSIS — Z96619 Presence of unspecified artificial shoulder joint: Secondary | ICD-10-CM | POA: Diagnosis not present

## 2011-09-29 DIAGNOSIS — M1990 Unspecified osteoarthritis, unspecified site: Secondary | ICD-10-CM | POA: Diagnosis not present

## 2011-09-29 DIAGNOSIS — K219 Gastro-esophageal reflux disease without esophagitis: Secondary | ICD-10-CM | POA: Diagnosis not present

## 2011-09-29 NOTE — Telephone Encounter (Signed)
I spoke with pt husband yesterday calling him about labs, he mentioned the ASA concern and said his daughter planned on calling about it, so no notation was made.  Please advise

## 2011-09-29 NOTE — Telephone Encounter (Signed)
Reduce to 81 mg ASA.

## 2011-09-29 NOTE — Telephone Encounter (Signed)
Pt husband informed

## 2011-09-29 NOTE — Telephone Encounter (Signed)
Caller: [name]/Child; Patient Name: [name]; PCP: Evelena Peat; Best Callback Phone Number: [phone].  [name] spoke with [name] in office yesterday about pt.s bruising, and the fact that she is taking ASA: 325 mg daily.  [name] was to hear back from [place] today, and has not heard back.  No notations found in EPIC regarding this call. Since internal Nurse took message with symptoms on call with daughter yesterday, no triage was done.   Message sent to Nursing Pool for review and follow up of previous call.

## 2011-10-01 DIAGNOSIS — Z471 Aftercare following joint replacement surgery: Secondary | ICD-10-CM | POA: Diagnosis not present

## 2011-10-01 DIAGNOSIS — Z96619 Presence of unspecified artificial shoulder joint: Secondary | ICD-10-CM | POA: Diagnosis not present

## 2011-10-01 DIAGNOSIS — M1990 Unspecified osteoarthritis, unspecified site: Secondary | ICD-10-CM | POA: Diagnosis not present

## 2011-10-01 DIAGNOSIS — K219 Gastro-esophageal reflux disease without esophagitis: Secondary | ICD-10-CM | POA: Diagnosis not present

## 2011-10-01 DIAGNOSIS — M545 Low back pain: Secondary | ICD-10-CM | POA: Diagnosis not present

## 2011-10-04 DIAGNOSIS — Z471 Aftercare following joint replacement surgery: Secondary | ICD-10-CM | POA: Diagnosis not present

## 2011-10-04 DIAGNOSIS — K219 Gastro-esophageal reflux disease without esophagitis: Secondary | ICD-10-CM | POA: Diagnosis not present

## 2011-10-04 DIAGNOSIS — Z96619 Presence of unspecified artificial shoulder joint: Secondary | ICD-10-CM | POA: Diagnosis not present

## 2011-10-04 DIAGNOSIS — M545 Low back pain: Secondary | ICD-10-CM | POA: Diagnosis not present

## 2011-10-04 DIAGNOSIS — M1990 Unspecified osteoarthritis, unspecified site: Secondary | ICD-10-CM | POA: Diagnosis not present

## 2011-10-05 DIAGNOSIS — M1990 Unspecified osteoarthritis, unspecified site: Secondary | ICD-10-CM | POA: Diagnosis not present

## 2011-10-05 DIAGNOSIS — K219 Gastro-esophageal reflux disease without esophagitis: Secondary | ICD-10-CM | POA: Diagnosis not present

## 2011-10-05 DIAGNOSIS — M545 Low back pain: Secondary | ICD-10-CM | POA: Diagnosis not present

## 2011-10-05 DIAGNOSIS — Z96619 Presence of unspecified artificial shoulder joint: Secondary | ICD-10-CM | POA: Diagnosis not present

## 2011-10-05 DIAGNOSIS — Z471 Aftercare following joint replacement surgery: Secondary | ICD-10-CM | POA: Diagnosis not present

## 2011-10-06 DIAGNOSIS — M1990 Unspecified osteoarthritis, unspecified site: Secondary | ICD-10-CM | POA: Diagnosis not present

## 2011-10-06 DIAGNOSIS — Z471 Aftercare following joint replacement surgery: Secondary | ICD-10-CM | POA: Diagnosis not present

## 2011-10-06 DIAGNOSIS — Z96619 Presence of unspecified artificial shoulder joint: Secondary | ICD-10-CM | POA: Diagnosis not present

## 2011-10-06 DIAGNOSIS — K219 Gastro-esophageal reflux disease without esophagitis: Secondary | ICD-10-CM | POA: Diagnosis not present

## 2011-10-06 DIAGNOSIS — M545 Low back pain: Secondary | ICD-10-CM | POA: Diagnosis not present

## 2011-10-07 DIAGNOSIS — M545 Low back pain: Secondary | ICD-10-CM | POA: Diagnosis not present

## 2011-10-07 DIAGNOSIS — M1990 Unspecified osteoarthritis, unspecified site: Secondary | ICD-10-CM | POA: Diagnosis not present

## 2011-10-07 DIAGNOSIS — K219 Gastro-esophageal reflux disease without esophagitis: Secondary | ICD-10-CM | POA: Diagnosis not present

## 2011-10-07 DIAGNOSIS — Z96619 Presence of unspecified artificial shoulder joint: Secondary | ICD-10-CM | POA: Diagnosis not present

## 2011-10-07 DIAGNOSIS — Z471 Aftercare following joint replacement surgery: Secondary | ICD-10-CM | POA: Diagnosis not present

## 2011-10-08 DIAGNOSIS — M545 Low back pain: Secondary | ICD-10-CM | POA: Diagnosis not present

## 2011-10-08 DIAGNOSIS — M1990 Unspecified osteoarthritis, unspecified site: Secondary | ICD-10-CM | POA: Diagnosis not present

## 2011-10-08 DIAGNOSIS — Z471 Aftercare following joint replacement surgery: Secondary | ICD-10-CM | POA: Diagnosis not present

## 2011-10-08 DIAGNOSIS — K219 Gastro-esophageal reflux disease without esophagitis: Secondary | ICD-10-CM | POA: Diagnosis not present

## 2011-10-08 DIAGNOSIS — Z96619 Presence of unspecified artificial shoulder joint: Secondary | ICD-10-CM | POA: Diagnosis not present

## 2011-10-11 ENCOUNTER — Other Ambulatory Visit: Payer: Self-pay | Admitting: *Deleted

## 2011-10-11 DIAGNOSIS — Z471 Aftercare following joint replacement surgery: Secondary | ICD-10-CM | POA: Diagnosis not present

## 2011-10-11 DIAGNOSIS — K219 Gastro-esophageal reflux disease without esophagitis: Secondary | ICD-10-CM | POA: Diagnosis not present

## 2011-10-11 DIAGNOSIS — Z96619 Presence of unspecified artificial shoulder joint: Secondary | ICD-10-CM | POA: Diagnosis not present

## 2011-10-11 DIAGNOSIS — M545 Low back pain: Secondary | ICD-10-CM | POA: Diagnosis not present

## 2011-10-11 DIAGNOSIS — M1990 Unspecified osteoarthritis, unspecified site: Secondary | ICD-10-CM | POA: Diagnosis not present

## 2011-10-11 MED ORDER — TROSPIUM CHLORIDE ER 60 MG PO CP24: 60.0000 mg | ORAL_CAPSULE | Freq: Every day | ORAL | Status: DC

## 2011-10-12 DIAGNOSIS — M545 Low back pain: Secondary | ICD-10-CM | POA: Diagnosis not present

## 2011-10-12 DIAGNOSIS — K219 Gastro-esophageal reflux disease without esophagitis: Secondary | ICD-10-CM | POA: Diagnosis not present

## 2011-10-12 DIAGNOSIS — Z471 Aftercare following joint replacement surgery: Secondary | ICD-10-CM | POA: Diagnosis not present

## 2011-10-12 DIAGNOSIS — Z96619 Presence of unspecified artificial shoulder joint: Secondary | ICD-10-CM | POA: Diagnosis not present

## 2011-10-12 DIAGNOSIS — M1990 Unspecified osteoarthritis, unspecified site: Secondary | ICD-10-CM | POA: Diagnosis not present

## 2011-10-13 DIAGNOSIS — Z471 Aftercare following joint replacement surgery: Secondary | ICD-10-CM | POA: Diagnosis not present

## 2011-10-13 DIAGNOSIS — K219 Gastro-esophageal reflux disease without esophagitis: Secondary | ICD-10-CM | POA: Diagnosis not present

## 2011-10-13 DIAGNOSIS — M545 Low back pain: Secondary | ICD-10-CM | POA: Diagnosis not present

## 2011-10-13 DIAGNOSIS — Z96619 Presence of unspecified artificial shoulder joint: Secondary | ICD-10-CM | POA: Diagnosis not present

## 2011-10-13 DIAGNOSIS — M1990 Unspecified osteoarthritis, unspecified site: Secondary | ICD-10-CM | POA: Diagnosis not present

## 2011-10-15 DIAGNOSIS — Z471 Aftercare following joint replacement surgery: Secondary | ICD-10-CM | POA: Diagnosis not present

## 2011-10-15 DIAGNOSIS — Z96619 Presence of unspecified artificial shoulder joint: Secondary | ICD-10-CM | POA: Diagnosis not present

## 2011-10-15 DIAGNOSIS — K219 Gastro-esophageal reflux disease without esophagitis: Secondary | ICD-10-CM | POA: Diagnosis not present

## 2011-10-15 DIAGNOSIS — M545 Low back pain: Secondary | ICD-10-CM | POA: Diagnosis not present

## 2011-10-15 DIAGNOSIS — M1990 Unspecified osteoarthritis, unspecified site: Secondary | ICD-10-CM | POA: Diagnosis not present

## 2011-10-18 DIAGNOSIS — Z471 Aftercare following joint replacement surgery: Secondary | ICD-10-CM | POA: Diagnosis not present

## 2011-10-18 DIAGNOSIS — Z96619 Presence of unspecified artificial shoulder joint: Secondary | ICD-10-CM | POA: Diagnosis not present

## 2011-10-18 DIAGNOSIS — M545 Low back pain: Secondary | ICD-10-CM | POA: Diagnosis not present

## 2011-10-18 DIAGNOSIS — K219 Gastro-esophageal reflux disease without esophagitis: Secondary | ICD-10-CM | POA: Diagnosis not present

## 2011-10-18 DIAGNOSIS — M1990 Unspecified osteoarthritis, unspecified site: Secondary | ICD-10-CM | POA: Diagnosis not present

## 2011-10-19 DIAGNOSIS — K219 Gastro-esophageal reflux disease without esophagitis: Secondary | ICD-10-CM | POA: Diagnosis not present

## 2011-10-19 DIAGNOSIS — Z96619 Presence of unspecified artificial shoulder joint: Secondary | ICD-10-CM | POA: Diagnosis not present

## 2011-10-19 DIAGNOSIS — M1990 Unspecified osteoarthritis, unspecified site: Secondary | ICD-10-CM | POA: Diagnosis not present

## 2011-10-19 DIAGNOSIS — M545 Low back pain: Secondary | ICD-10-CM | POA: Diagnosis not present

## 2011-10-19 DIAGNOSIS — Z471 Aftercare following joint replacement surgery: Secondary | ICD-10-CM | POA: Diagnosis not present

## 2011-10-20 DIAGNOSIS — M1990 Unspecified osteoarthritis, unspecified site: Secondary | ICD-10-CM | POA: Diagnosis not present

## 2011-10-20 DIAGNOSIS — Z96619 Presence of unspecified artificial shoulder joint: Secondary | ICD-10-CM | POA: Diagnosis not present

## 2011-10-20 DIAGNOSIS — K219 Gastro-esophageal reflux disease without esophagitis: Secondary | ICD-10-CM | POA: Diagnosis not present

## 2011-10-20 DIAGNOSIS — M545 Low back pain: Secondary | ICD-10-CM | POA: Diagnosis not present

## 2011-10-20 DIAGNOSIS — Z471 Aftercare following joint replacement surgery: Secondary | ICD-10-CM | POA: Diagnosis not present

## 2011-10-21 DIAGNOSIS — M1990 Unspecified osteoarthritis, unspecified site: Secondary | ICD-10-CM | POA: Diagnosis not present

## 2011-10-21 DIAGNOSIS — M545 Low back pain: Secondary | ICD-10-CM | POA: Diagnosis not present

## 2011-10-21 DIAGNOSIS — Z96619 Presence of unspecified artificial shoulder joint: Secondary | ICD-10-CM | POA: Diagnosis not present

## 2011-10-21 DIAGNOSIS — K219 Gastro-esophageal reflux disease without esophagitis: Secondary | ICD-10-CM | POA: Diagnosis not present

## 2011-10-21 DIAGNOSIS — Z471 Aftercare following joint replacement surgery: Secondary | ICD-10-CM | POA: Diagnosis not present

## 2011-10-25 DIAGNOSIS — M545 Low back pain: Secondary | ICD-10-CM | POA: Diagnosis not present

## 2011-10-25 DIAGNOSIS — K219 Gastro-esophageal reflux disease without esophagitis: Secondary | ICD-10-CM | POA: Diagnosis not present

## 2011-10-25 DIAGNOSIS — M1990 Unspecified osteoarthritis, unspecified site: Secondary | ICD-10-CM | POA: Diagnosis not present

## 2011-10-25 DIAGNOSIS — Z96619 Presence of unspecified artificial shoulder joint: Secondary | ICD-10-CM | POA: Diagnosis not present

## 2011-10-25 DIAGNOSIS — Z471 Aftercare following joint replacement surgery: Secondary | ICD-10-CM | POA: Diagnosis not present

## 2011-10-26 DIAGNOSIS — Z471 Aftercare following joint replacement surgery: Secondary | ICD-10-CM | POA: Diagnosis not present

## 2011-10-26 DIAGNOSIS — M1990 Unspecified osteoarthritis, unspecified site: Secondary | ICD-10-CM | POA: Diagnosis not present

## 2011-10-26 DIAGNOSIS — M545 Low back pain: Secondary | ICD-10-CM | POA: Diagnosis not present

## 2011-10-26 DIAGNOSIS — K219 Gastro-esophageal reflux disease without esophagitis: Secondary | ICD-10-CM | POA: Diagnosis not present

## 2011-10-26 DIAGNOSIS — Z96619 Presence of unspecified artificial shoulder joint: Secondary | ICD-10-CM | POA: Diagnosis not present

## 2011-10-27 DIAGNOSIS — K219 Gastro-esophageal reflux disease without esophagitis: Secondary | ICD-10-CM | POA: Diagnosis not present

## 2011-10-27 DIAGNOSIS — Z96619 Presence of unspecified artificial shoulder joint: Secondary | ICD-10-CM | POA: Diagnosis not present

## 2011-10-27 DIAGNOSIS — M545 Low back pain: Secondary | ICD-10-CM | POA: Diagnosis not present

## 2011-10-27 DIAGNOSIS — Z471 Aftercare following joint replacement surgery: Secondary | ICD-10-CM | POA: Diagnosis not present

## 2011-10-27 DIAGNOSIS — M1990 Unspecified osteoarthritis, unspecified site: Secondary | ICD-10-CM | POA: Diagnosis not present

## 2011-10-29 DIAGNOSIS — Z96619 Presence of unspecified artificial shoulder joint: Secondary | ICD-10-CM | POA: Diagnosis not present

## 2011-10-29 DIAGNOSIS — Z471 Aftercare following joint replacement surgery: Secondary | ICD-10-CM | POA: Diagnosis not present

## 2011-10-29 DIAGNOSIS — K219 Gastro-esophageal reflux disease without esophagitis: Secondary | ICD-10-CM | POA: Diagnosis not present

## 2011-10-29 DIAGNOSIS — M545 Low back pain: Secondary | ICD-10-CM | POA: Diagnosis not present

## 2011-10-29 DIAGNOSIS — M1990 Unspecified osteoarthritis, unspecified site: Secondary | ICD-10-CM | POA: Diagnosis not present

## 2011-11-01 DIAGNOSIS — K219 Gastro-esophageal reflux disease without esophagitis: Secondary | ICD-10-CM | POA: Diagnosis not present

## 2011-11-01 DIAGNOSIS — Z471 Aftercare following joint replacement surgery: Secondary | ICD-10-CM | POA: Diagnosis not present

## 2011-11-01 DIAGNOSIS — M1990 Unspecified osteoarthritis, unspecified site: Secondary | ICD-10-CM | POA: Diagnosis not present

## 2011-11-01 DIAGNOSIS — M545 Low back pain: Secondary | ICD-10-CM | POA: Diagnosis not present

## 2011-11-01 DIAGNOSIS — Z96619 Presence of unspecified artificial shoulder joint: Secondary | ICD-10-CM | POA: Diagnosis not present

## 2011-11-02 DIAGNOSIS — Z96619 Presence of unspecified artificial shoulder joint: Secondary | ICD-10-CM | POA: Diagnosis not present

## 2011-11-02 DIAGNOSIS — K219 Gastro-esophageal reflux disease without esophagitis: Secondary | ICD-10-CM | POA: Diagnosis not present

## 2011-11-02 DIAGNOSIS — M545 Low back pain: Secondary | ICD-10-CM | POA: Diagnosis not present

## 2011-11-02 DIAGNOSIS — Z471 Aftercare following joint replacement surgery: Secondary | ICD-10-CM | POA: Diagnosis not present

## 2011-11-02 DIAGNOSIS — M1990 Unspecified osteoarthritis, unspecified site: Secondary | ICD-10-CM | POA: Diagnosis not present

## 2011-11-03 DIAGNOSIS — Z96619 Presence of unspecified artificial shoulder joint: Secondary | ICD-10-CM | POA: Diagnosis not present

## 2011-11-03 DIAGNOSIS — M545 Low back pain: Secondary | ICD-10-CM | POA: Diagnosis not present

## 2011-11-03 DIAGNOSIS — Z471 Aftercare following joint replacement surgery: Secondary | ICD-10-CM | POA: Diagnosis not present

## 2011-11-03 DIAGNOSIS — M1990 Unspecified osteoarthritis, unspecified site: Secondary | ICD-10-CM | POA: Diagnosis not present

## 2011-11-03 DIAGNOSIS — K219 Gastro-esophageal reflux disease without esophagitis: Secondary | ICD-10-CM | POA: Diagnosis not present

## 2011-11-04 DIAGNOSIS — M1990 Unspecified osteoarthritis, unspecified site: Secondary | ICD-10-CM | POA: Diagnosis not present

## 2011-11-04 DIAGNOSIS — Z96619 Presence of unspecified artificial shoulder joint: Secondary | ICD-10-CM | POA: Diagnosis not present

## 2011-11-04 DIAGNOSIS — M545 Low back pain: Secondary | ICD-10-CM | POA: Diagnosis not present

## 2011-11-04 DIAGNOSIS — Z471 Aftercare following joint replacement surgery: Secondary | ICD-10-CM | POA: Diagnosis not present

## 2011-11-04 DIAGNOSIS — K219 Gastro-esophageal reflux disease without esophagitis: Secondary | ICD-10-CM | POA: Diagnosis not present

## 2011-11-08 DIAGNOSIS — K219 Gastro-esophageal reflux disease without esophagitis: Secondary | ICD-10-CM | POA: Diagnosis not present

## 2011-11-08 DIAGNOSIS — Z471 Aftercare following joint replacement surgery: Secondary | ICD-10-CM | POA: Diagnosis not present

## 2011-11-08 DIAGNOSIS — M545 Low back pain: Secondary | ICD-10-CM | POA: Diagnosis not present

## 2011-11-08 DIAGNOSIS — M1990 Unspecified osteoarthritis, unspecified site: Secondary | ICD-10-CM | POA: Diagnosis not present

## 2011-11-08 DIAGNOSIS — Z96619 Presence of unspecified artificial shoulder joint: Secondary | ICD-10-CM | POA: Diagnosis not present

## 2011-11-09 DIAGNOSIS — M1990 Unspecified osteoarthritis, unspecified site: Secondary | ICD-10-CM | POA: Diagnosis not present

## 2011-11-09 DIAGNOSIS — K219 Gastro-esophageal reflux disease without esophagitis: Secondary | ICD-10-CM | POA: Diagnosis not present

## 2011-11-09 DIAGNOSIS — M545 Low back pain: Secondary | ICD-10-CM | POA: Diagnosis not present

## 2011-11-09 DIAGNOSIS — Z96619 Presence of unspecified artificial shoulder joint: Secondary | ICD-10-CM | POA: Diagnosis not present

## 2011-11-09 DIAGNOSIS — Z471 Aftercare following joint replacement surgery: Secondary | ICD-10-CM | POA: Diagnosis not present

## 2011-11-10 DIAGNOSIS — M545 Low back pain: Secondary | ICD-10-CM | POA: Diagnosis not present

## 2011-11-10 DIAGNOSIS — M1990 Unspecified osteoarthritis, unspecified site: Secondary | ICD-10-CM | POA: Diagnosis not present

## 2011-11-10 DIAGNOSIS — Z471 Aftercare following joint replacement surgery: Secondary | ICD-10-CM | POA: Diagnosis not present

## 2011-11-10 DIAGNOSIS — Z96619 Presence of unspecified artificial shoulder joint: Secondary | ICD-10-CM | POA: Diagnosis not present

## 2011-11-10 DIAGNOSIS — K219 Gastro-esophageal reflux disease without esophagitis: Secondary | ICD-10-CM | POA: Diagnosis not present

## 2011-11-11 DIAGNOSIS — Z96619 Presence of unspecified artificial shoulder joint: Secondary | ICD-10-CM | POA: Diagnosis not present

## 2011-11-11 DIAGNOSIS — Z471 Aftercare following joint replacement surgery: Secondary | ICD-10-CM | POA: Diagnosis not present

## 2011-11-11 DIAGNOSIS — M545 Low back pain: Secondary | ICD-10-CM | POA: Diagnosis not present

## 2011-11-11 DIAGNOSIS — K219 Gastro-esophageal reflux disease without esophagitis: Secondary | ICD-10-CM | POA: Diagnosis not present

## 2011-11-11 DIAGNOSIS — M1990 Unspecified osteoarthritis, unspecified site: Secondary | ICD-10-CM | POA: Diagnosis not present

## 2011-11-15 DIAGNOSIS — M545 Low back pain: Secondary | ICD-10-CM | POA: Diagnosis not present

## 2011-11-15 DIAGNOSIS — Z471 Aftercare following joint replacement surgery: Secondary | ICD-10-CM | POA: Diagnosis not present

## 2011-11-15 DIAGNOSIS — K219 Gastro-esophageal reflux disease without esophagitis: Secondary | ICD-10-CM | POA: Diagnosis not present

## 2011-11-15 DIAGNOSIS — M1990 Unspecified osteoarthritis, unspecified site: Secondary | ICD-10-CM | POA: Diagnosis not present

## 2011-11-15 DIAGNOSIS — Z96619 Presence of unspecified artificial shoulder joint: Secondary | ICD-10-CM | POA: Diagnosis not present

## 2011-11-17 DIAGNOSIS — M545 Low back pain: Secondary | ICD-10-CM | POA: Diagnosis not present

## 2011-11-17 DIAGNOSIS — Z96619 Presence of unspecified artificial shoulder joint: Secondary | ICD-10-CM | POA: Diagnosis not present

## 2011-11-17 DIAGNOSIS — K219 Gastro-esophageal reflux disease without esophagitis: Secondary | ICD-10-CM | POA: Diagnosis not present

## 2011-11-17 DIAGNOSIS — Z471 Aftercare following joint replacement surgery: Secondary | ICD-10-CM | POA: Diagnosis not present

## 2011-11-17 DIAGNOSIS — M19019 Primary osteoarthritis, unspecified shoulder: Secondary | ICD-10-CM | POA: Diagnosis not present

## 2011-11-17 DIAGNOSIS — M1710 Unilateral primary osteoarthritis, unspecified knee: Secondary | ICD-10-CM | POA: Diagnosis not present

## 2011-11-17 DIAGNOSIS — M1990 Unspecified osteoarthritis, unspecified site: Secondary | ICD-10-CM | POA: Diagnosis not present

## 2011-11-19 ENCOUNTER — Telehealth: Payer: Self-pay | Admitting: Family Medicine

## 2011-11-19 MED ORDER — TROSPIUM CHLORIDE ER 60 MG PO CP24: 60.0000 mg | ORAL_CAPSULE | Freq: Every day | ORAL | Status: DC

## 2011-11-19 NOTE — Telephone Encounter (Signed)
Patient called stating that she has run out of her Sanctura 60mg  and [facility] and also 2 week worth [facility] [place] until she can get this. Please assist.

## 2011-11-22 ENCOUNTER — Telehealth: Payer: Self-pay | Admitting: Family Medicine

## 2011-11-22 NOTE — Telephone Encounter (Signed)
Caller: Bernadett/Patient; Patient Name: [name]; PCP: Evelena Peat [facility]); Best Callback Phone Number: [phone]. Pt's script for Trospium- Chlor (SANCTURA XR) 60 MG CP24 is being filled by [facility]. Pt is out of medication and is requesting a script callled in to [facility] [place]  [phone].  Pt is concerned about the script being in generic name and prefers to have the name brand. No symptoms, no triage.    PLEASE FOLLOW UP WITH PT/PHARMACY REGARDING SCRIPT.  Thank you .

## 2011-11-22 NOTE — Telephone Encounter (Signed)
This med was already called in last Friday. Pt informed and she states the med is expensive, $130.00 for 30 day supply for Sanctura XR caps  Received fax from [facility] requesting covered alternative Oxybutynin chloride tabs or Oxybutynin CL ER tabs

## 2011-11-23 DIAGNOSIS — K219 Gastro-esophageal reflux disease without esophagitis: Secondary | ICD-10-CM | POA: Diagnosis not present

## 2011-11-23 DIAGNOSIS — M1990 Unspecified osteoarthritis, unspecified site: Secondary | ICD-10-CM | POA: Diagnosis not present

## 2011-11-23 DIAGNOSIS — Z471 Aftercare following joint replacement surgery: Secondary | ICD-10-CM | POA: Diagnosis not present

## 2011-11-23 DIAGNOSIS — Z96619 Presence of unspecified artificial shoulder joint: Secondary | ICD-10-CM | POA: Diagnosis not present

## 2011-11-23 DIAGNOSIS — M545 Low back pain: Secondary | ICD-10-CM | POA: Diagnosis not present

## 2011-11-23 NOTE — Telephone Encounter (Signed)
The suggested alternatives can have higher risk of side effects such as constipation and dry mouth.  I would suggest trial of Vesicare 5 mg once daily.

## 2011-11-24 ENCOUNTER — Other Ambulatory Visit: Payer: Self-pay | Admitting: Family Medicine

## 2011-11-24 MED ORDER — SOLIFENACIN SUCCINATE 5 MG PO TABS: 5.0000 mg | ORAL_TABLET | Freq: Every day | ORAL | Status: DC

## 2011-11-24 MED ORDER — CYCLOBENZAPRINE HCL 10 MG PO TABS: 10.0000 mg | ORAL_TABLET | Freq: Every day | ORAL | Status: DC

## 2011-11-24 NOTE — Telephone Encounter (Signed)
Pt needs refill cyclobenzaprine 10 mg #90  W/[facility]

## 2011-11-25 DIAGNOSIS — M1990 Unspecified osteoarthritis, unspecified site: Secondary | ICD-10-CM | POA: Diagnosis not present

## 2011-11-25 DIAGNOSIS — Z96619 Presence of unspecified artificial shoulder joint: Secondary | ICD-10-CM | POA: Diagnosis not present

## 2011-11-25 DIAGNOSIS — M545 Low back pain: Secondary | ICD-10-CM | POA: Diagnosis not present

## 2011-11-25 DIAGNOSIS — K219 Gastro-esophageal reflux disease without esophagitis: Secondary | ICD-10-CM | POA: Diagnosis not present

## 2011-11-25 DIAGNOSIS — Z471 Aftercare following joint replacement surgery: Secondary | ICD-10-CM | POA: Diagnosis not present

## 2011-11-26 NOTE — Telephone Encounter (Signed)
[name], I am just now seeing these phone notes about the [place]. I received a notice over teh weekend that the Sanctura needed prior auth, which may have been why it was so expensive. The prior [name] has been approved as of yesterday, so [facility] [place] - with the prior auth, the SANCTURA XR 60mg  will cost her only $5 per month. Pharmacy said pt wanted to stay on SANCTURA if possible and not use Vesicare. They still have the rx for the [facility] and are going to fill it and call her, if that's ok w/Dr. Caryl Never. Prior [name] is good for life, if she remains on same insurance plan.  Let me know if SANCTURA XR still ok.

## 2011-11-26 NOTE — Telephone Encounter (Signed)
Prior authorization was in the works for [employer] XR, Jeanice Lim was working on it.  Pt can get Sanctura for $5.00 for life and pt is aware and wants to stay in it.  I am going to remove vesicare from med list.  Pharmacy is filling Sanctura for pt. FYI

## 2011-11-28 DIAGNOSIS — Z96619 Presence of unspecified artificial shoulder joint: Secondary | ICD-10-CM | POA: Diagnosis not present

## 2011-11-28 DIAGNOSIS — Z471 Aftercare following joint replacement surgery: Secondary | ICD-10-CM | POA: Diagnosis not present

## 2011-11-28 DIAGNOSIS — M545 Low back pain: Secondary | ICD-10-CM | POA: Diagnosis not present

## 2011-11-28 DIAGNOSIS — Z5189 Encounter for other specified aftercare: Secondary | ICD-10-CM | POA: Diagnosis not present

## 2011-11-28 DIAGNOSIS — M1710 Unilateral primary osteoarthritis, unspecified knee: Secondary | ICD-10-CM | POA: Diagnosis not present

## 2011-11-30 DIAGNOSIS — Z5189 Encounter for other specified aftercare: Secondary | ICD-10-CM | POA: Diagnosis not present

## 2011-11-30 DIAGNOSIS — Z96619 Presence of unspecified artificial shoulder joint: Secondary | ICD-10-CM | POA: Diagnosis not present

## 2011-11-30 DIAGNOSIS — Z471 Aftercare following joint replacement surgery: Secondary | ICD-10-CM | POA: Diagnosis not present

## 2011-11-30 DIAGNOSIS — M1710 Unilateral primary osteoarthritis, unspecified knee: Secondary | ICD-10-CM | POA: Diagnosis not present

## 2011-11-30 DIAGNOSIS — M545 Low back pain: Secondary | ICD-10-CM | POA: Diagnosis not present

## 2011-12-01 DIAGNOSIS — Z5189 Encounter for other specified aftercare: Secondary | ICD-10-CM | POA: Diagnosis not present

## 2011-12-01 DIAGNOSIS — M1710 Unilateral primary osteoarthritis, unspecified knee: Secondary | ICD-10-CM | POA: Diagnosis not present

## 2011-12-01 DIAGNOSIS — Z96619 Presence of unspecified artificial shoulder joint: Secondary | ICD-10-CM | POA: Diagnosis not present

## 2011-12-01 DIAGNOSIS — Z471 Aftercare following joint replacement surgery: Secondary | ICD-10-CM | POA: Diagnosis not present

## 2011-12-01 DIAGNOSIS — M545 Low back pain: Secondary | ICD-10-CM | POA: Diagnosis not present

## 2011-12-06 DIAGNOSIS — Z5189 Encounter for other specified aftercare: Secondary | ICD-10-CM | POA: Diagnosis not present

## 2011-12-06 DIAGNOSIS — M1710 Unilateral primary osteoarthritis, unspecified knee: Secondary | ICD-10-CM | POA: Diagnosis not present

## 2011-12-06 DIAGNOSIS — Z471 Aftercare following joint replacement surgery: Secondary | ICD-10-CM | POA: Diagnosis not present

## 2011-12-06 DIAGNOSIS — M545 Low back pain: Secondary | ICD-10-CM | POA: Diagnosis not present

## 2011-12-06 DIAGNOSIS — Z96619 Presence of unspecified artificial shoulder joint: Secondary | ICD-10-CM | POA: Diagnosis not present

## 2011-12-09 DIAGNOSIS — Z471 Aftercare following joint replacement surgery: Secondary | ICD-10-CM | POA: Diagnosis not present

## 2011-12-09 DIAGNOSIS — M545 Low back pain: Secondary | ICD-10-CM | POA: Diagnosis not present

## 2011-12-09 DIAGNOSIS — Z96619 Presence of unspecified artificial shoulder joint: Secondary | ICD-10-CM | POA: Diagnosis not present

## 2011-12-09 DIAGNOSIS — Z5189 Encounter for other specified aftercare: Secondary | ICD-10-CM | POA: Diagnosis not present

## 2011-12-09 DIAGNOSIS — M1710 Unilateral primary osteoarthritis, unspecified knee: Secondary | ICD-10-CM | POA: Diagnosis not present

## 2011-12-10 DIAGNOSIS — Z471 Aftercare following joint replacement surgery: Secondary | ICD-10-CM | POA: Diagnosis not present

## 2011-12-10 DIAGNOSIS — Z5189 Encounter for other specified aftercare: Secondary | ICD-10-CM | POA: Diagnosis not present

## 2011-12-10 DIAGNOSIS — Z96619 Presence of unspecified artificial shoulder joint: Secondary | ICD-10-CM | POA: Diagnosis not present

## 2011-12-10 DIAGNOSIS — M1710 Unilateral primary osteoarthritis, unspecified knee: Secondary | ICD-10-CM | POA: Diagnosis not present

## 2011-12-10 DIAGNOSIS — M545 Low back pain: Secondary | ICD-10-CM | POA: Diagnosis not present

## 2011-12-13 DIAGNOSIS — M1710 Unilateral primary osteoarthritis, unspecified knee: Secondary | ICD-10-CM | POA: Diagnosis not present

## 2011-12-13 DIAGNOSIS — Z96619 Presence of unspecified artificial shoulder joint: Secondary | ICD-10-CM | POA: Diagnosis not present

## 2011-12-13 DIAGNOSIS — Z471 Aftercare following joint replacement surgery: Secondary | ICD-10-CM | POA: Diagnosis not present

## 2011-12-13 DIAGNOSIS — M545 Low back pain: Secondary | ICD-10-CM | POA: Diagnosis not present

## 2011-12-13 DIAGNOSIS — Z5189 Encounter for other specified aftercare: Secondary | ICD-10-CM | POA: Diagnosis not present

## 2011-12-14 DIAGNOSIS — Z471 Aftercare following joint replacement surgery: Secondary | ICD-10-CM | POA: Diagnosis not present

## 2011-12-14 DIAGNOSIS — Z5189 Encounter for other specified aftercare: Secondary | ICD-10-CM | POA: Diagnosis not present

## 2011-12-14 DIAGNOSIS — M545 Low back pain: Secondary | ICD-10-CM | POA: Diagnosis not present

## 2011-12-14 DIAGNOSIS — Z96619 Presence of unspecified artificial shoulder joint: Secondary | ICD-10-CM | POA: Diagnosis not present

## 2011-12-14 DIAGNOSIS — M1710 Unilateral primary osteoarthritis, unspecified knee: Secondary | ICD-10-CM | POA: Diagnosis not present

## 2011-12-15 DIAGNOSIS — Z471 Aftercare following joint replacement surgery: Secondary | ICD-10-CM | POA: Diagnosis not present

## 2011-12-15 DIAGNOSIS — M545 Low back pain: Secondary | ICD-10-CM | POA: Diagnosis not present

## 2011-12-15 DIAGNOSIS — Z5189 Encounter for other specified aftercare: Secondary | ICD-10-CM | POA: Diagnosis not present

## 2011-12-15 DIAGNOSIS — M1710 Unilateral primary osteoarthritis, unspecified knee: Secondary | ICD-10-CM | POA: Diagnosis not present

## 2011-12-15 DIAGNOSIS — Z96619 Presence of unspecified artificial shoulder joint: Secondary | ICD-10-CM | POA: Diagnosis not present

## 2011-12-20 DIAGNOSIS — M545 Low back pain: Secondary | ICD-10-CM | POA: Diagnosis not present

## 2011-12-20 DIAGNOSIS — Z96619 Presence of unspecified artificial shoulder joint: Secondary | ICD-10-CM | POA: Diagnosis not present

## 2011-12-20 DIAGNOSIS — Z5189 Encounter for other specified aftercare: Secondary | ICD-10-CM | POA: Diagnosis not present

## 2011-12-20 DIAGNOSIS — Z471 Aftercare following joint replacement surgery: Secondary | ICD-10-CM | POA: Diagnosis not present

## 2011-12-20 DIAGNOSIS — M1710 Unilateral primary osteoarthritis, unspecified knee: Secondary | ICD-10-CM | POA: Diagnosis not present

## 2011-12-22 ENCOUNTER — Other Ambulatory Visit: Payer: Self-pay | Admitting: Family Medicine

## 2011-12-23 DIAGNOSIS — M545 Low back pain: Secondary | ICD-10-CM | POA: Diagnosis not present

## 2011-12-23 DIAGNOSIS — Z5189 Encounter for other specified aftercare: Secondary | ICD-10-CM | POA: Diagnosis not present

## 2011-12-23 DIAGNOSIS — Z96619 Presence of unspecified artificial shoulder joint: Secondary | ICD-10-CM | POA: Diagnosis not present

## 2011-12-23 DIAGNOSIS — M1710 Unilateral primary osteoarthritis, unspecified knee: Secondary | ICD-10-CM | POA: Diagnosis not present

## 2011-12-23 DIAGNOSIS — Z471 Aftercare following joint replacement surgery: Secondary | ICD-10-CM | POA: Diagnosis not present

## 2011-12-24 DIAGNOSIS — Z471 Aftercare following joint replacement surgery: Secondary | ICD-10-CM | POA: Diagnosis not present

## 2011-12-24 DIAGNOSIS — Z5189 Encounter for other specified aftercare: Secondary | ICD-10-CM | POA: Diagnosis not present

## 2011-12-24 DIAGNOSIS — Z96619 Presence of unspecified artificial shoulder joint: Secondary | ICD-10-CM | POA: Diagnosis not present

## 2011-12-24 DIAGNOSIS — M545 Low back pain: Secondary | ICD-10-CM | POA: Diagnosis not present

## 2011-12-24 DIAGNOSIS — M1710 Unilateral primary osteoarthritis, unspecified knee: Secondary | ICD-10-CM | POA: Diagnosis not present

## 2011-12-28 DIAGNOSIS — M1710 Unilateral primary osteoarthritis, unspecified knee: Secondary | ICD-10-CM | POA: Diagnosis not present

## 2011-12-28 DIAGNOSIS — Z471 Aftercare following joint replacement surgery: Secondary | ICD-10-CM | POA: Diagnosis not present

## 2011-12-28 DIAGNOSIS — M545 Low back pain: Secondary | ICD-10-CM | POA: Diagnosis not present

## 2011-12-28 DIAGNOSIS — Z96619 Presence of unspecified artificial shoulder joint: Secondary | ICD-10-CM | POA: Diagnosis not present

## 2011-12-28 DIAGNOSIS — Z5189 Encounter for other specified aftercare: Secondary | ICD-10-CM | POA: Diagnosis not present

## 2011-12-29 DIAGNOSIS — Z471 Aftercare following joint replacement surgery: Secondary | ICD-10-CM | POA: Diagnosis not present

## 2011-12-29 DIAGNOSIS — M545 Low back pain: Secondary | ICD-10-CM | POA: Diagnosis not present

## 2011-12-29 DIAGNOSIS — Z5189 Encounter for other specified aftercare: Secondary | ICD-10-CM | POA: Diagnosis not present

## 2011-12-29 DIAGNOSIS — M1710 Unilateral primary osteoarthritis, unspecified knee: Secondary | ICD-10-CM | POA: Diagnosis not present

## 2011-12-29 DIAGNOSIS — Z96619 Presence of unspecified artificial shoulder joint: Secondary | ICD-10-CM | POA: Diagnosis not present

## 2011-12-30 DIAGNOSIS — M545 Low back pain: Secondary | ICD-10-CM | POA: Diagnosis not present

## 2011-12-30 DIAGNOSIS — Z96619 Presence of unspecified artificial shoulder joint: Secondary | ICD-10-CM | POA: Diagnosis not present

## 2011-12-30 DIAGNOSIS — Z5189 Encounter for other specified aftercare: Secondary | ICD-10-CM | POA: Diagnosis not present

## 2011-12-30 DIAGNOSIS — Z471 Aftercare following joint replacement surgery: Secondary | ICD-10-CM | POA: Diagnosis not present

## 2011-12-30 DIAGNOSIS — M1710 Unilateral primary osteoarthritis, unspecified knee: Secondary | ICD-10-CM | POA: Diagnosis not present

## 2012-01-04 DIAGNOSIS — Z96619 Presence of unspecified artificial shoulder joint: Secondary | ICD-10-CM | POA: Diagnosis not present

## 2012-01-04 DIAGNOSIS — M545 Low back pain: Secondary | ICD-10-CM | POA: Diagnosis not present

## 2012-01-04 DIAGNOSIS — Z5189 Encounter for other specified aftercare: Secondary | ICD-10-CM | POA: Diagnosis not present

## 2012-01-04 DIAGNOSIS — M1710 Unilateral primary osteoarthritis, unspecified knee: Secondary | ICD-10-CM | POA: Diagnosis not present

## 2012-01-04 DIAGNOSIS — Z471 Aftercare following joint replacement surgery: Secondary | ICD-10-CM | POA: Diagnosis not present

## 2012-01-06 DIAGNOSIS — M1710 Unilateral primary osteoarthritis, unspecified knee: Secondary | ICD-10-CM | POA: Diagnosis not present

## 2012-01-06 DIAGNOSIS — M545 Low back pain: Secondary | ICD-10-CM | POA: Diagnosis not present

## 2012-01-06 DIAGNOSIS — Z471 Aftercare following joint replacement surgery: Secondary | ICD-10-CM | POA: Diagnosis not present

## 2012-01-06 DIAGNOSIS — Z96619 Presence of unspecified artificial shoulder joint: Secondary | ICD-10-CM | POA: Diagnosis not present

## 2012-01-06 DIAGNOSIS — Z5189 Encounter for other specified aftercare: Secondary | ICD-10-CM | POA: Diagnosis not present

## 2012-01-08 DIAGNOSIS — Z96619 Presence of unspecified artificial shoulder joint: Secondary | ICD-10-CM | POA: Diagnosis not present

## 2012-01-08 DIAGNOSIS — Z471 Aftercare following joint replacement surgery: Secondary | ICD-10-CM | POA: Diagnosis not present

## 2012-01-08 DIAGNOSIS — M545 Low back pain: Secondary | ICD-10-CM | POA: Diagnosis not present

## 2012-01-08 DIAGNOSIS — M1710 Unilateral primary osteoarthritis, unspecified knee: Secondary | ICD-10-CM | POA: Diagnosis not present

## 2012-01-08 DIAGNOSIS — Z5189 Encounter for other specified aftercare: Secondary | ICD-10-CM | POA: Diagnosis not present

## 2012-01-10 DIAGNOSIS — Z96619 Presence of unspecified artificial shoulder joint: Secondary | ICD-10-CM | POA: Diagnosis not present

## 2012-01-10 DIAGNOSIS — Z471 Aftercare following joint replacement surgery: Secondary | ICD-10-CM | POA: Diagnosis not present

## 2012-01-10 DIAGNOSIS — Z5189 Encounter for other specified aftercare: Secondary | ICD-10-CM | POA: Diagnosis not present

## 2012-01-10 DIAGNOSIS — M545 Low back pain: Secondary | ICD-10-CM | POA: Diagnosis not present

## 2012-01-10 DIAGNOSIS — M1710 Unilateral primary osteoarthritis, unspecified knee: Secondary | ICD-10-CM | POA: Diagnosis not present

## 2012-01-12 DIAGNOSIS — Z471 Aftercare following joint replacement surgery: Secondary | ICD-10-CM | POA: Diagnosis not present

## 2012-01-12 DIAGNOSIS — M545 Low back pain: Secondary | ICD-10-CM | POA: Diagnosis not present

## 2012-01-12 DIAGNOSIS — M1710 Unilateral primary osteoarthritis, unspecified knee: Secondary | ICD-10-CM | POA: Diagnosis not present

## 2012-01-12 DIAGNOSIS — Z96619 Presence of unspecified artificial shoulder joint: Secondary | ICD-10-CM | POA: Diagnosis not present

## 2012-01-12 DIAGNOSIS — Z5189 Encounter for other specified aftercare: Secondary | ICD-10-CM | POA: Diagnosis not present

## 2012-01-17 DIAGNOSIS — M545 Low back pain: Secondary | ICD-10-CM | POA: Diagnosis not present

## 2012-01-17 DIAGNOSIS — M1710 Unilateral primary osteoarthritis, unspecified knee: Secondary | ICD-10-CM | POA: Diagnosis not present

## 2012-01-17 DIAGNOSIS — Z96619 Presence of unspecified artificial shoulder joint: Secondary | ICD-10-CM | POA: Diagnosis not present

## 2012-01-17 DIAGNOSIS — Z471 Aftercare following joint replacement surgery: Secondary | ICD-10-CM | POA: Diagnosis not present

## 2012-01-17 DIAGNOSIS — Z5189 Encounter for other specified aftercare: Secondary | ICD-10-CM | POA: Diagnosis not present

## 2012-01-19 DIAGNOSIS — Z96619 Presence of unspecified artificial shoulder joint: Secondary | ICD-10-CM | POA: Diagnosis not present

## 2012-01-19 DIAGNOSIS — Z5189 Encounter for other specified aftercare: Secondary | ICD-10-CM | POA: Diagnosis not present

## 2012-01-19 DIAGNOSIS — M1710 Unilateral primary osteoarthritis, unspecified knee: Secondary | ICD-10-CM | POA: Diagnosis not present

## 2012-01-19 DIAGNOSIS — Z471 Aftercare following joint replacement surgery: Secondary | ICD-10-CM | POA: Diagnosis not present

## 2012-01-19 DIAGNOSIS — M545 Low back pain: Secondary | ICD-10-CM | POA: Diagnosis not present

## 2012-01-26 DIAGNOSIS — M545 Low back pain: Secondary | ICD-10-CM | POA: Diagnosis not present

## 2012-01-26 DIAGNOSIS — Z5189 Encounter for other specified aftercare: Secondary | ICD-10-CM | POA: Diagnosis not present

## 2012-01-26 DIAGNOSIS — Z96619 Presence of unspecified artificial shoulder joint: Secondary | ICD-10-CM | POA: Diagnosis not present

## 2012-01-26 DIAGNOSIS — Z471 Aftercare following joint replacement surgery: Secondary | ICD-10-CM | POA: Diagnosis not present

## 2012-01-26 DIAGNOSIS — M1710 Unilateral primary osteoarthritis, unspecified knee: Secondary | ICD-10-CM | POA: Diagnosis not present

## 2012-02-08 ENCOUNTER — Other Ambulatory Visit: Payer: Self-pay | Admitting: Family Medicine

## 2012-02-23 ENCOUNTER — Encounter: Payer: Self-pay | Admitting: Family Medicine

## 2012-02-23 ENCOUNTER — Ambulatory Visit (INDEPENDENT_AMBULATORY_CARE_PROVIDER_SITE_OTHER): Payer: Medicare Other | Admitting: Family Medicine

## 2012-02-23 VITALS — BP 150/90 | Temp 98.8°F | Wt 151.0 lb

## 2012-02-23 DIAGNOSIS — K219 Gastro-esophageal reflux disease without esophagitis: Secondary | ICD-10-CM

## 2012-02-23 DIAGNOSIS — J209 Acute bronchitis, unspecified: Secondary | ICD-10-CM | POA: Diagnosis not present

## 2012-02-23 MED ORDER — HYDROCODONE-HOMATROPINE 5-1.5 MG/5ML PO SYRP: 5.0000 mL | ORAL_SOLUTION | Freq: Four times a day (QID) | ORAL | Status: AC | PRN

## 2012-02-23 MED ORDER — GABAPENTIN 300 MG PO CAPS: 300.0000 mg | ORAL_CAPSULE | Freq: Every day | ORAL | Status: DC

## 2012-02-23 MED ORDER — ESOMEPRAZOLE MAGNESIUM 40 MG PO CPDR: 40.0000 mg | DELAYED_RELEASE_CAPSULE | Freq: Every day | ORAL | Status: DC

## 2012-02-23 MED ORDER — TRAMADOL HCL 50 MG PO TABS: 50.0000 mg | ORAL_TABLET | Freq: Four times a day (QID) | ORAL | Status: DC | PRN

## 2012-02-23 NOTE — Patient Instructions (Addendum)

## 2012-02-23 NOTE — Progress Notes (Signed)
  Subjective:    Patient ID: [name], female    DOB: 06-05-1927, 79 y.o.   MRN: [id number]  HPI Patient seen with acute illness 6-7 days history of cough and nasal congestion. No body aches. No fever. Cough especially at night. Used over-the-counter Robitussin Cough syrup without much improvement. No dyspnea. Nonsmoker. No nausea or vomiting. Denies sore throat.  Past Medical History  Diagnosis Date  . COLONIC POLYPS 02/20/2010  . GOITER 02/20/2010  . HYPERLIPIDEMIA 02/20/2010  . GERD 02/20/2010  . OSTEOARTHRITIS, MULTIPLE JOINTS 02/20/2010  . BACK PAIN, LUMBAR, CHRONIC 02/20/2010  . URINARY INCONTINENCE 02/20/2010  . DIVERTICULITIS, HX OF 02/20/2010  . Cardiomegaly 03/16/2010   Past Surgical History  Procedure Date  . Abdominal hysterectomy   . Tonsillectomy 1936  . Spine surgery     4 back surgeries  . Thyroidectomy, partial     ? goiter, no CA  . Nose surgery 1975    head surgery to repair cracked bone over nose   . Total shoulder arthroplasty 07/30/2011    Procedure: TOTAL SHOULDER ARTHROPLASTY;  Surgeon: Verlee Rossetti, MD;  Location: [facility] OR;  Service: Orthopedics;  Laterality: Right;  Right Reversed Total Shoulder    reports that she has never smoked. She does not have any smokeless tobacco history on file. She reports that she does not drink alcohol or use illicit drugs. family history includes Cancer in her mother; Diabetes in her mother; and Hyperlipidemia in her mother. No Known Allergies    Review of Systems As per history of present illness    Objective:   Physical Exam  Constitutional: She appears well-developed and well-nourished.  HENT:  Right Ear: External ear normal.  Left Ear: External ear normal.  Mouth/Throat: Oropharynx is clear and moist.  Cardiovascular: Normal rate and regular rhythm.   Pulmonary/Chest: Effort normal and breath sounds normal. No respiratory distress. She has no wheezes. She has no rales.          Assessment & Plan:  Acute  bronchitis. Suspect viral. Hycodan cough syrup as needed for nighttime use. Followup promptly for fever or worsening symptoms

## 2012-02-28 ENCOUNTER — Other Ambulatory Visit: Payer: Self-pay | Admitting: Family Medicine

## 2012-05-23 ENCOUNTER — Encounter: Payer: Self-pay | Admitting: Gastroenterology

## 2012-05-25 ENCOUNTER — Ambulatory Visit (INDEPENDENT_AMBULATORY_CARE_PROVIDER_SITE_OTHER): Payer: Medicare Other | Admitting: Family Medicine

## 2012-05-25 ENCOUNTER — Encounter: Payer: Self-pay | Admitting: Family Medicine

## 2012-05-25 VITALS — BP 130/90 | Temp 98.9°F

## 2012-05-25 DIAGNOSIS — B372 Candidiasis of skin and nail: Secondary | ICD-10-CM

## 2012-05-25 MED ORDER — TRAMADOL HCL 50 MG PO TABS: 50.0000 mg | ORAL_TABLET | Freq: Four times a day (QID) | ORAL | Status: DC | PRN

## 2012-05-25 MED ORDER — NYSTATIN 100000 UNIT/GM EX CREA: TOPICAL_CREAM | Freq: Two times a day (BID) | CUTANEOUS | Status: DC

## 2012-05-25 NOTE — Patient Instructions (Addendum)
Candida Infection, Adult  A candida infection (also called yeast, fungus and Monilia infection) is an overgrowth of yeast that can occur anywhere on the body. A yeast infection commonly occurs in warm, moist body areas. Usually, the infection remains localized but can spread to become a systemic infection. A yeast infection may be a sign of a more severe disease such as diabetes, leukemia, or AIDS.  A yeast infection can occur in both men and women. In women, Candida vaginitis is a vaginal infection. It is one of the most common causes of vaginitis. Men usually do not have symptoms or know they have an infection until other problems develop. Men may find out they have a yeast infection because their sex partner has a yeast infection. Uncircumcised men are more likely to get a yeast infection than circumcised men. This is because the uncircumcised glans is not exposed to air and does not remain as dry as that of a circumcised glans. Older adults may develop yeast infections around dentures.  CAUSES   Women   Antibiotics.   Steroid medication taken for a long time.   Being overweight (obese).   Diabetes.   Poor immune condition.   Certain serious medical conditions.   Immune suppressive medications for organ transplant patients.   Chemotherapy.   Pregnancy.   Menstration.   Stress and fatigue.   Intravenous drug use.   Oral contraceptives.   Wearing tight-fitting clothes in the crotch area.   Catching it from a sex partner who has a yeast infection.   Spermicide.   Intravenous, urinary, or other catheters.  Men   Catching it from a sex partner who has a yeast infection.   Having oral or anal sex with a person who has the infection.   Spermicide.   Diabetes.   Antibiotics.   Poor immune system.   Medications that suppress the immune system.   Intravenous drug use.   Intravenous, urinary, or other catheters.  SYMPTOMS   Women   Thick, white vaginal discharge.   Vaginal itching.   Redness and  swelling in and around the vagina.   Irritation of the lips of the vagina and perineum.   Blisters on the vaginal lips and perineum.   Painful sexual intercourse.   Low blood sugar (hypoglycemia).   Painful urination.   Bladder infections.   Intestinal problems such as constipation, indigestion, bad breath, bloating, increase in gas, diarrhea, or loose stools.  Men   Men may develop intestinal problems such as constipation, indigestion, bad breath, bloating, increase in gas, diarrhea, or loose stools.   Dry, cracked skin on the penis with itching or discomfort.   Jock itch.   Dry, flaky skin.   Athlete's foot.   Hypoglycemia.  DIAGNOSIS   Women   A history and an exam are performed.   The discharge may be examined under a microscope.   A culture may be taken of the discharge.  Men   A history and an exam are performed.   Any discharge from the penis or areas of cracked skin will be looked at under the microscope and cultured.   Stool samples may be cultured.  TREATMENT   Women   Vaginal antifungal suppositories and creams.   Medicated creams to decrease irritation and itching on the outside of the vagina.   Warm compresses to the perineal area to decrease swelling and discomfort.   Oral antifungal medications.   Medicated vaginal suppositories or cream for repeated or recurrent infections.     Wash and dry the irritation areas before applying the cream.   Eating yogurt with lactobacillus may help with prevention and treatment.   Sometimes painting the vagina with gentian violet solution may help if creams and suppositories do not work.  Men   Antifungal creams and oral antifungal medications.   Sometimes treatment must continue for 30 days after the symptoms go away to prevent recurrence.  HOME CARE INSTRUCTIONS   Women   Use cotton underwear and avoid tight-fitting clothing.   Avoid colored, scented toilet paper and deodorant tampons or pads.   Do not douche.   Keep your diabetes  under control.   Finish all the prescribed medications.   Keep your skin clean and dry.   Consume milk or yogurt with lactobacillus active culture regularly. If you get frequent yeast infections and think that is what the infection is, there are over-the-counter medications that you can get. If the infection does not show healing in 3 days, talk to your caregiver.   Tell your sex partner you have a yeast infection. Your partner may need treatment also, especially if your infection does not clear up or recurs.  Men   Keep your skin clean and dry.   Keep your diabetes under control.   Finish all prescribed medications.   Tell your sex partner that you have a yeast infection so they can be treated if necessary.  SEEK MEDICAL CARE IF:    Your symptoms do not clear up or worsen in one week after treatment.   You have an oral temperature above 102 F (38.9 C).   You have trouble swallowing or eating for a prolonged time.   You develop blisters on and around your vagina.   You develop vaginal bleeding and it is not your menstrual period.   You develop abdominal pain.   You develop intestinal problems as mentioned above.   You get weak or lightheaded.   You have painful or increased urination.   You have pain during sexual intercourse.  MAKE SURE YOU:    Understand these instructions.   Will watch your condition.   Will get help right away if you are not doing well or get worse.  Document Released: 03/11/2004 Document Revised: 04/26/2011 Document Reviewed: 06/23/2009  ExitCare Patient Information 2013 [employer].

## 2012-05-25 NOTE — Progress Notes (Signed)
  Subjective:    Patient ID: [name], female    DOB: 1927-06-13, 79 y.o.   MRN: [id number]  HPI Patient seen with redness and irritation under both breasts. Present for a least a couple of weeks. Trying to leave bra off recently.  She has tried zinc oxide and soap with aloe as well as bacitracin ointment without improvement Denies any fever or chills. No history of diabetes. No recent antibiotics. No recent prednisone use.  Past Medical History  Diagnosis Date  . COLONIC POLYPS 02/20/2010  . GOITER 02/20/2010  . HYPERLIPIDEMIA 02/20/2010  . GERD 02/20/2010  . OSTEOARTHRITIS, MULTIPLE JOINTS 02/20/2010  . BACK PAIN, LUMBAR, CHRONIC 02/20/2010  . URINARY INCONTINENCE 02/20/2010  . DIVERTICULITIS, HX OF 02/20/2010  . Cardiomegaly 03/16/2010   Past Surgical History  Procedure Laterality Date  . Abdominal hysterectomy    . Tonsillectomy  1936  . Spine surgery      4 back surgeries  . Thyroidectomy, partial      ? goiter, no CA  . Nose surgery  1975    head surgery to repair cracked bone over nose   . Total shoulder arthroplasty  07/30/2011    Procedure: TOTAL SHOULDER ARTHROPLASTY;  Surgeon: Verlee Rossetti, MD;  Location: [facility] OR;  Service: Orthopedics;  Laterality: Right;  Right Reversed Total Shoulder    reports that she has never smoked. She does not have any smokeless tobacco history on file. She reports that she does not drink alcohol or use illicit drugs. family history includes Cancer in her mother; Diabetes in her mother; and Hyperlipidemia in her mother. No Known Allergies    Review of Systems  Constitutional: Negative for fever and chills.  Skin: Positive for rash.       Objective:   Physical Exam  Constitutional: She appears well-developed and well-nourished.  Cardiovascular: Normal rate and regular rhythm.   Pulmonary/Chest: Effort normal and breath sounds normal. No respiratory distress. She has no wheezes. She has no rales.  Skin: Rash noted.  Patient has  macular erythematous rash under both breasts. Well-demarcated border. No pustules.          Assessment & Plan:  Candidate skin infection. Nystatin cream twice daily. Keep areas dry as possible. Consider medication if not improving the next week or 2. Avoid tightfitting clothing

## 2012-06-08 ENCOUNTER — Telehealth: Payer: Self-pay | Admitting: Family Medicine

## 2012-06-08 NOTE — Telephone Encounter (Signed)
I spoke with pt and she reports a 25% improvement.  She estimates she [place] using about 1 tube of the cream a week.  Wahts refills to go to [facility].  Pt also asked if there was an oral med she could also take for her nails?

## 2012-06-08 NOTE — Telephone Encounter (Signed)
May refill Nystatin cream [facility].  I suggest fluconazole 100 mg once daily for 7 days for her candida rash under breasts if only 25% better. Treatment of fungal nail infections requires medication such as oral Lamisil and this could only be prescribed in pt with normal recent liver panel. Schedule follow up if she is interested.

## 2012-06-08 NOTE — Telephone Encounter (Signed)
Pt states she was supposed to call in and inform the MD how she is doing in this past 2 week period.  Fungus is somewhat better, but not much. Pt has one refill left from drug store, but would prefer [facility].  Pt would like [name] to call when she has time.

## 2012-06-09 MED ORDER — FLUCONAZOLE 100 MG PO TABS: 100.0000 mg | ORAL_TABLET | Freq: Every day | ORAL | Status: DC

## 2012-06-09 MED ORDER — NYSTATIN 100000 UNIT/GM EX CREA: TOPICAL_CREAM | Freq: Two times a day (BID) | CUTANEOUS | Status: DC

## 2012-06-09 NOTE — Telephone Encounter (Signed)
Pt informed, and voiced her understanding.

## 2012-06-29 ENCOUNTER — Other Ambulatory Visit: Payer: Self-pay | Admitting: Family Medicine

## 2012-07-04 ENCOUNTER — Telehealth: Payer: Self-pay | Admitting: Family Medicine

## 2012-07-04 NOTE — Telephone Encounter (Signed)
Pt was here 2-3 wks ago. Given rx for nystatin cream (MYCOSTATIN) and fluconazole (DIFLUCAN) 100 MG tablet Pt states that when she was on BOTH the pills & the cream, it worked well. However, pills are now gone, and cream by itself is not working to get rid of it. Pt requesting more pills. Please advise and call her back. Thank you.

## 2012-07-04 NOTE — Telephone Encounter (Signed)
Refill Fluconazole 100 mg daily for  Another 7 days.

## 2012-07-05 MED ORDER — FLUCONAZOLE 100 MG PO TABS: 100.0000 mg | ORAL_TABLET | Freq: Every day | ORAL | Status: DC

## 2012-07-05 NOTE — Telephone Encounter (Signed)
Pt informed

## 2012-07-11 ENCOUNTER — Telehealth: Payer: Self-pay | Admitting: Family Medicine

## 2012-07-11 NOTE — Telephone Encounter (Signed)
Pt said she was seen 4/10 for place under breast and in groin area.  Pt is not any better. Pt would like to come back to see md tomorrow.  Only "same day" appts. Pls advise.

## 2012-07-11 NOTE — Telephone Encounter (Signed)
OK to give same day appointment.

## 2012-07-13 ENCOUNTER — Ambulatory Visit (INDEPENDENT_AMBULATORY_CARE_PROVIDER_SITE_OTHER): Payer: Medicare Other | Admitting: Family Medicine

## 2012-07-13 ENCOUNTER — Encounter: Payer: Self-pay | Admitting: Family Medicine

## 2012-07-13 VITALS — BP 130/80 | Temp 98.6°F | Wt 154.0 lb

## 2012-07-13 DIAGNOSIS — B372 Candidiasis of skin and nail: Secondary | ICD-10-CM | POA: Diagnosis not present

## 2012-07-13 DIAGNOSIS — R739 Hyperglycemia, unspecified: Secondary | ICD-10-CM

## 2012-07-13 DIAGNOSIS — R7309 Other abnormal glucose: Secondary | ICD-10-CM | POA: Diagnosis not present

## 2012-07-13 LAB — GLUCOSE, POCT (MANUAL RESULT ENTRY): POC Glucose: 102 mg/dl — AB (ref 70–99)

## 2012-07-13 MED ORDER — FLUCONAZOLE 100 MG PO TABS: ORAL_TABLET | ORAL | Status: DC

## 2012-07-13 MED ORDER — NAFTIFINE HCL 1 % EX CREA: TOPICAL_CREAM | Freq: Every day | CUTANEOUS | Status: DC

## 2012-07-13 NOTE — Patient Instructions (Addendum)
Candida Infection, Adult A candida infection (also called yeast, fungus and Monilia infection) is an overgrowth of yeast that can occur anywhere on the body. A yeast infection commonly occurs in warm, moist body areas. Usually, the infection remains localized but can spread to become a systemic infection. A yeast infection may be a sign of a more severe disease such as diabetes, leukemia, or AIDS. A yeast infection can occur in both men and women. In women, Candida vaginitis is a vaginal infection. It is one of the most common causes of vaginitis. Men usually do not have symptoms or know they have an infection until other problems develop. Men may find out they have a yeast infection because their sex partner has a yeast infection. Uncircumcised men are more likely to get a yeast infection than circumcised men. This is because the uncircumcised glans is not exposed to air and does not remain as dry as that of a circumcised glans. Older adults may develop yeast infections around dentures. CAUSES  Women  Antibiotics.  Steroid medication taken for a long time.  Being overweight (obese).  Diabetes.  Poor immune condition.  Certain serious medical conditions.  Immune suppressive medications for organ transplant patients.  Chemotherapy.  Pregnancy.  Menstration.  Stress and fatigue.  Intravenous drug use.  Oral contraceptives.  Wearing tight-fitting clothes in the crotch area.  Catching it from a sex partner who has a yeast infection.  Spermicide.  Intravenous, urinary, or other catheters. Men  Catching it from a sex partner who has a yeast infection.  Having oral or anal sex with a person who has the infection.  Spermicide.  Diabetes.  Antibiotics.  Poor immune system.  Medications that suppress the immune system.  Intravenous drug use.  Intravenous, urinary, or other catheters. SYMPTOMS  Women  Thick, white vaginal discharge.  Vaginal itching.  Redness and  swelling in and around the vagina.  Irritation of the lips of the vagina and perineum.  Blisters on the vaginal lips and perineum.  Painful sexual intercourse.  Low blood sugar (hypoglycemia).  Painful urination.  Bladder infections.  Intestinal problems such as constipation, indigestion, bad breath, bloating, increase in gas, diarrhea, or loose stools. Men  Men may develop intestinal problems such as constipation, indigestion, bad breath, bloating, increase in gas, diarrhea, or loose stools.  Dry, cracked skin on the penis with itching or discomfort.  Jock itch.  Dry, flaky skin.  Athlete's foot.  Hypoglycemia. DIAGNOSIS  Women  A history and an exam are performed.  The discharge may be examined under a microscope.  A culture may be taken of the discharge. Men  A history and an exam are performed.  Any discharge from the penis or areas of cracked skin will be looked at under the microscope and cultured.  Stool samples may be cultured. TREATMENT  Women  Vaginal antifungal suppositories and creams.  Medicated creams to decrease irritation and itching on the outside of the vagina.  Warm compresses to the perineal area to decrease swelling and discomfort.  Oral antifungal medications.  Medicated vaginal suppositories or cream for repeated or recurrent infections.  Wash and dry the irritation areas before applying the cream.  Eating yogurt with lactobacillus may help with prevention and treatment.  Sometimes painting the vagina with gentian violet solution may help if creams and suppositories do not work. Men  Antifungal creams and oral antifungal medications.  Sometimes treatment must continue for 30 days after the symptoms go away to prevent recurrence. HOME CARE   INSTRUCTIONS  Women  Use cotton underwear and avoid tight-fitting clothing.  Avoid colored, scented toilet paper and deodorant tampons or pads.  Do not douche.  Keep your diabetes  under control.  Finish all the prescribed medications.  Keep your skin clean and dry.  Consume milk or yogurt with lactobacillus active culture regularly. If you get frequent yeast infections and think that is what the infection is, there are over-the-counter medications that you can get. If the infection does not show healing in 3 days, talk to your caregiver.  Tell your sex partner you have a yeast infection. Your partner may need treatment also, especially if your infection does not clear up or recurs. Men  Keep your skin clean and dry.  Keep your diabetes under control.  Finish all prescribed medications.  Tell your sex partner that you have a yeast infection so they can be treated if necessary. SEEK MEDICAL CARE IF:   Your symptoms do not clear up or worsen in one week after treatment.  You have an oral temperature above 102 F (38.9 C).  You have trouble swallowing or eating for a prolonged time.  You develop blisters on and around your vagina.  You develop vaginal bleeding and it is not your menstrual period.  You develop abdominal pain.  You develop intestinal problems as mentioned above.  You get weak or lightheaded.  You have painful or increased urination.  You have pain during sexual intercourse. MAKE SURE YOU:   Understand these instructions.  Will watch your condition.  Will get help right away if you are not doing well or get worse. Document Released: 03/11/2004 Document Revised: 04/26/2011 Document Reviewed: 06/23/2009 ExitCare Patient Information 2014 [employer].  

## 2012-07-13 NOTE — Progress Notes (Signed)
  Subjective:    Patient ID: [name], female    DOB: February 04, 1928, 79 y.o.   MRN: [id number]  HPI Persistent pruritic rash under both breasts and right groin region No recent antibiotics or prednisone use. No history of diabetes Patient was treated with nystatin cream and oral fluconazole initially noticed some improvement Now worsening over the past several days. She's trying to avoid occlusive clothing. She's not tried anything else topically. Aggravated by heat  Past Medical History  Diagnosis Date  . COLONIC POLYPS 02/20/2010  . GOITER 02/20/2010  . HYPERLIPIDEMIA 02/20/2010  . GERD 02/20/2010  . OSTEOARTHRITIS, MULTIPLE JOINTS 02/20/2010  . BACK PAIN, LUMBAR, CHRONIC 02/20/2010  . URINARY INCONTINENCE 02/20/2010  . DIVERTICULITIS, HX OF 02/20/2010  . Cardiomegaly 03/16/2010   Past Surgical History  Procedure Laterality Date  . Abdominal hysterectomy    . Tonsillectomy  1936  . Spine surgery      4 back surgeries  . Thyroidectomy, partial      ? goiter, no CA  . Nose surgery  1975    head surgery to repair cracked bone over nose   . Total shoulder arthroplasty  07/30/2011    Procedure: TOTAL SHOULDER ARTHROPLASTY;  Surgeon: Verlee Rossetti, MD;  Location: [facility] OR;  Service: Orthopedics;  Laterality: Right;  Right Reversed Total Shoulder    reports that she has never smoked. She does not have any smokeless tobacco history on file. She reports that she does not drink alcohol or use illicit drugs. family history includes Cancer in her mother; Diabetes in her mother; and Hyperlipidemia in her mother. No Known Allergies    Review of Systems  Constitutional: Negative for fever and chills.  Skin: Positive for rash.       Objective:   Physical Exam  Constitutional: She appears well-developed and well-nourished.  Cardiovascular: Normal rate and regular rhythm.   Pulmonary/Chest: Effort normal and breath sounds normal. No respiratory distress. She has no wheezes. She has no  rales.  Skin: Rash noted.  Patient has significant inflamed erythematous well-demarcated rash under both breasts and right groin region          Assessment & Plan:  Candida skin infection. Check blood sugar. She does not have other recent exacerbating factors such as recent antibiotics or prednisone use. Change to Naftin (fungicidal) cream twice daily. Fluconazole 100 mg every other day until followup and reassess 2 weeks. Pt knows to keep areas dry as possible.Marland Kitchen

## 2012-07-27 DIAGNOSIS — M19019 Primary osteoarthritis, unspecified shoulder: Secondary | ICD-10-CM | POA: Diagnosis not present

## 2012-07-28 ENCOUNTER — Encounter: Payer: Self-pay | Admitting: Family Medicine

## 2012-07-28 ENCOUNTER — Ambulatory Visit (INDEPENDENT_AMBULATORY_CARE_PROVIDER_SITE_OTHER): Payer: Medicare Other | Admitting: Family Medicine

## 2012-07-28 VITALS — BP 142/82 | Temp 98.4°F | Wt 154.0 lb

## 2012-07-28 DIAGNOSIS — B372 Candidiasis of skin and nail: Secondary | ICD-10-CM | POA: Diagnosis not present

## 2012-07-28 MED ORDER — NAFTIFINE HCL 1 % EX CREA: TOPICAL_CREAM | Freq: Every day | CUTANEOUS | Status: DC

## 2012-07-28 NOTE — Patient Instructions (Addendum)
Leave off vaseline and neosporin.

## 2012-07-28 NOTE — Progress Notes (Signed)
  Subjective:    Patient ID: [name], female    DOB: [birth year], 79 y.o.   MRN: [id number]  HPI Followup Candida infection mostly under breast She did not respond to over-the-counter antifungals. We switched the Naftin last visit and also regimen of oral fluconazole 100 mg every other day and rash is finally improving but not resolved. Blood sugar last visit was normal. No recent antibiotics. No recent steroids Patient did try unfortunately Vaseline and Neosporin which seemed to exacerbate  Past Medical History  Diagnosis Date  . COLONIC POLYPS 02/20/2010  . GOITER 02/20/2010  . HYPERLIPIDEMIA 02/20/2010  . GERD 02/20/2010  . OSTEOARTHRITIS, MULTIPLE JOINTS 02/20/2010  . BACK PAIN, LUMBAR, CHRONIC 02/20/2010  . URINARY INCONTINENCE 02/20/2010  . DIVERTICULITIS, HX OF 02/20/2010  . Cardiomegaly 03/16/2010   Past Surgical History  Procedure Laterality Date  . Abdominal hysterectomy    . Tonsillectomy  1936  . Spine surgery      4 back surgeries  . Thyroidectomy, partial      ? goiter, no CA  . Nose surgery  1975    head surgery to repair cracked bone over nose   . Total shoulder arthroplasty  07/30/2011    Procedure: TOTAL SHOULDER ARTHROPLASTY;  Surgeon: Verlee Rossetti, MD;  Location: [facility] OR;  Service: Orthopedics;  Laterality: Right;  Right Reversed Total Shoulder    reports that she has never smoked. She does not have any smokeless tobacco history on file. She reports that she does not drink alcohol or use illicit drugs. family history includes Cancer in her mother; Diabetes in her mother; and Hyperlipidemia in her mother. No Known Allergies    Review of Systems  Constitutional: Negative for fever and chills.  Skin: Positive for rash.       Objective:   Physical Exam  Constitutional: She appears well-developed and well-nourished. No distress.  Cardiovascular: Normal rate and regular rhythm.   Pulmonary/Chest: Effort normal and breath sounds normal. No respiratory  distress. She has no wheezes. She has no rales.  Skin: Rash noted.  Patient has faint erythematous rash well demarcated border under both breast. This is much less erythematous compared to last visit          Assessment & Plan:  Yeast dermatitis. Keep area dry as possible. Continue Naftin. Continue another 2 week course of fluconazolle 100 milligrams every other day. Touch base 2 weeks if not fully resolving. Avoid Vaseline or Neosporin

## 2012-09-30 ENCOUNTER — Other Ambulatory Visit: Payer: Self-pay | Admitting: Family Medicine

## 2012-10-12 ENCOUNTER — Other Ambulatory Visit: Payer: Self-pay | Admitting: Family Medicine

## 2012-11-22 IMAGING — CR DG CHEST 2V
2 series · 2 of 2 positions shown · non-contrast
Comparison: June 24, 2010

CLINICAL DATA: Preoperative assessment for right shoulder
replacement

CHEST - 2 VIEW

[view not recorded (1 of 2)]
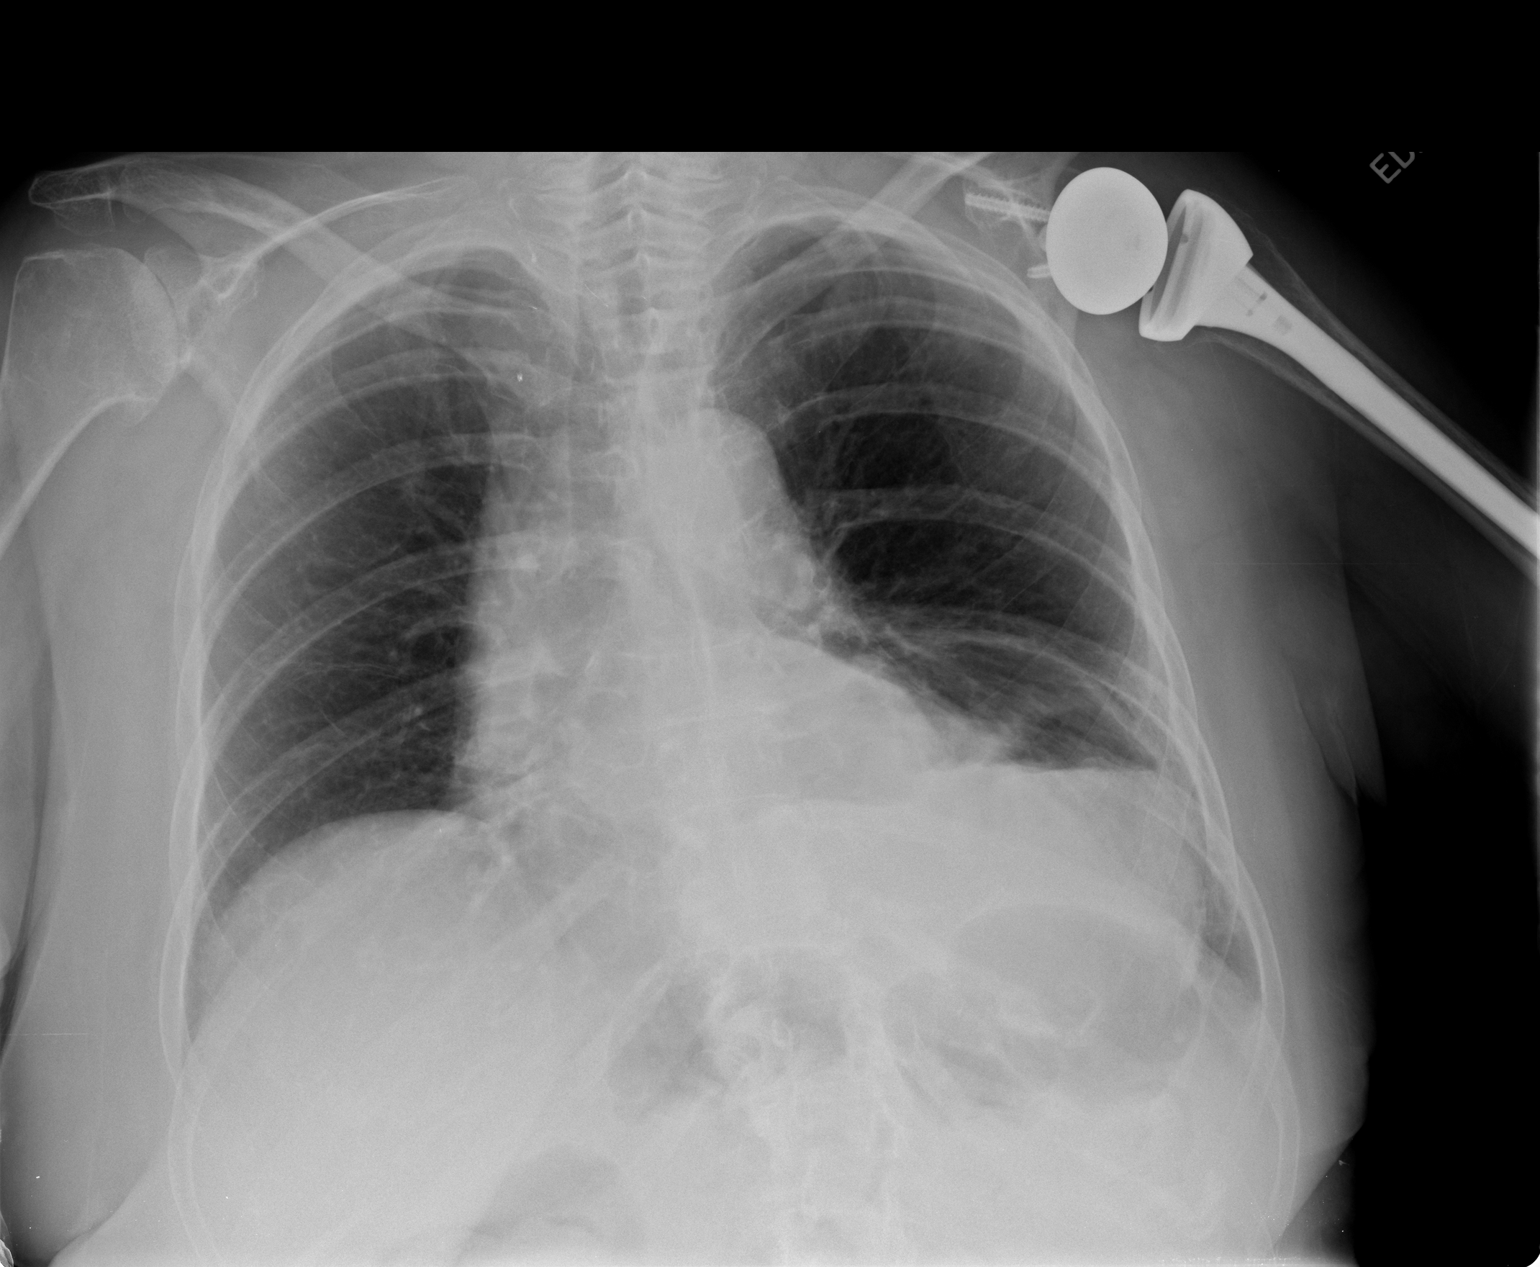

[view not recorded (2 of 2)]
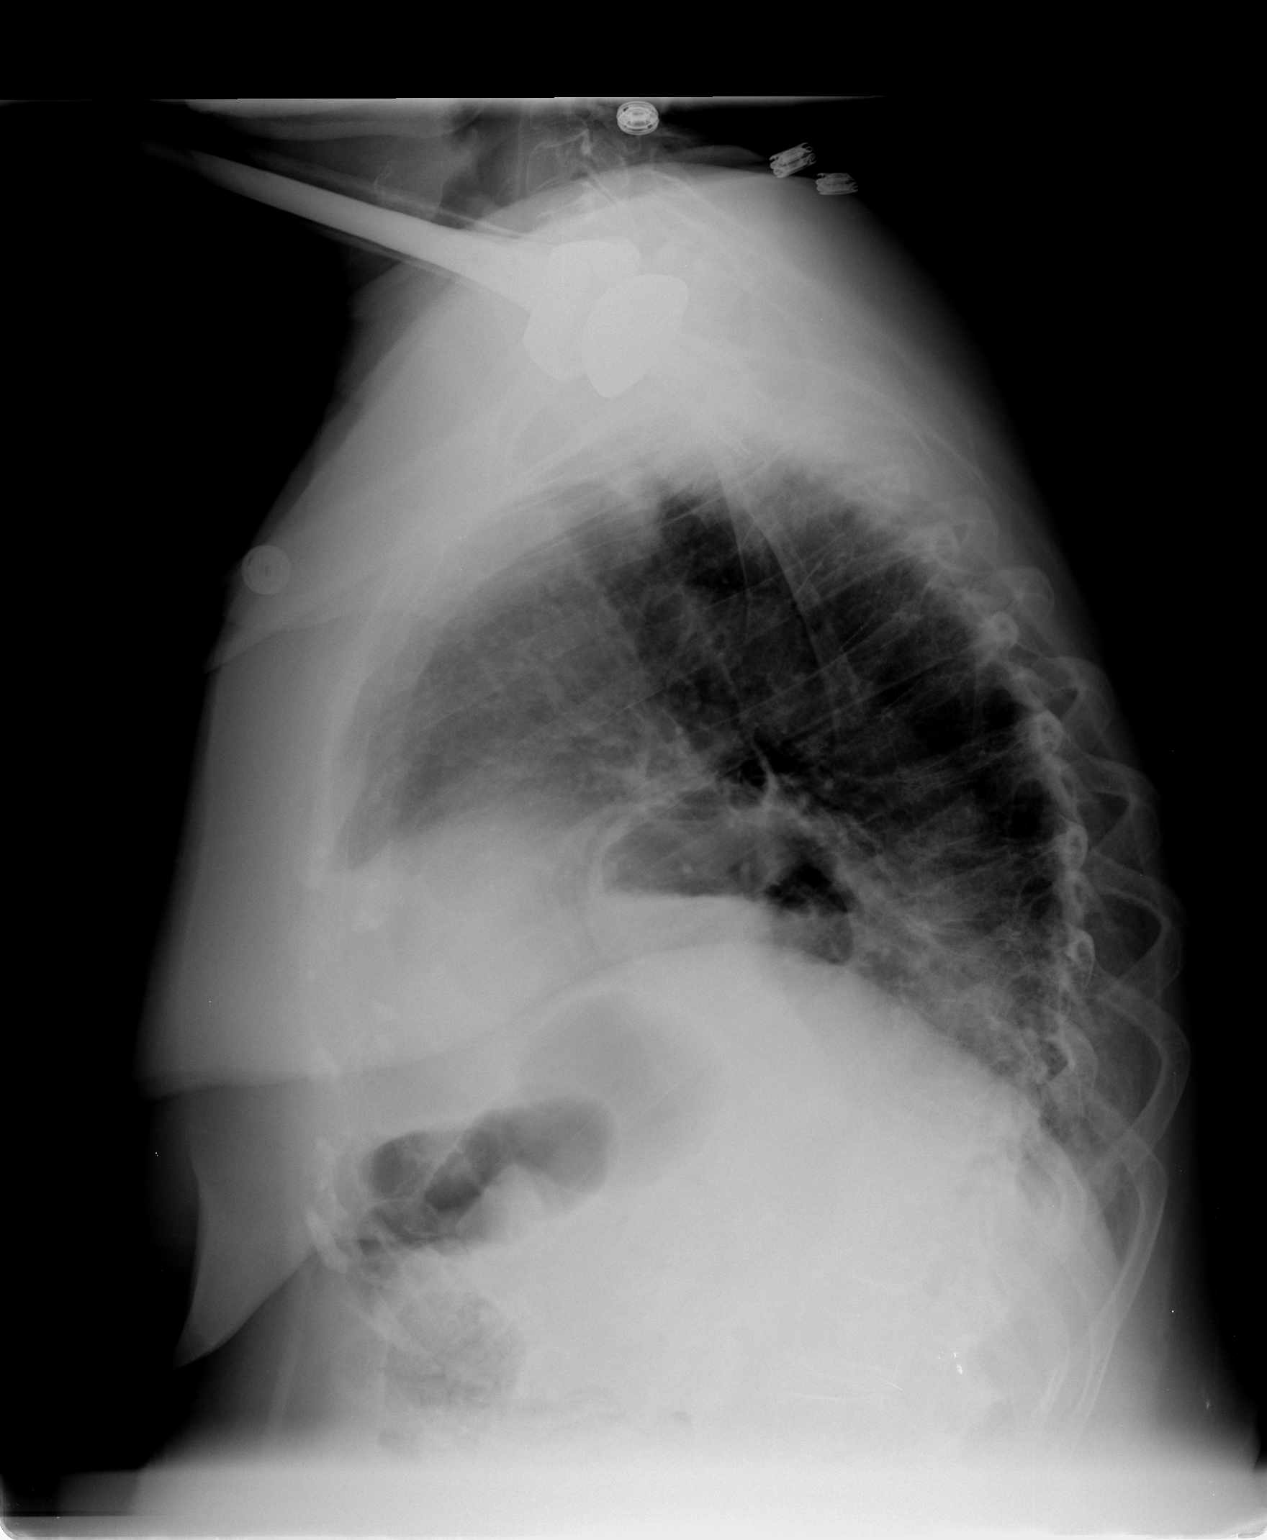

[2 of 2 positions shown; findings below may reference images not displayed]

FINDINGS: Borderline enlargement of cardiac silhouette.
Atherosclerotic calcification aorta.
Pulmonary vascularity normal.
Large hiatal hernia with prominent air fluid level.
Low lung volumes with bibasilar atelectasis.
Upper lungs clear.
No pleural effusion or pneumothorax.
Interval left shoulder joint replacement.
Advanced degenerative changes right glenohumeral joint.
Bones appear demineralized.
IMPRESSION: Large hiatal hernia.
Bibasilar atelectasis.

## 2012-11-26 IMAGING — CR DG SHOULDER 2+V PORT*R*
1 series · 1 of 1 positions shown · non-contrast
Comparison: Chest radiographs 07/26/2011.

CLINICAL DATA: Status post total shoulder arthroplasty.

PORTABLE RIGHT SHOULDER - 2+ VIEW

[ap ext rot]
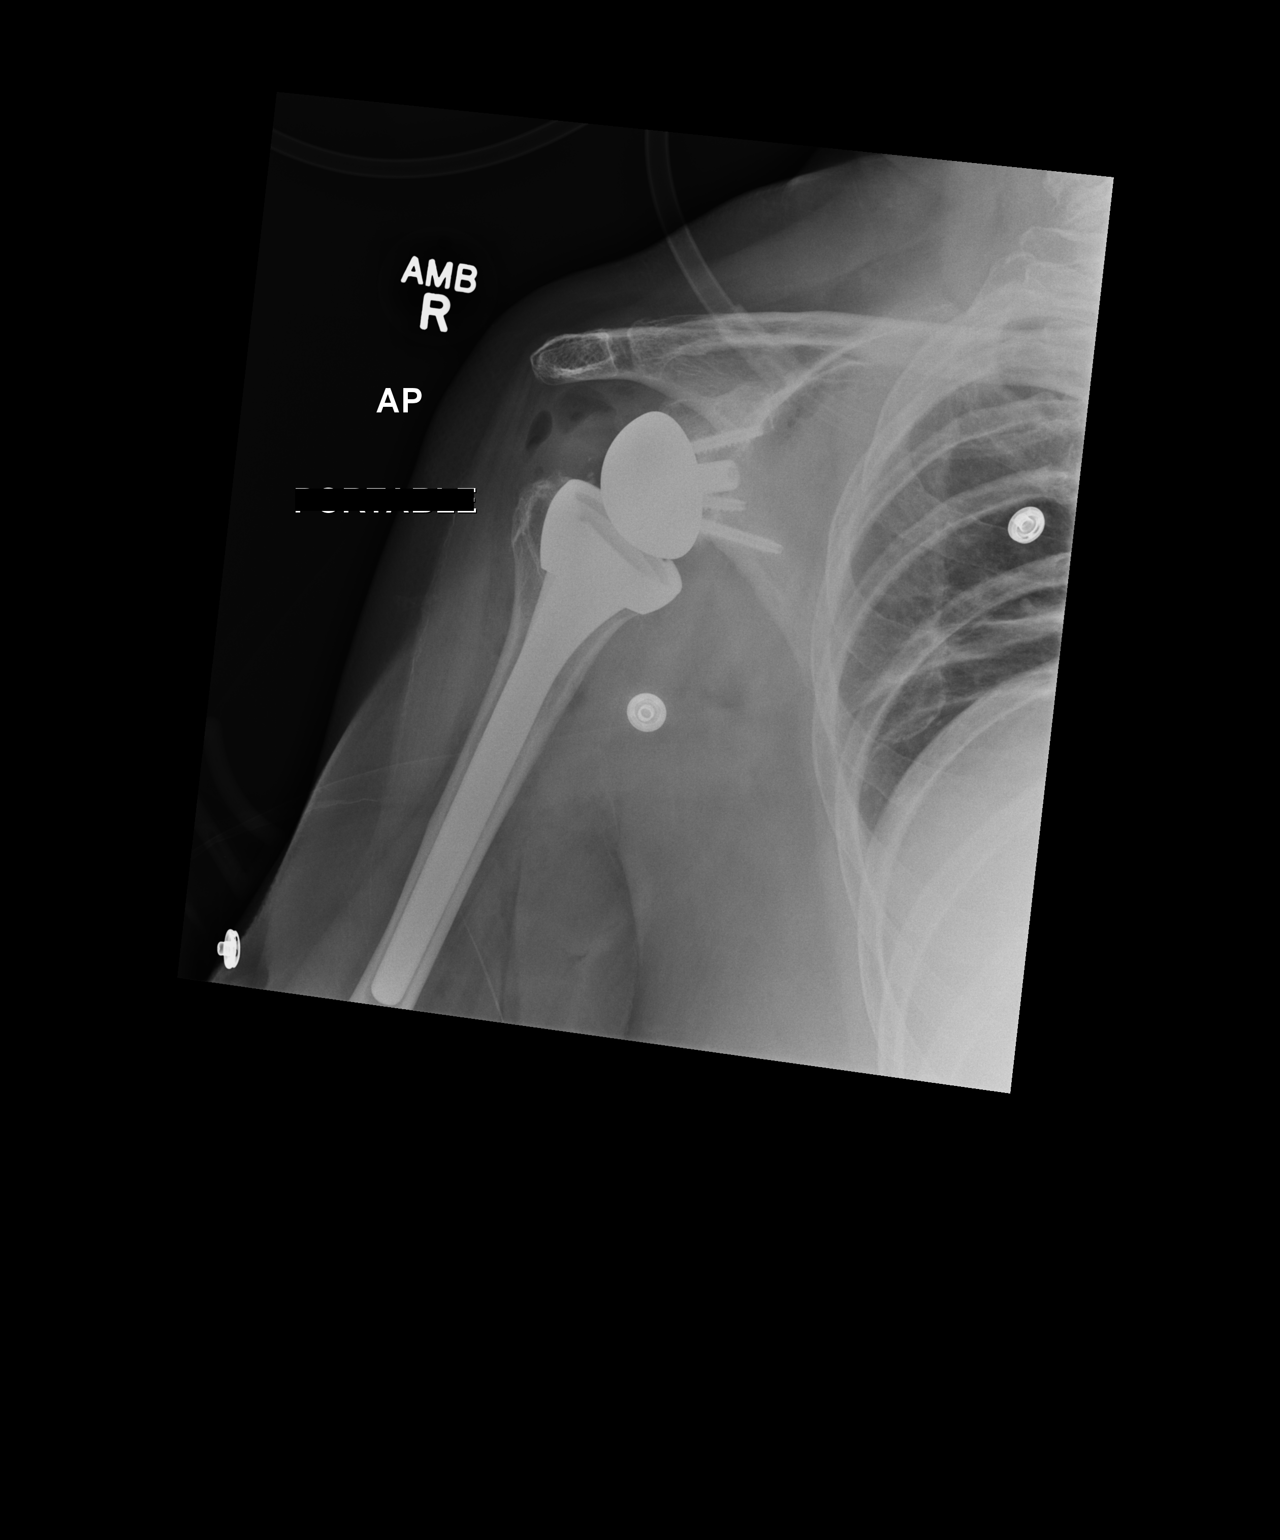

[1 of 1 positions shown; findings below may reference images not displayed]

FINDINGS: 5561 hours.  Patient is status post reverse arthroplasty
of the right shoulder.  Hardware appears well positioned.  There is
gas within the soft tissues surrounding the shoulder.  No fractures
are identified. There is right basilar atelectasis.
IMPRESSION: No demonstrated complication following right shoulder reverse
arthroplasty.

## 2013-01-01 ENCOUNTER — Other Ambulatory Visit: Payer: Self-pay | Admitting: Family Medicine

## 2013-02-17 ENCOUNTER — Other Ambulatory Visit: Payer: Self-pay | Admitting: Family Medicine

## 2013-03-05 ENCOUNTER — Other Ambulatory Visit: Payer: Self-pay | Admitting: Family Medicine

## 2013-04-11 ENCOUNTER — Telehealth: Payer: Self-pay

## 2013-04-11 NOTE — Telephone Encounter (Signed)
done

## 2013-04-11 NOTE — Telephone Encounter (Signed)
Can you please call patient to schedule a office visit for DMV forms.

## 2013-04-17 ENCOUNTER — Encounter: Payer: Self-pay | Admitting: *Deleted

## 2013-04-18 ENCOUNTER — Encounter: Payer: Self-pay | Admitting: Family Medicine

## 2013-04-18 ENCOUNTER — Ambulatory Visit (INDEPENDENT_AMBULATORY_CARE_PROVIDER_SITE_OTHER): Payer: Medicare Other | Admitting: Family Medicine

## 2013-04-18 VITALS — BP 134/84 | HR 88 | Temp 97.7°F

## 2013-04-18 DIAGNOSIS — M159 Polyosteoarthritis, unspecified: Secondary | ICD-10-CM

## 2013-04-18 DIAGNOSIS — M545 Low back pain, unspecified: Secondary | ICD-10-CM

## 2013-04-18 DIAGNOSIS — R29898 Other symptoms and signs involving the musculoskeletal system: Secondary | ICD-10-CM

## 2013-04-18 NOTE — Progress Notes (Signed)
Subjective:    Patient ID: [name], female    DOB: 05-01-1927, 80 y.o.   MRN: [id number]  HPI Patient is here for Department of Transportation forms completed. Her chronic medical problems include history of obesity, hyperlipidemia, GERD, osteoarthritis involving multiple joints, urge urinary incontinence, and chronic left lower extremity weakness following back surgery many years ago.  Patient relates that back in 1975 she had a closed head injury with cerebrospinal fluid leak. She developed subsequent meningitis. She had mostly full recovery but had minimal residual left upper extremity weakness. She's had 4 previous lumbar back surgeries and following surgery 1989 she reports that she had left footdrop and left lower extremity weakness which has persisted. Hx of bilateral shoulder arthroplasties.  She does not have any history of seizure. No history of emotional instability or mental illness. She wears glasses for correction. No cardiovascular disorder. No endocrine disorder. No respiratory disorder. No history of substance abuse. No history of syncope or altered consciousness.  Past Medical History  Diagnosis Date  . COLONIC POLYPS 02/20/2010  . GOITER 02/20/2010  . HYPERLIPIDEMIA 02/20/2010  . GERD 02/20/2010  . OSTEOARTHRITIS, MULTIPLE JOINTS 02/20/2010  . BACK PAIN, LUMBAR, CHRONIC 02/20/2010  . URINARY INCONTINENCE 02/20/2010  . DIVERTICULITIS, HX OF 02/20/2010  . Cardiomegaly 03/16/2010   Past Surgical History  Procedure Laterality Date  . Abdominal hysterectomy    . Tonsillectomy  1936  . Thyroidectomy, partial      ? goiter, no CA  . Nose surgery  1975    head surgery to repair cracked bone over nose   . Total shoulder arthroplasty  07/30/2011    Procedure: TOTAL SHOULDER ARTHROPLASTY;  Surgeon: Augustin Schooling, MD;  Location: [place];  Service: Orthopedics;  Laterality: Right;  Right Reversed Total Shoulder  . Spine surgery      4 back surgeries    reports that she has  never smoked. She does not have any smokeless tobacco history on file. She reports that she does not drink alcohol or use illicit drugs. family history includes Cancer in her mother; Diabetes in her mother; Hyperlipidemia in her mother. No Known Allergies    Review of Systems  Constitutional: Positive for fatigue. Negative for appetite change and unexpected weight change.  Respiratory: Negative for cough and shortness of breath.   Cardiovascular: Negative for chest pain, palpitations and leg swelling.  Gastrointestinal: Negative for nausea, vomiting and abdominal pain.  Endocrine: Negative for polydipsia and polyuria.  Neurological: Negative for dizziness, syncope and headaches.  Psychiatric/Behavioral: Negative for confusion. The patient is not nervous/anxious.        Objective:   Physical Exam  Constitutional: She is oriented to person, place, and time. She appears well-developed and well-nourished. No distress.  Cardiovascular: Normal rate and regular rhythm.   Pulmonary/Chest: Effort normal and breath sounds normal. No respiratory distress. She has no wheezes. She has no rales.  Musculoskeletal: She exhibits no edema.  Neurological: She is alert and oriented to person, place, and time. No cranial nerve deficit.  Patient in general has slow response times. She has weakness left lower extremity with left footdrop and also noted to have some weakness with left knee extension and slight weakness with left hip flexion. Upper extremity strength is good with handgrip and biceps and triceps function. She has weakness of abduction with previous shoulder arthroplasties bilaterally.  Able to abduct shoulders to around 120 degrees bilateral.  Psychiatric: She has a normal mood and affect. Her behavior is  normal. Judgment and thought content normal.  Normal 3 word recall and serial 7's subtraction.  Struggled with date but not day of week or year.  Judgement intact.          Assessment &  Plan:  Patient has multiple chronic problems above who presents with chronic left lower extremity weakness. She has had bilateral shoulder arthroplasties but has fairly good range of motion and fairly well preserved upper extremity strength. She seems to have slow reaction time but fair coordination. We are concerned regarding her weakness and slow reaction times and for driving safety and will recommend road test evaluation.  DMV forms completed.  Over 30 minutes were spent evaluating and completing forms.

## 2013-04-18 NOTE — Progress Notes (Signed)
Pre visit review using our clinic review tool, if applicable. No additional management support is needed unless otherwise documented below in the visit note. 

## 2013-04-19 DIAGNOSIS — R29898 Other symptoms and signs involving the musculoskeletal system: Secondary | ICD-10-CM | POA: Insufficient documentation

## 2013-04-26 ENCOUNTER — Telehealth: Payer: Self-pay | Admitting: Family Medicine

## 2013-04-26 NOTE — Telephone Encounter (Signed)
Pt states the dmv papers had the incorrect date that pt last seen. Pt has a new set of papers she will bring by

## 2013-05-25 NOTE — Telephone Encounter (Signed)
Spoke with patient husband. Informed that husband that Dr. Elease Hashimoto did not complete the second form that was brought in, because the patient had already wrote on the forms.

## 2013-05-25 NOTE — Telephone Encounter (Signed)
Daughter called to inform us the [facility] is asking for pt's driver's license.  They never received the paperwork she brought back in. Daughter will fax letter from [facility]. Daughter would like [place] to call dmv and ask what we need to do to make this right so that pt does not lose her drivers license.

## 2013-07-13 ENCOUNTER — Other Ambulatory Visit: Payer: Self-pay | Admitting: Family Medicine

## 2013-07-27 ENCOUNTER — Other Ambulatory Visit: Payer: Self-pay | Admitting: Family Medicine

## 2013-08-07 DIAGNOSIS — Z96619 Presence of unspecified artificial shoulder joint: Secondary | ICD-10-CM | POA: Diagnosis not present

## 2013-08-07 DIAGNOSIS — M1710 Unilateral primary osteoarthritis, unspecified knee: Secondary | ICD-10-CM | POA: Diagnosis not present

## 2013-08-13 ENCOUNTER — Other Ambulatory Visit: Payer: Self-pay | Admitting: Orthopedic Surgery

## 2013-08-13 DIAGNOSIS — M25511 Pain in right shoulder: Secondary | ICD-10-CM

## 2013-08-16 ENCOUNTER — Ambulatory Visit
Admission: RE | Admit: 2013-08-16 | Discharge: 2013-08-16 | Disposition: A | Discharge status: Home / Self Care | Payer: Medicare Other | Source: Ambulatory Visit | Attending: Orthopedic Surgery | Admitting: Orthopedic Surgery

## 2013-08-16 DIAGNOSIS — Z96619 Presence of unspecified artificial shoulder joint: Secondary | ICD-10-CM | POA: Diagnosis not present

## 2013-08-16 DIAGNOSIS — S43026A Posterior dislocation of unspecified humerus, initial encounter: Secondary | ICD-10-CM | POA: Diagnosis not present

## 2013-08-16 DIAGNOSIS — M25511 Pain in right shoulder: Secondary | ICD-10-CM

## 2013-08-22 ENCOUNTER — Other Ambulatory Visit: Payer: Self-pay | Admitting: Family Medicine

## 2013-10-23 ENCOUNTER — Telehealth: Payer: Self-pay | Admitting: Family Medicine

## 2013-10-23 MED ORDER — CYCLOBENZAPRINE HCL 10 MG PO TABS: ORAL_TABLET | ORAL | Status: DC

## 2013-10-23 NOTE — Telephone Encounter (Signed)
Pt's daughter Martha([name]) calling on behalf of pt to request refill of cyclobenzaprine (FLEXERIL) 10 MG tablet.  Pt has run out of her medicaiton as of Sunday.  Please send temporary [facility] to [facility] [place].  Pt has appt on Monday and will have yearly rx send to [facility].

## 2013-10-23 NOTE — Telephone Encounter (Signed)
Rx sent to pharmacy   

## 2013-10-29 ENCOUNTER — Encounter: Payer: Self-pay | Admitting: Family Medicine

## 2013-10-29 ENCOUNTER — Ambulatory Visit (INDEPENDENT_AMBULATORY_CARE_PROVIDER_SITE_OTHER): Payer: Medicare Other | Admitting: Family Medicine

## 2013-10-29 VITALS — BP 140/80 | HR 64 | Temp 97.7°F | Wt 159.0 lb

## 2013-10-29 DIAGNOSIS — R600 Localized edema: Secondary | ICD-10-CM

## 2013-10-29 DIAGNOSIS — R609 Edema, unspecified: Secondary | ICD-10-CM

## 2013-10-29 DIAGNOSIS — Z23 Encounter for immunization: Secondary | ICD-10-CM

## 2013-10-29 DIAGNOSIS — Z79899 Other long term (current) drug therapy: Secondary | ICD-10-CM | POA: Diagnosis not present

## 2013-10-29 DIAGNOSIS — M159 Polyosteoarthritis, unspecified: Secondary | ICD-10-CM | POA: Diagnosis not present

## 2013-10-29 DIAGNOSIS — K219 Gastro-esophageal reflux disease without esophagitis: Secondary | ICD-10-CM

## 2013-10-29 DIAGNOSIS — Z5181 Encounter for therapeutic drug level monitoring: Secondary | ICD-10-CM

## 2013-10-29 DIAGNOSIS — E785 Hyperlipidemia, unspecified: Secondary | ICD-10-CM

## 2013-10-29 LAB — BASIC METABOLIC PANEL
BUN: 13 mg/dL (ref 6–23)
CO2: 23 mEq/L (ref 19–32)
CREATININE: 0.7 mg/dL (ref 0.4–1.2)
Calcium: 8.8 mg/dL (ref 8.4–10.5)
Chloride: 103 mEq/L (ref 96–112)
GFR: 90.68 mL/min (ref >60.00)
GLUCOSE: 82 mg/dL (ref 70–99)
Potassium: 4.1 mEq/L (ref 3.5–5.1)
Sodium: 136 mEq/L (ref 135–145)

## 2013-10-29 LAB — LIPID PANEL
CHOLESTEROL: 236 mg/dL — AB (ref 0–200)
HDL: 38.4 mg/dL — ABNORMAL LOW (ref >39.00)
LDL CALC: 159 mg/dL — AB (ref 0–99)
NonHDL: 197.6
Total CHOL/HDL Ratio: 6
Triglycerides: 192 mg/dL — ABNORMAL HIGH (ref 0.0–149.0)
VLDL: 38.4 mg/dL (ref 0.0–40.0)

## 2013-10-29 LAB — HEPATIC FUNCTION PANEL
ALT: 15 U/L (ref 0–35)
AST: 20 U/L (ref 0–37)
Albumin: 3.5 g/dL (ref 3.5–5.2)
Alkaline Phosphatase: 68 U/L (ref 39–117)
BILIRUBIN DIRECT: 0 mg/dL (ref 0.0–0.3)
Total Bilirubin: 0.4 mg/dL (ref 0.2–1.2)
Total Protein: 6.5 g/dL (ref 6.0–8.3)

## 2013-10-29 MED ORDER — HYDROCHLOROTHIAZIDE 25 MG PO TABS: 25.0000 mg | ORAL_TABLET | ORAL | Status: DC | PRN

## 2013-10-29 MED ORDER — TRAMADOL HCL 50 MG PO TABS: 50.0000 mg | ORAL_TABLET | Freq: Four times a day (QID) | ORAL | Status: DC | PRN

## 2013-10-29 MED ORDER — CYCLOBENZAPRINE HCL 10 MG PO TABS: ORAL_TABLET | ORAL | Status: DC

## 2013-10-29 NOTE — Progress Notes (Signed)
Pre visit review using our clinic review tool, if applicable. No additional management support is needed unless otherwise documented below in the visit note. 

## 2013-10-29 NOTE — Progress Notes (Signed)
   Subjective:    Patient ID: [name], female    DOB: 07/10/1927, 80 y.o.   MRN: [id number]  HPI Medical followup. Patient has history of obesity, hyperlipidemia, GERD, osteoarthritis involving multiple joints. She's had bilateral shoulder replacements. She has urinary urgency which is controlled with VESIcare. Only occasional constipation issues. GERD well controlled with Nexium. Takes tramadol as needed for arthritis but not on regular basis. She's taken HCTZ in the past as needed for edema issues peripherally. No issues with hypertension. No CAD history. Minimal ambulation. No flu vaccine yet.  Past Medical History  Diagnosis Date  . COLONIC POLYPS 02/20/2010  . GOITER 02/20/2010  . HYPERLIPIDEMIA 02/20/2010  . GERD 02/20/2010  . OSTEOARTHRITIS, MULTIPLE JOINTS 02/20/2010  . BACK PAIN, LUMBAR, CHRONIC 02/20/2010  . URINARY INCONTINENCE 02/20/2010  . DIVERTICULITIS, HX OF 02/20/2010  . Cardiomegaly 03/16/2010   Past Surgical History  Procedure Laterality Date  . Abdominal hysterectomy    . Tonsillectomy  1936  . Thyroidectomy, partial      ? goiter, no CA  . Nose surgery  1975    head surgery to repair cracked bone over nose   . Total shoulder arthroplasty  07/30/2011    Procedure: TOTAL SHOULDER ARTHROPLASTY;  Surgeon: Augustin Schooling, MD;  Location: [place];  Service: Orthopedics;  Laterality: Right;  Right Reversed Total Shoulder  . Spine surgery      4 back surgeries    reports that she has never smoked. She does not have any smokeless tobacco history on file. She reports that she does not drink alcohol or use illicit drugs. family history includes Cancer in her mother; Diabetes in her mother; Hyperlipidemia in her mother. No Known Allergies    Review of Systems  Constitutional: Negative for appetite change, fatigue and unexpected weight change.  Eyes: Negative for visual disturbance.  Respiratory: Negative for cough, chest tightness, shortness of breath and wheezing.     Cardiovascular: Positive for leg swelling. Negative for chest pain and palpitations.  Gastrointestinal: Positive for constipation. Negative for abdominal pain.  Genitourinary: Negative for dysuria.  Neurological: Negative for dizziness, seizures, syncope, weakness, light-headedness and headaches.       Objective:   Physical Exam  Constitutional: She is oriented to person, place, and time. She appears well-developed and well-nourished.  Neck: Neck supple. No thyromegaly present.  Cardiovascular: Normal rate and regular rhythm.   Pulmonary/Chest: Effort normal and breath sounds normal. She has no wheezes.  She has bibasilar crackles.  Musculoskeletal:  Trace nonpitting edema legs bilaterally  Neurological: She is alert and oriented to person, place, and time.          Assessment & Plan:  #1 osteoarthritis involving multiple joints. Stable. Refill tramadol for as needed use #2 history of bilateral leg edema. Suspect largely venous stasis. Refill HCTZ which she uses very infrequently. Emphasized elevation and compression  #3 GERD which is well controlled  #4 urinary urgency stable on VESIcare  #5 history of hyperlipidemia. Patient requesting repeat lipid panel. #5 health maintenance. Flu vaccine given.

## 2013-12-04 ENCOUNTER — Other Ambulatory Visit: Payer: Self-pay | Admitting: Family Medicine

## 2013-12-10 ENCOUNTER — Telehealth: Payer: Self-pay | Admitting: Family Medicine

## 2013-12-10 NOTE — Telephone Encounter (Signed)
Tramadol Last visit 10/29/13 Last refill 10/29/13 #90 0 refill

## 2013-12-10 NOTE — Telephone Encounter (Signed)
Pt needs refills on tramadol 50  Mg and cyclobenzaprine 10 mg #90 each w/[facility]ills [facility]

## 2013-12-10 NOTE — Telephone Encounter (Signed)
Refill for 3 months. 

## 2013-12-11 MED ORDER — CYCLOBENZAPRINE HCL 10 MG PO TABS: ORAL_TABLET | ORAL | Status: DC

## 2013-12-11 MED ORDER — TRAMADOL HCL 50 MG PO TABS: 50.0000 mg | ORAL_TABLET | Freq: Four times a day (QID) | ORAL | Status: DC | PRN

## 2013-12-11 NOTE — Telephone Encounter (Signed)
Rx's sent to mail order.  

## 2014-03-17 ENCOUNTER — Other Ambulatory Visit: Payer: Self-pay | Admitting: Family Medicine

## 2014-06-26 ENCOUNTER — Other Ambulatory Visit: Payer: Self-pay

## 2014-06-26 MED ORDER — CYCLOBENZAPRINE HCL 10 MG PO TABS: ORAL_TABLET | ORAL | Status: DC

## 2014-06-26 MED ORDER — HYDROCHLOROTHIAZIDE 25 MG PO TABS: 25.0000 mg | ORAL_TABLET | ORAL | Status: DC | PRN

## 2014-06-26 MED ORDER — GABAPENTIN 300 MG PO CAPS: 300.0000 mg | ORAL_CAPSULE | Freq: Every day | ORAL | Status: DC

## 2014-08-01 ENCOUNTER — Other Ambulatory Visit: Payer: Self-pay | Admitting: Family Medicine

## 2014-08-23 ENCOUNTER — Ambulatory Visit (INDEPENDENT_AMBULATORY_CARE_PROVIDER_SITE_OTHER): Payer: Medicare Other | Admitting: Family Medicine

## 2014-08-23 ENCOUNTER — Encounter: Payer: Self-pay | Admitting: Family Medicine

## 2014-08-23 VITALS — BP 130/80 | HR 87 | Temp 98.6°F

## 2014-08-23 DIAGNOSIS — M150 Primary generalized (osteo)arthritis: Secondary | ICD-10-CM

## 2014-08-23 DIAGNOSIS — R29898 Other symptoms and signs involving the musculoskeletal system: Secondary | ICD-10-CM | POA: Diagnosis not present

## 2014-08-23 DIAGNOSIS — M545 Low back pain: Secondary | ICD-10-CM | POA: Diagnosis not present

## 2014-08-23 DIAGNOSIS — M159 Polyosteoarthritis, unspecified: Secondary | ICD-10-CM

## 2014-08-23 MED ORDER — GABAPENTIN 300 MG PO CAPS: 300.0000 mg | ORAL_CAPSULE | Freq: Every day | ORAL | Status: DC

## 2014-08-23 MED ORDER — TRAMADOL HCL 50 MG PO TABS: 50.0000 mg | ORAL_TABLET | Freq: Four times a day (QID) | ORAL | Status: DC | PRN

## 2014-08-23 NOTE — Progress Notes (Signed)
   Subjective:    Patient ID: [name], female    DOB: Apr 16, 1927, 81 y.o.   MRN: [id number]  HPI  Here for [facility] form completion. Chronic problems include history of GERD, goiter, hyperlipidemia, osteoarthritis, chronic low back pain, hyperlipidemia, chronic left lower extremity weakness following back surgery 1989. She also has remote history of head trauma but no history of seizures. She has been restricted in terms of distance driving, no night driving, and no interstate. She has full strength in her right lower extremity. No recent accidents. No history of substance abuse. No cognitive problems. She generally only drives to church once or twice per week and this is about 1-2 miles from her house.  Past Medical History  Diagnosis Date  . COLONIC POLYPS 02/20/2010  . GOITER 02/20/2010  . HYPERLIPIDEMIA 02/20/2010  . GERD 02/20/2010  . OSTEOARTHRITIS, MULTIPLE JOINTS 02/20/2010  . BACK PAIN, LUMBAR, CHRONIC 02/20/2010  . URINARY INCONTINENCE 02/20/2010  . DIVERTICULITIS, HX OF 02/20/2010  . Cardiomegaly 03/16/2010   Past Surgical History  Procedure Laterality Date  . Abdominal hysterectomy    . Tonsillectomy  1936  . Thyroidectomy, partial      ? goiter, no CA  . Nose surgery  1975    head surgery to repair cracked bone over nose   . Total shoulder arthroplasty  07/30/2011    Procedure: TOTAL SHOULDER ARTHROPLASTY;  Surgeon: Augustin Schooling, MD;  Location: [place];  Service: Orthopedics;  Laterality: Right;  Right Reversed Total Shoulder  . Spine surgery      4 back surgeries    reports that she has never smoked. She does not have any smokeless tobacco history on file. She reports that she does not drink alcohol or use illicit drugs. family history includes Cancer in her mother; Diabetes in her mother; Hyperlipidemia in her mother. No Known Allergies   Review of Systems  Constitutional: Negative for fatigue.  Eyes: Negative for visual disturbance.  Respiratory: Negative for cough,  chest tightness, shortness of breath and wheezing.   Cardiovascular: Negative for chest pain, palpitations and leg swelling.  Musculoskeletal: Positive for back pain and arthralgias.  Neurological: Negative for dizziness, seizures, syncope, weakness, light-headedness and headaches.       Objective:   Physical Exam  Constitutional: She is oriented to person, place, and time. She appears well-developed and well-nourished.  Cardiovascular: Normal rate and regular rhythm.   Pulmonary/Chest: Effort normal and breath sounds normal. No respiratory distress. She has no wheezes. She has no rales.  Neurological: She is alert and oriented to person, place, and time. No cranial nerve deficit.  She has full strength upper extremities. Had previous bilateral shoulder arthroplasties. She does have weakness left lower extremity with hip flexion, knee extension, and dorsiflexion compared to the right          Assessment & Plan:  #1 osteoarthritis involving multiple joints. Refill tramadol for as needed use. # 2 chronic low back pain. Refill gabapentin which has helped her for several years now #3 DMV form completion. She had assessment for occupational therapy and with driving test last year and was approved for driving. Will recommend continued restrictions of no interstates and no night driving

## 2014-08-23 NOTE — Progress Notes (Signed)
Pre visit review using our clinic review tool, if applicable. No additional management support is needed unless otherwise documented below in the visit note. 

## 2014-08-25 ENCOUNTER — Other Ambulatory Visit: Payer: Self-pay | Admitting: Family Medicine

## 2014-09-05 ENCOUNTER — Other Ambulatory Visit: Payer: Self-pay | Admitting: Family Medicine

## 2014-09-18 DIAGNOSIS — H16223 Keratoconjunctivitis sicca, not specified as Sjogren's, bilateral: Secondary | ICD-10-CM | POA: Diagnosis not present

## 2014-10-08 ENCOUNTER — Telehealth: Payer: Self-pay | Admitting: Family Medicine

## 2014-10-08 NOTE — Telephone Encounter (Signed)
Pt request refill traMADol (ULTRAM) 50 MG tablet cyclobenzaprine (FLEXERIL) 10 MG tablet 90 day [facility]

## 2014-10-09 MED ORDER — CYCLOBENZAPRINE HCL 10 MG PO TABS: ORAL_TABLET | ORAL | Status: DC

## 2014-10-09 NOTE — Telephone Encounter (Signed)
Rx sent to pharmacy   

## 2014-10-28 ENCOUNTER — Other Ambulatory Visit: Payer: Self-pay | Admitting: Family Medicine

## 2015-03-03 ENCOUNTER — Other Ambulatory Visit: Payer: Self-pay | Admitting: Family Medicine

## 2015-04-24 ENCOUNTER — Other Ambulatory Visit: Payer: Self-pay | Admitting: Family Medicine

## 2015-05-22 ENCOUNTER — Other Ambulatory Visit: Payer: Self-pay | Admitting: Family Medicine

## 2015-05-30 ENCOUNTER — Other Ambulatory Visit: Payer: Self-pay | Admitting: Family Medicine

## 2015-08-29 ENCOUNTER — Other Ambulatory Visit: Payer: Self-pay | Admitting: Family Medicine

## 2015-11-18 ENCOUNTER — Other Ambulatory Visit: Payer: Self-pay | Admitting: Family Medicine

## 2015-11-27 ENCOUNTER — Other Ambulatory Visit: Payer: Self-pay | Admitting: Family Medicine

## 2015-12-31 ENCOUNTER — Ambulatory Visit (INDEPENDENT_AMBULATORY_CARE_PROVIDER_SITE_OTHER): Payer: Medicare Other | Admitting: Family Medicine

## 2015-12-31 VITALS — BP 140/80 | HR 70 | Temp 97.6°F | Ht 60.0 in | Wt 145.0 lb

## 2015-12-31 DIAGNOSIS — K219 Gastro-esophageal reflux disease without esophagitis: Secondary | ICD-10-CM

## 2015-12-31 DIAGNOSIS — M150 Primary generalized (osteo)arthritis: Secondary | ICD-10-CM | POA: Diagnosis not present

## 2015-12-31 DIAGNOSIS — R252 Cramp and spasm: Secondary | ICD-10-CM

## 2015-12-31 DIAGNOSIS — Z23 Encounter for immunization: Secondary | ICD-10-CM

## 2015-12-31 DIAGNOSIS — N3941 Urge incontinence: Secondary | ICD-10-CM

## 2015-12-31 DIAGNOSIS — F5104 Psychophysiologic insomnia: Secondary | ICD-10-CM

## 2015-12-31 DIAGNOSIS — M159 Polyosteoarthritis, unspecified: Secondary | ICD-10-CM

## 2015-12-31 MED ORDER — TRAMADOL HCL 50 MG PO TABS: 50.0000 mg | ORAL_TABLET | Freq: Four times a day (QID) | ORAL | 0 refills | Status: DC | PRN

## 2015-12-31 NOTE — Progress Notes (Signed)
Subjective:     Patient ID: [name], female   DOB: 09/07/1927, 82 y.o.   MRN: [id number]  HPI Patient seen for medical follow-up and discuss several issues as below She has several chronic problems including history of GERD, history of diverticulitis, osteoarthritis of multiple joints, chronic low back pain, history of chronic urinary urgency, hyperlipidemia. She is very minimally ambulatory and has been so for several years. Her husband passed away last week from complications of inoperable aortic stenosis and severe coronary artery disease. She has very supportive family.  Daughter has specific questions about getting her a new bed. They are specifically interested in consideration of a "bariatric bed". She has some difficulties with repositioning. He's had some chronic insomnia issues. She currently is taking Tylenol PM up to 3 per night. She has not tried any melatonin. No ETOH use.  No late day use of caffeine.  Patient had been on gabapentin, Nexium, Ultram and basically took herself off all medications several months ago. She currently only takes Vesicare 5 mg once daily.  Also complains of frequent muscle cramps in both calves frequently at night.  Does not drink a lot of fluids.  No diuretic use.take multivitamin.  Past Medical History:  Diagnosis Date  . BACK PAIN, LUMBAR, CHRONIC 02/20/2010  . Cardiomegaly 03/16/2010  . COLONIC POLYPS 02/20/2010  . DIVERTICULITIS, HX OF 02/20/2010  . GERD 02/20/2010  . GOITER 02/20/2010  . HYPERLIPIDEMIA 02/20/2010  . OSTEOARTHRITIS, MULTIPLE JOINTS 02/20/2010  . URINARY INCONTINENCE 02/20/2010   Past Surgical History:  Procedure Laterality Date  . ABDOMINAL HYSTERECTOMY    . NOSE SURGERY  1975   head surgery to repair cracked bone over nose   . SPINE SURGERY     4 back surgeries  . THYROIDECTOMY, PARTIAL     ? goiter, no CA  . TONSILLECTOMY  1936  . TOTAL SHOULDER ARTHROPLASTY  07/30/2011   Procedure: TOTAL SHOULDER ARTHROPLASTY;  Surgeon:  Augustin Schooling, MD;  Location: [place];  Service: Orthopedics;  Laterality: Right;  Right Reversed Total Shoulder    reports that she has never smoked. She does not have any smokeless tobacco history on file. She reports that she does not drink alcohol or use drugs. family history includes Cancer in her mother; Diabetes in her mother; Hyperlipidemia in her mother. No Known Allergies   Review of Systems  Constitutional: Negative for appetite change, fatigue and unexpected weight change.  Eyes: Negative for visual disturbance.  Respiratory: Negative for cough, chest tightness, shortness of breath and wheezing.   Cardiovascular: Negative for chest pain, palpitations and leg swelling.  Endocrine: Negative for polydipsia and polyuria.  Genitourinary: Negative for dysuria.  Neurological: Negative for dizziness, seizures, syncope, weakness, light-headedness and headaches.  Psychiatric/Behavioral: Positive for sleep disturbance.       Objective:   Physical Exam  Constitutional: She is oriented to person, place, and time. She appears well-developed and well-nourished. No distress.  Neck: Neck supple. No thyromegaly present.  Cardiovascular: Normal rate and regular rhythm.   Pulmonary/Chest: Effort normal and breath sounds normal. No respiratory distress. She has no wheezes. She has no rales.  Musculoskeletal: She exhibits no edema.  Lymphadenopathy:    She has no cervical adenopathy.  Neurological: She is alert and oriented to person, place, and time. No cranial nerve deficit.  Psychiatric: She has a normal mood and affect. Her behavior is normal. Judgment and thought content normal.       Assessment:     #1  osteoarthritis involving multiple joints with limited mobility related her chronic back difficulties  #2 history of GERD currently stable off medication  #3 chronic insomnia  #4 history of hyperlipidemia  #5 frequent leg cramps    Plan:     -Schedule Medicare wellness  visit -Flu vaccine given -Daughter would like to do some research before looking at bed options. -Continue Vesicare as needed for urinary urgency. -We cautioned about potential adverse issues with Tylenol PM especially with current dosage of 3 per night. We have strongly advised reducing dose to may be one per night and try over-the-counter melatonin. If continues to have problems might consider trazodone. Avoid benzodiazepines  Eulas Post MD [facility]

## 2016-01-01 LAB — BASIC METABOLIC PANEL
BUN: 18 mg/dL (ref 6–23)
CHLORIDE: 100 meq/L (ref 96–112)
CO2: 25 meq/L (ref 19–32)
Calcium: 9.1 mg/dL (ref 8.4–10.5)
Creatinine, Ser: 0.59 mg/dL (ref 0.40–1.20)
GFR: 102.67 mL/min (ref >60.00)
Glucose, Bld: 86 mg/dL (ref 70–99)
Potassium: 4.3 mEq/L (ref 3.5–5.1)
SODIUM: 134 meq/L — AB (ref 135–145)

## 2016-01-01 LAB — MAGNESIUM: Magnesium: 2 mg/dL (ref 1.5–2.5)

## 2016-01-13 ENCOUNTER — Telehealth: Payer: Self-pay

## 2016-01-13 ENCOUNTER — Ambulatory Visit (INDEPENDENT_AMBULATORY_CARE_PROVIDER_SITE_OTHER): Payer: Medicare Other

## 2016-01-13 VITALS — BP 110/70 | HR 69 | Ht 60.0 in | Wt 146.0 lb

## 2016-01-13 DIAGNOSIS — Z0000 Encounter for general adult medical examination without abnormal findings: Secondary | ICD-10-CM

## 2016-01-13 NOTE — Progress Notes (Addendum)
Subjective:   [name] is a 82 y.o. female who presents for Medicare Annual (Subsequent) preventive examination.  The Patient was informed that the wellness visit is to identify future health risk and educate and initiate measures that can reduce risk for increased disease through the lifespan.    NO ROS; Medicare Wellness Visit  The dtr presents with the patient today and states she has  brothers; 2 live close; one lives in [place]  Sister died of brain aneurysm;  10 grands; 30  great [place] One son stays with her Monday night through Friday am There is a Caregiver every am and a Caregiver Friday, Sat and Sunday night Walk in shower, just remodeled  NEED hospital bed with trapeze   Prefers fully electric; will check on medicare benefit; does have tricare as well Discussed trapeze for benefit in turning and assisting to get oob. Has a w/c at home but uses walker; needed the assistance of 2 to get out of w/c today to weigh. States she does not have good sensation in left leg due to back surgeries in 89; she also has some foot drop in left foot but this was not noted today but may place her at great risk for falls. She has no exercise program;  To complicate this; spouse expired x 2 weeks ago.   Preventive Screening -Counseling & Management   Current smoking/ tobacco status/ never smoked  ETOH: no   RISK FACTORS Regular exercise; encourage the patient to get up and walk q 2 hours; No prolonged sitting; also encourage her to get up and down from chair has exercise; one fall this year; states she "slide" down to floor;   Diet: premier protein drinks at lunch Breakfast; eggs and liver pudding today but does eat breakfast  Supper: MOW saved from lunch.   Fall risk  C/o of pain in lower left leg;  Incident where she fell 46's had to learn to walk and talk  Had back surgery in 89; and sensation has not been as good One fall in the last year; 4 to 5  months; "slide down' By herself 12 noon until 6pm at hs;  Fairly stationary; watches soap show in the afternoon;  Has life alert and this hangs on her shirt Uses a walker at home; carpeted floor vinyl in bathroom;  Has a lift chair; can pull herself up   Sometimes leakage of urine; wears depends Medicare does not cover and she does not have Medicaid  Sometimes she is constipated   She has been inc'ing in disabled since 10 back surgeries;  Was with cane and now with walker; states she does not use her chairs at home  Still drives herself to church; uses walker to car;  a friend helps her to chair in the church; Helps her back in the car and a family member gets her from the car back to her home.     Cardiac Risk Factors:  Advanced aged > 9 in men; >65 in women Diabetes neg  Family History (mother had cancer; uterine;  Obesity BMI with in normal limits   Eye exam/ still reads.  Vision check close to one year ago per the patient and dtr  Eye Practice in [place]   Depression Screen/  Sometimes;  But manages well;  PhQ 2: negative  Activities of Daily Living - See functional screen   Cognitive testing; 6 cit was 0 Ad8 score; 0 or less than  2  MMSE deferred   Advanced Directives / in process;  Will update today since the passing of her spouse  Apt with attorney to do so today  List the name of Physicians or other Practitioners you currently use:   Immunization History  Administered Date(s) Administered  . Influenza Whole 02/20/2010, 10/27/2010  . Influenza, High Dose Seasonal PF 12/31/2015  . Influenza,inj,Quad PF,36+ Mos 10/29/2013  . Pneumococcal Polysaccharide-23 02/16/2008   Required Immunizations needed today  Screening test up to date or reviewed for plan of completion Health Maintenance Due  Topic Date Due  . PNA vac Low Risk Adult (2 of 2 - PCV13) 02/15/2009   Did bone density  with 3rd back surgery; it was good  Declines repeat  Zostavax declines  today  Prevnar 13, did not feel she needs this; left active in metric screen for now  Educated regarding tetanus and the dtr stated they would have this if she cut herself etc.; the patient would like to hold for now        Objective:     Vitals: BP 110/70   Pulse 69   Ht 5' (1.524 m)   Wt 146 lb (66.2 kg)   SpO2 95%   BMI 28.51 kg/m   Body mass index is 28.51 kg/m.   Tobacco History  Smoking Status  . Never Smoker  Smokeless Tobacco  . Never Used     Counseling given: Yes   Past Medical History:  Diagnosis Date  . BACK PAIN, LUMBAR, CHRONIC 02/20/2010  . Cardiomegaly 03/16/2010  . COLONIC POLYPS 02/20/2010  . DIVERTICULITIS, HX OF 02/20/2010  . GERD 02/20/2010  . GOITER 02/20/2010  . HYPERLIPIDEMIA 02/20/2010  . OSTEOARTHRITIS, MULTIPLE JOINTS 02/20/2010  . URINARY INCONTINENCE 02/20/2010   Past Surgical History:  Procedure Laterality Date  . ABDOMINAL HYSTERECTOMY    . NOSE SURGERY  1975   head surgery to repair cracked bone over nose   . SPINE SURGERY     4 back surgeries  . THYROIDECTOMY, PARTIAL     ? goiter, no CA  . TONSILLECTOMY  1936  . TOTAL SHOULDER ARTHROPLASTY  07/30/2011   Procedure: TOTAL SHOULDER ARTHROPLASTY;  Surgeon: Augustin Schooling, MD;  Location: [facility];  Service: Orthopedics;  Laterality: Right;  Right Reversed Total Shoulder   Family History  Problem Relation Age of Onset  . Cancer Mother     ovarian  . Hyperlipidemia Mother   . Diabetes Mother     type ll   History  Sexual Activity  . Sexual activity: Not on file    Outpatient Encounter Prescriptions as of 01/13/2016  Medication Sig  . aspirin 81 MG tablet Take 81 mg by mouth daily.  . diphenhydramine-acetaminophen (TYLENOL PM EXTRA STRENGTH) 25-500 MG TABS tablet Take 1 tablet by mouth at bedtime as needed. Takes 3 tablets at night.  Mariane Baumgarten Calcium (STOOL SOFTENER PO) Take by mouth.  . fish oil-omega-3 fatty acids 1000 MG capsule Take 1 g by mouth 2 (two) times daily.  .  hydrochlorothiazide (HYDRODIURIL) 25 MG tablet TAKE 1 TABLET AS NEEDED FOR FEET AND ANKLE SWELLING (NEED APPOINTMENT) (Patient taking differently: TAKE 1 TABLET AS NEEDED FOR FEET AND ANKLE SWELLING)  . Multiple Vitamin (MULITIVITAMIN WITH MINERALS) TABS Take 1 tablet by mouth daily.  . traMADol (ULTRAM) 50 MG tablet Take 1 tablet (50 mg total) by mouth every 6 (six) hours as needed. For pain  . VESICARE 5 MG tablet TAKE 1 TABLET DAILY  No facility-administered encounter medications on file as of 01/13/2016.     Activities of Daily Living In your present state of health, do you have any difficulty performing the following activities: 01/13/2016  Hearing? Y  Vision? N  Some recent data might be hidden    Patient Care Team: Eulas Post, MD as PCP - General    Assessment:    82 yo becoming more frail; home care good for now Is alone 12 -6;   Basically sits or gets up to chair to play computer game  Discussed exercise; there is concern regarding her balance and stability as she needed assistance form 2 to get up from w/c and stand on scale  Family requesting "fully electric bed" which is not covered by Medicare to assist her with getting oob and turning  Per [employer] [facility]; Medicare covers only a semi-electric bed;   Plan: Continued deterioration of tone and balance. Does have life line when alone, but may recommend home PT to evaluate for fall prevention, review the patient current exercise and assess need for "fully electric hospital bed" vs semi electric with trapeze and exercise program and correct body movement getting in and out of bed.  The patient agreed to plan but will discuss with MD.   Exercise Activities and Dietary recommendations Encouraged chair exercises;  Encouraged deep breathing Encouraged her to get up from chair every 2 hours Mentioned and given resources for [facility] center to explore getting her in a social environment one day a week      Goals    .  patient          Keep exercising in the home. Walk around every 2 hours ; get up and down in chair  Deep breath every day 5 to 10 times       Fall Risk Fall Risk  01/13/2016 12/31/2015 08/23/2014  Falls in the past year? Yes No No  Number falls in past yr: 1 - -  Risk for fall due to : - History of fall(s) -  Follow up Education provided - -   Fall risk is high due to lack of tone; lack of exercise; starting to need full assistance to get up and move from chair to other and can't turn in bed; Hx of total shoulder arthroplasty 2013 and 2012  77 head injury 89 back surgery; still has numbness and "foot drop" in left foot.  Depression Screen PHQ 2/9 Scores 08/23/2014  PHQ - 2 Score 0    Neg Cognitive Function very good per screens     6CIT Screen 01/13/2016  What Year? 0 points  What month? 0 points  What time? 0 points  Count back from 20 0 points  Months in reverse 0 points  Repeat phrase 0 points  Total Score 0    Immunization History  Administered Date(s) Administered  . Influenza Whole 02/20/2010, 10/27/2010  . Influenza, High Dose Seasonal PF 12/31/2015  . Influenza,inj,Quad PF,36+ Mos 10/29/2013  . Pneumococcal Polysaccharide-23 02/16/2008   Screening Tests Health Maintenance  Topic Date Due  . PNA vac Low Risk Adult (2 of 2 - PCV13) 02/15/2009  . ZOSTAVAX  01/16/2016 (Originally 10/15/1989)  . DEXA SCAN  01/15/2017 (Originally 10/16/1994)  . TETANUS/TDAP  02/14/2017 (Originally 10/15/1946)  . INFLUENZA VACCINE  Completed      Plan:   Declines tdap, prevnar and shingles Does not feel she needs dexa scan  Good home support for frail and disabled adult  Would like to  attempt home PT to review issues to determine if "fully electric" bed needed or if the patient can benefit through targeted exercise and better postioning and body mechanics to get oob; will evaluate for safety and semi-electric bed;  Will defer to Dr. Elease Hashimoto   During the course of the visit  the patient was educated and counseled about the following appropriate screening and preventive services:   Vaccines to include Pneumoccal, Influenza, Hepatitis B, Td, Zostavax, HCV  Electrocardiogram  Cardiovascular Disease/neg  Colorectal cancer screening aged out  Bone density screening- declined  Diabetes screening -neg   Glaucoma screening- eye exam every year; able to read  Mammography/PAP/ deferred last one 2003  Nutrition counseling - adequate  Patient Instructions (the written plan) was given to the patient.   [id number], RN  01/13/2016   Agree with assessment and plan as above per Wynetta Fines. We have discussed with family possible PT/OT assessment to get their opinion regarding best bed option for her.  Eulas Post MD [facility]

## 2016-01-13 NOTE — Telephone Encounter (Signed)
Fup with [name] the dtr at [phone]  Discussed PT to evaluate limitations and to see of additional training would assist the patient'  Recommended contact with tricare Agreed to fup with [employer]icare to see if they will provide support for fully electric bed; Also will discuss PT with the patient. Discussed the risk of her continuing to lose function and balance.  Dtr states she will fup with [employer] and if they decide to have PT, they will call;  Will investigate alternatives first.

## 2016-01-13 NOTE — Telephone Encounter (Signed)
In for AWV Concerns; request for fully electric bed;  Barriers; medicare pays for semi electric; problems getting in and oob as well as turning.  Private pay fully electric bed is 996.00 per [facility]   No doubt she is at risk for falls; needing more assist of 2; incontinence; difficulty turning in bed and getting up; sedentary; left leg paresthesia since back surgery and c/o of not feeling her foot on the right.   Please advise if Home PT would be helpful to evaluate safety; offer exercise or teach better positioning to assist patient with her current limitations to see if an semi electric will meet her needs.  Please advise; see AWV

## 2016-01-13 NOTE — Patient Instructions (Addendum)
[name] , Thank you for taking time to come for your Medicare Wellness Visit. I appreciate your ongoing commitment to your health goals. Please review the following plan we discussed and let me know if I can assist you in the future.   Will take tetanus;  Declined pneumonia vaccine ( 2nd one ) prevnar 13  Postpone shingles  Will post post pone bone density scan    [facility]; [phone] Sr. Line; [phone]/ Get resource to get information on any and all community programs for Seniors    Adult center for Enrichment;  Call Senior Line; [phone]  Adult day services include Adult Day Care, Adult Day Healthcare, Group Respite, [facility], Volunteer In Home Respite, Education and Support Program    These are the goals we discussed: Goals    . patient          Keep exercising in the home. Walk around every 2 hours ; get up and down in chair  Deep breath every day 5 to 10 times        This is a list of the screening recommended for you and due dates:  Health Maintenance  Topic Date Due  . Pneumonia vaccines (2 of 2 - PCV13) 02/15/2009  . Shingles Vaccine  01/16/2016*  . DEXA scan (bone density measurement)  01/15/2017*  . Tetanus Vaccine  02/14/2017*  . Flu Shot  Completed  *Topic was postponed. The date shown is not the original due date.      Fall Prevention in the Home Introduction Falls can cause injuries. They can happen to people of all ages. There are many things you can do to make your home safe and to help prevent falls. What can I do on the outside of my home?  Regularly fix the edges of walkways and driveways and fix any cracks.  Remove anything that might make you trip as you walk through a door, such as a raised step or threshold.  Trim any bushes or trees on the path to your home.  Use bright outdoor lighting.  Clear any walking paths of anything that might make someone trip, such as rocks or tools.  Regularly check to see if  handrails are loose or broken. Make sure that both sides of any steps have handrails.  Any raised decks and porches should have guardrails on the edges.  Have any leaves, snow, or ice cleared regularly.  Use sand or salt on walking paths during winter.  Clean up any spills in your garage right away. This includes oil or grease spills. What can I do in the bathroom?  Use night lights.  Install grab bars by the toilet and in the tub and shower. Do not use towel bars as grab bars.  Use non-skid mats or decals in the tub or shower.  If you need to sit down in the shower, use a plastic, non-slip stool.  Keep the floor dry. Clean up any water that spills on the floor as soon as it happens.  Remove soap buildup in the tub or shower regularly.  Attach bath mats securely with double-sided non-slip rug tape.  Do not have throw rugs and other things on the floor that can make you trip. What can I do in the bedroom?  Use night lights.  Make sure that you have a light by your bed that is easy to reach.  Do not use any sheets or blankets that are too big for your bed. They should not hang   down onto the floor.  Have a firm chair that has side arms. You can use this for support while you get dressed.  Do not have throw rugs and other things on the floor that can make you trip. What can I do in the kitchen?  Clean up any spills right away.  Avoid walking on wet floors.  Keep items that you use a lot in easy-to-reach places.  If you need to reach something above you, use a strong step stool that has a grab bar.  Keep electrical cords out of the way.  Do not use floor polish or wax that makes floors slippery. If you must use wax, use non-skid floor wax.  Do not have throw rugs and other things on the floor that can make you trip. What can I do with my stairs?  Do not leave any items on the stairs.  Make sure that there are handrails on both sides of the stairs and use them. Fix  handrails that are broken or loose. Make sure that handrails are as long as the stairways.  Check any carpeting to make sure that it is firmly attached to the stairs. Fix any carpet that is loose or worn.  Avoid having throw rugs at the top or bottom of the stairs. If you do have throw rugs, attach them to the floor with carpet tape.  Make sure that you have a light switch at the top of the stairs and the bottom of the stairs. If you do not have them, ask someone to add them for you. What else can I do to help prevent falls?  Wear shoes that:  Do not have high heels.  Have rubber bottoms.  Are comfortable and fit you well.  Are closed at the toe. Do not wear sandals.  If you use a stepladder:  Make sure that it is fully opened. Do not climb a closed stepladder.  Make sure that both sides of the stepladder are locked into place.  Ask someone to hold it for you, if possible.  Clearly mark and make sure that you can see:  Any grab bars or handrails.  First and last steps.  Where the edge of each step is.  Use tools that help you move around (mobility aids) if they are needed. These include:  Canes.  Walkers.  Scooters.  Crutches.  Turn on the lights when you go into a dark area. Replace any light bulbs as soon as they burn out.  Set up your furniture so you have a clear path. Avoid moving your furniture around.  If any of your floors are uneven, fix them.  If there are any pets around you, be aware of where they are.  Review your medicines with your doctor. Some medicines can make you feel dizzy. This can increase your chance of falling. Ask your doctor what other things that you can do to help prevent falls. This information is not intended to replace advice given to you by your health care provider. Make sure you discuss any questions you have with your health care provider. Document Released: 11/28/2008 Document Revised: 07/10/2015 Document Reviewed:  03/08/2014  2017 Elsevier  Health Maintenance, Female Introduction Adopting a healthy lifestyle and getting preventive care can go a long way to promote health and wellness. Talk with your health care provider about what schedule of regular examinations is right for you. This is a good chance for you to check in with your provider about disease prevention   and staying healthy. In between checkups, there are plenty of things you can do on your own. Experts have done a lot of research about which lifestyle changes and preventive measures are most likely to keep you healthy. Ask your health care provider for more information. Weight and diet Eat a healthy diet  Be sure to include plenty of vegetables, fruits, low-fat dairy products, and lean protein.  Do not eat a lot of foods high in solid fats, added sugars, or salt.  Get regular exercise. This is one of the most important things you can do for your health.  Most adults should exercise for at least 150 minutes each week. The exercise should increase your heart rate and make you sweat (moderate-intensity exercise).  Most adults should also do strengthening exercises at least twice a week. This is in addition to the moderate-intensity exercise. Maintain a healthy weight  Body mass index (BMI) is a measurement that can be used to identify possible weight problems. It estimates body fat based on height and weight. Your health care provider can help determine your BMI and help you achieve or maintain a healthy weight.  For females 20 years of age and older:  A BMI below 18.5 is considered underweight.  A BMI of 18.5 to 24.9 is normal.  A BMI of 25 to 29.9 is considered overweight.  A BMI of 30 and above is considered obese. Watch levels of cholesterol and blood lipids  You should start having your blood tested for lipids and cholesterol at 80 years of age, then have this test every 5 years.  You may need to have your cholesterol levels  checked more often if:  Your lipid or cholesterol levels are high.  You are older than 80 years of age.  You are at high risk for heart disease. Cancer screening Lung Cancer  Lung cancer screening is recommended for adults 55-80 years old who are at high risk for lung cancer because of a history of smoking.  A yearly low-dose CT scan of the lungs is recommended for people who:  Currently smoke.  Have quit within the past 15 years.  Have at least a 30-pack-year history of smoking. A pack year is smoking an average of one pack of cigarettes a day for 1 year.  Yearly screening should continue until it has been 15 years since you quit.  Yearly screening should stop if you develop a health problem that would prevent you from having lung cancer treatment. Breast Cancer  Practice breast self-awareness. This means understanding how your breasts normally appear and feel.  It also means doing regular breast self-exams. Let your health care provider know about any changes, no matter how small.  If you are in your 20s or 30s, you should have a clinical breast exam (CBE) by a health care provider every 1-3 years as part of a regular health exam.  If you are 40 or older, have a CBE every year. Also consider having a breast X-ray (mammogram) every year.  If you have a family history of breast cancer, talk to your health care provider about genetic screening.  If you are at high risk for breast cancer, talk to your health care provider about having an MRI and a mammogram every year.  Breast cancer gene (BRCA) assessment is recommended for women who have family members with BRCA-related cancers. BRCA-related cancers include:  Breast.  Ovarian.  Tubal.  Peritoneal cancers.  Results of the assessment will determine the need for   genetic counseling and BRCA1 and BRCA2 testing. Cervical Cancer  Your health care provider may recommend that you be screened regularly for cancer of the pelvic  organs (ovaries, uterus, and vagina). This screening involves a pelvic examination, including checking for microscopic changes to the surface of your cervix (Pap test). You may be encouraged to have this screening done every 3 years, beginning at age 21.  For women ages 30-65, health care providers may recommend pelvic exams and Pap testing every 3 years, or they may recommend the Pap and pelvic exam, combined with testing for human papilloma virus (HPV), every 5 years. Some types of HPV increase your risk of cervical cancer. Testing for HPV may also be done on women of any age with unclear Pap test results.  Other health care providers may not recommend any screening for nonpregnant women who are considered low risk for pelvic cancer and who do not have symptoms. Ask your health care provider if a screening pelvic exam is right for you.  If you have had past treatment for cervical cancer or a condition that could lead to cancer, you need Pap tests and screening for cancer for at least 20 years after your treatment. If Pap tests have been discontinued, your risk factors (such as having a new sexual partner) need to be reassessed to determine if screening should resume. Some women have medical problems that increase the chance of getting cervical cancer. In these cases, your health care provider may recommend more frequent screening and Pap tests. Colorectal Cancer  This type of cancer can be detected and often prevented.  Routine colorectal cancer screening usually begins at 80 years of age and continues through 80 years of age.  Your health care provider may recommend screening at an earlier age if you have risk factors for colon cancer.  Your health care provider may also recommend using home test kits to check for hidden blood in the stool.  A small camera at the end of a tube can be used to examine your colon directly (sigmoidoscopy or colonoscopy). This is done to check for the earliest forms  of colorectal cancer.  Routine screening usually begins at age 50.  Direct examination of the colon should be repeated every 5-10 years through 80 years of age. However, you may need to be screened more often if early forms of precancerous polyps or small growths are found. Skin Cancer  Check your skin from head to toe regularly.  Tell your health care provider about any new moles or changes in moles, especially if there is a change in a mole's shape or color.  Also tell your health care provider if you have a mole that is larger than the size of a pencil eraser.  Always use sunscreen. Apply sunscreen liberally and repeatedly throughout the day.  Protect yourself by wearing long sleeves, pants, a wide-brimmed hat, and sunglasses whenever you are outside. Heart disease, diabetes, and high blood pressure  High blood pressure causes heart disease and increases the risk of stroke. High blood pressure is more likely to develop in:  People who have blood pressure in the high end of the normal range (130-139/85-89 mm Hg).  People who are overweight or obese.  People who are African American.  If you are 18-39 years of age, have your blood pressure checked every 3-5 years. If you are 40 years of age or older, have your blood pressure checked every year. You should have your blood pressure measured twice-once when   you are at a hospital or clinic, and once when you are not at a hospital or clinic. Record the average of the two measurements. To check your blood pressure when you are not at a hospital or clinic, you can use:  An automated blood pressure machine at a pharmacy.  A home blood pressure monitor.  If you are between 57 years and 73 years old, ask your health care provider if you should take aspirin to prevent strokes.  Have regular diabetes screenings. This involves taking a blood sample to check your fasting blood sugar level.  If you are at a normal weight and have a low risk for  diabetes, have this test once every three years after 80 years of age.  If you are overweight and have a high risk for diabetes, consider being tested at a younger age or more often. Preventing infection Hepatitis B  If you have a higher risk for hepatitis B, you should be screened for this virus. You are considered at high risk for hepatitis B if:  You were born in a country where hepatitis B is common. Ask your health care provider which countries are considered high risk.  Your parents were born in a high-risk country, and you have not been immunized against hepatitis B (hepatitis B vaccine).  You have HIV or AIDS.  You use needles to inject street drugs.  You live with someone who has hepatitis B.  You have had sex with someone who has hepatitis B.  You get hemodialysis treatment.  You take certain medicines for conditions, including cancer, organ transplantation, and autoimmune conditions. Hepatitis C  Blood testing is recommended for:  Everyone born from [birth year] through 1963.  Anyone with known risk factors for hepatitis C. Sexually transmitted infections (STIs)  You should be screened for sexually transmitted infections (STIs) including gonorrhea and chlamydia if:  You are sexually active and are younger than 80 years of age.  You are older than 80 years of age and your health care provider tells you that you are at risk for this type of infection.  Your sexual activity has changed since you were last screened and you are at an increased risk for chlamydia or gonorrhea. Ask your health care provider if you are at risk.  If you do not have HIV, but are at risk, it may be recommended that you take a prescription medicine daily to prevent HIV infection. This is called pre-exposure prophylaxis (PrEP). You are considered at risk if:  You are sexually active and do not regularly use condoms or know the HIV status of your partner(s).  You take drugs by injection.  You are  sexually active with a partner who has HIV. Talk with your health care provider about whether you are at high risk of being infected with HIV. If you choose to begin PrEP, you should first be tested for HIV. You should then be tested every 3 months for as long as you are taking PrEP. Pregnancy  If you are premenopausal and you may become pregnant, ask your health care provider about preconception counseling.  If you may become pregnant, take 400 to 800 micrograms (mcg) of folic acid every day.  If you want to prevent pregnancy, talk to your health care provider about birth control (contraception). Osteoporosis and menopause  Osteoporosis is a disease in which the bones lose minerals and strength with aging. This can result in serious bone fractures. Your risk for osteoporosis can be identified using a  bone density scan.  If you are 70 years of age or older, or if you are at risk for osteoporosis and fractures, ask your health care provider if you should be screened.  Ask your health care provider whether you should take a calcium or vitamin D supplement to lower your risk for osteoporosis.  Menopause may have certain physical symptoms and risks.  Hormone replacement therapy may reduce some of these symptoms and risks. Talk to your health care provider about whether hormone replacement therapy is right for you. Follow these instructions at home:  Schedule regular health, dental, and eye exams.  Stay current with your immunizations.  Do not use any tobacco products including cigarettes, chewing tobacco, or electronic cigarettes.  If you are pregnant, do not drink alcohol.  If you are breastfeeding, limit how much and how often you drink alcohol.  Limit alcohol intake to no more than 1 drink per day for nonpregnant women. One drink equals 12 ounces of beer, 5 ounces of wine, or 1 ounces of hard liquor.  Do not use street drugs.  Do not share needles.  Ask your health care  provider for help if you need support or information about quitting drugs.  Tell your health care provider if you often feel depressed.  Tell your health care provider if you have ever been abused or do not feel safe at home. This information is not intended to replace advice given to you by your health care provider. Make sure you discuss any questions you have with your health care provider. Document Released: 08/17/2010 Document Revised: 07/10/2015 Document Reviewed: 11/05/2014  2017 Elsevier

## 2016-04-12 ENCOUNTER — Other Ambulatory Visit: Payer: Self-pay | Admitting: Family Medicine

## 2016-04-13 NOTE — Telephone Encounter (Signed)
Last OV 12-31-2015 Last refill #90 on 12-31-2015 Please advise

## 2016-04-13 NOTE — Telephone Encounter (Signed)
Refill OK

## 2016-05-05 ENCOUNTER — Ambulatory Visit: Payer: Medicare Other | Admitting: Family Medicine

## 2016-05-07 ENCOUNTER — Ambulatory Visit (INDEPENDENT_AMBULATORY_CARE_PROVIDER_SITE_OTHER): Payer: Medicare Other | Admitting: Family Medicine

## 2016-05-07 VITALS — BP 102/70 | HR 82 | Temp 98.3°F | Ht 60.0 in | Wt 151.1 lb

## 2016-05-07 DIAGNOSIS — M79605 Pain in left leg: Secondary | ICD-10-CM | POA: Diagnosis not present

## 2016-05-07 MED ORDER — SOLIFENACIN SUCCINATE 5 MG PO TABS: 5.0000 mg | ORAL_TABLET | Freq: Every day | ORAL | 2 refills | Status: DC

## 2016-05-07 NOTE — Progress Notes (Signed)
Pre visit review using our clinic review tool, if applicable. No additional management support is needed unless otherwise documented below in the visit note. 

## 2016-05-07 NOTE — Progress Notes (Signed)
Subjective:     Patient ID: [name], female   DOB: Feb 11, 1928, 83 y.o.   MRN: [id number]  HPI Patient seen with acute/subacute left lateral leg pain. Onset about a month ago. She denies any injury. Location is proximal one third of the calf laterally. She's not seen any swelling, erythema, or bruising. She has chronic foot drop on the left side. She does not ambulate much. She's not had any calf pain medially and no thigh pain. No asymmetric edema. Symptoms are intermittent. No difficulties with ambulation.  She describes frequent "spasm" lower extremities at night. Sounds like she has some restless leg symptoms. She has frequent sensation of movement in both legs at night but not interfering with sleep. Denies any claudication-type symptoms.  Past Medical History:  Diagnosis Date  . BACK PAIN, LUMBAR, CHRONIC 02/20/2010  . Cardiomegaly 03/16/2010  . COLONIC POLYPS 02/20/2010  . DIVERTICULITIS, HX OF 02/20/2010  . GERD 02/20/2010  . GOITER 02/20/2010  . HYPERLIPIDEMIA 02/20/2010  . OSTEOARTHRITIS, MULTIPLE JOINTS 02/20/2010  . URINARY INCONTINENCE 02/20/2010   Past Surgical History:  Procedure Laterality Date  . ABDOMINAL HYSTERECTOMY    . NOSE SURGERY  1975   head surgery to repair cracked bone over nose   . SPINE SURGERY     4 back surgeries  . THYROIDECTOMY, PARTIAL     ? goiter, no CA  . TONSILLECTOMY  1936  . TOTAL SHOULDER ARTHROPLASTY  07/30/2011   Procedure: TOTAL SHOULDER ARTHROPLASTY;  Surgeon: Augustin Schooling, MD;  Location: [facility];  Service: Orthopedics;  Laterality: Right;  Right Reversed Total Shoulder    reports that she has never smoked. She has never used smokeless tobacco. She reports that she does not drink alcohol or use drugs. family history includes Cancer in her mother; Diabetes in her mother; Hyperlipidemia in her mother. No Known Allergies   Review of Systems  Constitutional: Negative for chills and fever.  Respiratory: Negative for shortness of breath.    Cardiovascular: Negative for chest pain.  Neurological: Negative for weakness and numbness.       Objective:   Physical Exam  Constitutional: She appears well-developed and well-nourished.  Cardiovascular: Normal rate and regular rhythm.   Pulmonary/Chest: Effort normal and breath sounds normal. No respiratory distress. She has no wheezes. She has no rales.  Musculoskeletal: She exhibits no edema.  She has minimal soft tissue muscular tenderness left lateral proximal leg but no overlying erythema and no visible swelling. She has no edema comparing the left calf or leg to the right side. Distal pulses are normal. Full range of motion left knee. No erythema or warmth       Assessment:     Localized left proximal lateral leg pain of uncertain etiology. She does not have any palpable veins, erythema, or warmth to suggest likely superficial phlebitis. Symptoms are intermittent. No clinical suspicion of DVT  Patient also describing possible restless leg symptoms     Plan:     -Observation for now. -Avoidance of caffeine at night and late afternoon -We discussed possible treatments for restless leg but at this point they wish to observe  Eulas Post MD [facility]

## 2016-05-07 NOTE — Patient Instructions (Signed)
Follow up for any leg edema, redness, or warmth.

## 2016-07-13 ENCOUNTER — Telehealth: Payer: Self-pay | Admitting: Family Medicine

## 2016-07-13 NOTE — Telephone Encounter (Signed)
Pt needs new rx tramadol 50 mg [facility]

## 2016-07-14 MED ORDER — TRAMADOL HCL 50 MG PO TABS: 50.0000 mg | ORAL_TABLET | Freq: Four times a day (QID) | ORAL | 0 refills | Status: DC | PRN

## 2016-07-14 NOTE — Telephone Encounter (Signed)
Last rx given on 2/28 for #90 with no ref

## 2016-07-14 NOTE — Telephone Encounter (Signed)
Printed Rx faxed to [place] at [phone] and I called the pt and informed her of this.

## 2016-07-14 NOTE — Telephone Encounter (Signed)
Ok to refill 

## 2016-09-24 ENCOUNTER — Encounter: Payer: Self-pay | Admitting: Family Medicine

## 2016-09-24 ENCOUNTER — Other Ambulatory Visit: Payer: Medicare Other

## 2016-09-24 ENCOUNTER — Ambulatory Visit (INDEPENDENT_AMBULATORY_CARE_PROVIDER_SITE_OTHER): Payer: Medicare Other | Admitting: Family Medicine

## 2016-09-24 ENCOUNTER — Ambulatory Visit: Payer: Medicare Other | Admitting: Family Medicine

## 2016-09-24 VITALS — BP 102/62 | HR 68 | Temp 97.9°F | Ht 60.0 in | Wt 150.0 lb

## 2016-09-24 DIAGNOSIS — E784 Other hyperlipidemia: Secondary | ICD-10-CM

## 2016-09-24 DIAGNOSIS — E7849 Other hyperlipidemia: Secondary | ICD-10-CM

## 2016-09-24 DIAGNOSIS — R197 Diarrhea, unspecified: Secondary | ICD-10-CM

## 2016-09-24 LAB — CBC WITH DIFFERENTIAL/PLATELET
BASOS PCT: 0.5 % (ref 0.0–3.0)
Basophils Absolute: 0 10*3/uL (ref 0.0–0.1)
EOS ABS: 0.1 10*3/uL (ref 0.0–0.7)
EOS PCT: 1.9 % (ref 0.0–5.0)
HCT: 44.2 % (ref 36.0–46.0)
HEMOGLOBIN: 14.5 g/dL (ref 12.0–15.0)
Lymphocytes Relative: 40.2 % (ref 12.0–46.0)
Lymphs Abs: 2.2 10*3/uL (ref 0.7–4.0)
MCHC: 32.9 g/dL (ref 30.0–36.0)
MCV: 87.7 fl (ref 78.0–100.0)
MONO ABS: 0.5 10*3/uL (ref 0.1–1.0)
Monocytes Relative: 9.8 % (ref 3.0–12.0)
NEUTROS ABS: 2.6 10*3/uL (ref 1.4–7.7)
Neutrophils Relative %: 47.6 % (ref 43.0–77.0)
PLATELETS: 290 10*3/uL (ref 150.0–400.0)
RBC: 5.04 Mil/uL (ref 3.87–5.11)
RDW: 14.6 % (ref 11.5–15.5)
WBC: 5.5 10*3/uL (ref 4.0–10.5)

## 2016-09-24 LAB — BASIC METABOLIC PANEL
BUN: 13 mg/dL (ref 6–23)
CALCIUM: 9.5 mg/dL (ref 8.4–10.5)
CO2: 29 mEq/L (ref 19–32)
Chloride: 105 mEq/L (ref 96–112)
Creatinine, Ser: 0.69 mg/dL (ref 0.40–1.20)
GFR: 85.55 mL/min (ref >60.00)
GLUCOSE: 87 mg/dL (ref 70–99)
POTASSIUM: 4.2 meq/L (ref 3.5–5.1)
SODIUM: 139 meq/L (ref 135–145)

## 2016-09-24 NOTE — Progress Notes (Signed)
Subjective:    Patient ID: [name], female    DOB: 16-Mar-1927, 83 y.o.   MRN: [id number]  Chief Complaint  Patient presents with  . Acute Visit    Sx started last saturday, very loose stool, every day, yesterday noticed it was black in color, Denies abdominal pain, dizziness,dysuria, any changes in food diet,nausea,vomit    HPI Patient is in today for acute complaint of dark stool and diarrhea.  Pt is accompanied by her daughter, Josephina Shih. -Pt endorses loose stools x ~1 wk.  Has 1-3 episodes per week. -Pt endorses 1 episode of black sticky stool yesterday. -Pt had tried Kaopectate, Pepto Bismol (last use a few days ago), and probiotics off and on -Pt denies recent abx use, N/V, Fever, chills, abd pain, h/o peptic ulcer -Recent sick contacts include her caregiver -Pt typically on a stool softner (Dcousate) but stopped 2 days ago. -Eating and drinking ok.  Slightly decreased appetite this am.  Past Medical History:  Diagnosis Date  . BACK PAIN, LUMBAR, CHRONIC 02/20/2010  . Cardiomegaly 03/16/2010  . COLONIC POLYPS 02/20/2010  . DIVERTICULITIS, HX OF 02/20/2010  . GERD 02/20/2010  . GOITER 02/20/2010  . HYPERLIPIDEMIA 02/20/2010  . OSTEOARTHRITIS, MULTIPLE JOINTS 02/20/2010  . URINARY INCONTINENCE 02/20/2010    Past Surgical History:  Procedure Laterality Date  . ABDOMINAL HYSTERECTOMY    . NOSE SURGERY  1975   head surgery to repair cracked bone over nose   . SPINE SURGERY     4 back surgeries  . THYROIDECTOMY, PARTIAL     ? goiter, no CA  . TONSILLECTOMY  1936  . TOTAL SHOULDER ARTHROPLASTY  07/30/2011   Procedure: TOTAL SHOULDER ARTHROPLASTY;  Surgeon: Augustin Schooling, MD;  Location: [facility];  Service: Orthopedics;  Laterality: Right;  Right Reversed Total Shoulder    Family History  Problem Relation Age of Onset  . Cancer Mother        ovarian  . Hyperlipidemia Mother   . Diabetes Mother        type ll    Social History   Social History  . Marital status: Married     Spouse name: N/A  . Number of children: N/A  . Years of education: N/A   Occupational History  . Not on file.   Social History Main Topics  . Smoking status: Never Smoker  . Smokeless tobacco: Never Used  . Alcohol use No  . Drug use: No  . Sexual activity: Not on file   Other Topics Concern  . Not on file   Social History Narrative  . No narrative on file    Outpatient Medications Prior to Visit  Medication Sig Dispense Refill  . aspirin 81 MG tablet Take 81 mg by mouth daily.    . diphenhydramine-acetaminophen (TYLENOL PM EXTRA STRENGTH) 25-500 MG TABS tablet Take 1 tablet by mouth at bedtime as needed. Takes 3 tablets at night.    Mariane Baumgarten Calcium (STOOL SOFTENER PO) Take by mouth.    . fish oil-omega-3 fatty acids 1000 MG capsule Take 1 g by mouth 2 (two) times daily.    . hydrochlorothiazide (HYDRODIURIL) 25 MG tablet TAKE 1 TABLET AS NEEDED FOR FEET AND ANKLE SWELLING (NEED APPOINTMENT) (Patient taking differently: TAKE 1 TABLET AS NEEDED FOR FEET AND ANKLE SWELLING) 90 tablet 0  . Multiple Vitamin (MULITIVITAMIN WITH MINERALS) TABS Take 1 tablet by mouth daily.    . solifenacin (VESICARE) 5 MG tablet Take 1 tablet (5 mg total)  by mouth daily. 90 tablet 2  . traMADol (ULTRAM) 50 MG tablet Take 1 tablet (50 mg total) by mouth every 6 (six) hours as needed. for pain 90 tablet 0   No facility-administered medications prior to visit.     No Known Allergies  ROS  General: Denies fever, chills, night sweats, changes in weight,  +Decreased appetite this am HEENT: Denies headaches, ear pain, changes in vision, rhinorrhea, sore throat CV: Denies CP, palpitations, SOB, orthopnea Pulm: Denies SOB, cough, wheezing GI: Denies abdominal pain, nausea, vomiting, constipation  +diarrhea, dark stool x 1 GU: Denies dysuria, hematuria, frequency, vaginal discharge Msk: Denies muscle cramps, joint pains Neuro: Denies weakness, numbness, tingling Skin: Denies rashes, bruising Psych:  Denies depression, anxiety, hallucinations      Objective:    BP 102/62 (BP Location: Left Arm, Cuff Size: Large)   Pulse 68   Temp 97.9 F (36.6 C) (Oral)   Ht 5' (1.524 m)   Wt 150 lb (68 kg)   SpO2 96%   BMI 29.29 kg/m   Physical Exam: Gen. Pleasant, well-nourished, in no distress, normal affect. Sitting in wheelchair, able to stand and pivot for exam. HEENT: [place]/AT, no lesions, face symmetric, no scleral icterus, PERRLA, no nasal drainage. No cervical lymph nodes palpated  Lungs: no accessory muscle uss, CTAB, no wheezes or rales Cardiovascular: RR, heart sounds  normal, no m/r/g.  LLE with mild pitting edema.   Abdomen: soft and non-tender, no hepatosplenomegaly, BS normal.   Musculoskeletal: No deformities, no cyanosis or clubbing, normal tone.  No TTP of b/l LEs Neuro:  A&Ox3, CN II-XII grossly intact Skin:  Warm, no tenting, no lesions/ rash GU: Buttocks without erythema or irritation.  No rashes.  Small external hemorrhoid visible, non erythematous.  Normal rectal tone, no stool in rectal vault.  Guaiac negative.   Wt Readings from Last 3 Encounters:  05/07/16 151 lb 1.6 oz (68.5 kg)  01/13/16 146 lb (66.2 kg)  12/31/15 145 lb (65.8 kg)    Diabetic Foot Exam - Simple   No data filed     Lab Results  Component Value Date   WBC 6.1 07/26/2011   HGB 13.9 07/26/2011   HCT 41.6 07/26/2011   PLT 238 07/26/2011   GLUCOSE 86 12/31/2015   CHOL 236 (H) 10/29/2013   TRIG 192.0 (H) 10/29/2013   HDL 38.40 (L) 10/29/2013   LDLCALC 159 (H) 10/29/2013   ALT 15 10/29/2013   AST 20 10/29/2013   NA 134 (L) 12/31/2015   K 4.3 12/31/2015   CL 100 12/31/2015   CREATININE 0.59 12/31/2015   BUN 18 12/31/2015   CO2 25 12/31/2015   TSH 1.29 01/11/2011   INR 3.9 11/30/2010    Assessment/Plan: 1. Diarrhea in adult patient Discussed likely viral.  -will obtain at home stool studies given h/o black stools.  Discussed likely 2/2 Pepto Bismol use. -Guaiac negative in  office. - CBC with Differential/Platelet - Basic metabolic panel - POC Hemoccult Bld/Stl (3-Cd Home Screen); Future -Pt advised ok to take probiotics daily while stools are loose.  Pt also advised to continue ASA 81 mg daily. -Given RTC precautions.

## 2016-09-24 NOTE — Patient Instructions (Addendum)
Diarrhea, Adult Diarrhea is frequent loose and watery bowel movements. Diarrhea can make you feel weak and cause you to become dehydrated. Dehydration can make you tired and thirsty, cause you to have a dry mouth, and decrease how often you urinate. Diarrhea typically lasts 2-3 days. However, it can last longer if it is a sign of something more serious. It is important to treat your diarrhea as told by your health care provider. Follow these instructions at home: Eating and drinking  Follow these recommendations as told by your health care provider:  Take an oral rehydration solution (ORS). This is a drink that is sold at pharmacies and retail stores.  Drink clear fluids, such as water, ice chips, diluted fruit juice, and low-calorie sports drinks.  Eat bland, easy-to-digest foods in small amounts as you are able. These foods include bananas, applesauce, rice, lean meats, toast, and crackers.  Avoid drinking fluids that contain a lot of sugar or caffeine, such as energy drinks, sports drinks, and soda.  Avoid alcohol.  Avoid spicy or fatty foods.  General instructions  Drink enough fluid to keep your urine clear or pale yellow.  Wash your hands often. If soap and water are not available, use hand sanitizer.  Make sure that all people in your household wash their hands well and often.  Take over-the-counter and prescription medicines only as told by your health care provider.  Rest at home while you recover.  Watch your condition for any changes.  Take a warm bath to relieve any burning or pain from frequent diarrhea episodes.  Keep all follow-up visits as told by your health care provider. This is important. Contact a health care provider if:  You have a fever.  Your diarrhea gets worse.  You have new symptoms.  You cannot keep fluids down.  You feel light-headed or dizzy.  You have a headache  You have muscle cramps. Get help right away if:  You have chest  pain.  You feel extremely weak or you faint.  You have bloody or black stools or stools that look like tar.  You have severe pain, cramping, or bloating in your abdomen.  You have trouble breathing or you are breathing very quickly.  Your heart is beating very quickly.  Your skin feels cold and clammy.  You feel confused.  You have signs of dehydration, such as: ? Dark urine, very little urine, or no urine. ? Cracked lips. ? Dry mouth. ? Sunken eyes. ? Sleepiness. ? Weakness. This information is not intended to replace advice given to you by your health care provider. Make sure you discuss any questions you have with your health care provider. Document Released: 01/22/2002 Document Revised: 06/12/2015 Document Reviewed: 10/08/2014 Elsevier Interactive Patient Education  2017 [employer].    At today's visit you were given stool study cards.  Please obtain samples from 3 different days.  After complete the samples can be dropped off by the lab.  Please place them in the envelope provided.   At today's visit you were asked to stop by the lab.  The results of your blood work will be communicated to you in 2-3 business days.

## 2016-09-28 ENCOUNTER — Ambulatory Visit: Payer: Medicare Other | Admitting: Family Medicine

## 2016-09-29 ENCOUNTER — Other Ambulatory Visit: Payer: Medicare Other

## 2016-09-29 ENCOUNTER — Telehealth: Payer: Self-pay | Admitting: Family Medicine

## 2016-09-29 LAB — POC HEMOCCULT BLD/STL (HOME/3-CARD/SCREEN)
Card #3 Fecal Occult Blood, POC: NEGATIVE
FECAL OCCULT BLD: NEGATIVE
Fecal Occult Blood, POC: NEGATIVE

## 2016-09-29 NOTE — Addendum Note (Signed)
Addended by: Tomi Likens on: 09/29/2016 09:17 AM   Modules accepted: Orders

## 2016-09-29 NOTE — Telephone Encounter (Signed)
Called pt to inform her the extra test (lipids and LFTs) requested by her daughter could not be done as the lab did not have enough blood.  Also informed pt of hemoccult stool cards brought in this am were negative for blood.    Pt mentions she is still having some loose stools (1-2) QOD.  Pt denied taking probiotic.  Encouraged her to start taking probiotic daily as discussed during her last OFV.

## 2016-10-04 ENCOUNTER — Encounter: Payer: Self-pay | Admitting: Family Medicine

## 2016-10-04 ENCOUNTER — Ambulatory Visit (INDEPENDENT_AMBULATORY_CARE_PROVIDER_SITE_OTHER): Payer: Medicare Other | Admitting: Family Medicine

## 2016-10-04 VITALS — BP 108/68 | HR 79 | Temp 98.3°F

## 2016-10-04 DIAGNOSIS — R197 Diarrhea, unspecified: Secondary | ICD-10-CM

## 2016-10-04 MED ORDER — TRAMADOL HCL 50 MG PO TABS: 50.0000 mg | ORAL_TABLET | Freq: Two times a day (BID) | ORAL | 1 refills | Status: DC | PRN

## 2016-10-04 NOTE — Patient Instructions (Signed)
Food Choices to Help Relieve Diarrhea, Adult When you have diarrhea, the foods you eat and your eating habits are very important. Choosing the right foods and drinks can help:  Relieve diarrhea.  Replace lost fluids and nutrients.  Prevent dehydration.  What general guidelines should I follow? Relieving diarrhea  Choose foods with less than 2 g or .07 oz. of fiber per serving.  Limit fats to less than 8 tsp (38 g or 1.34 oz.) a day.  Avoid the following: ? Foods and beverages sweetened with high-fructose corn syrup, honey, or sugar alcohols such as xylitol, sorbitol, and mannitol. ? Foods that contain a lot of fat or sugar. ? Fried, greasy, or spicy foods. ? High-fiber grains, breads, and cereals. ? Raw fruits and vegetables.  Eat foods that are rich in probiotics. These foods include dairy products such as yogurt and fermented milk products. They help increase healthy bacteria in the stomach and intestines (gastrointestinal tract, or GI tract).  If you have lactose intolerance, avoid dairy products. These may make your diarrhea worse.  Take medicine to help stop diarrhea (antidiarrheal medicine) only as told by your health care provider. Replacing nutrients  Eat small meals or snacks every 3-4 hours.  Eat bland foods, such as white rice, toast, or baked potato, until your diarrhea starts to get better. Gradually reintroduce nutrient-rich foods as tolerated or as told by your health care provider. This includes: ? Well-cooked protein foods. ? Peeled, seeded, and soft-cooked fruits and vegetables. ? Low-fat dairy products.  Take vitamin and mineral supplements as told by your health care provider. Preventing dehydration   Start by sipping water or a special solution to prevent dehydration (oral rehydration solution, ORS). Urine that is clear or pale yellow means that you are getting enough fluid.  Try to drink at least 8-10 cups of fluid each day to help replace lost  fluids.  You may add other liquids in addition to water, such as clear juice or decaffeinated sports drinks, as tolerated or as told by your health care provider.  Avoid drinks with caffeine, such as coffee, tea, or soft drinks.  Avoid alcohol. What foods are recommended? The items listed may not be a complete list. Talk with your health care provider about what dietary choices are best for you. Grains White rice. White, French, or pita breads (fresh or toasted), including plain rolls, buns, or bagels. White pasta. Saltine, soda, or graham crackers. Pretzels. Low-fiber cereal. Cooked cereals made with water (such as cornmeal, farina, or cream cereals). Plain muffins. Matzo. Melba toast. Zwieback. Vegetables Potatoes (without the skin). Most well-cooked and canned vegetables without skins or seeds. Tender lettuce. Fruits Apple sauce. Fruits canned in juice. Cooked apricots, cherries, grapefruit, peaches, pears, or plums. Fresh bananas and cantaloupe. Meats and other protein foods Baked or boiled chicken. Eggs. Tofu. Fish. Seafood. Smooth nut butters. Ground or well-cooked tender beef, ham, veal, lamb, pork, or poultry. Dairy Plain yogurt, kefir, and unsweetened liquid yogurt. Lactose-free milk, buttermilk, skim milk, or soy milk. Low-fat or nonfat hard cheese. Beverages Water. Low-calorie sports drinks. Fruit juices without pulp. Strained tomato and vegetable juices. Decaffeinated teas. Sugar-free beverages not sweetened with sugar alcohols. Oral rehydration solutions, if approved by your health care provider. Seasoning and other foods Bouillon, broth, or soups made from recommended foods. What foods are not recommended? The items listed may not be a complete list. Talk with your health care provider about what dietary choices are best for you. Grains Whole grain, whole wheat,   bran, or rye breads, rolls, pastas, and crackers. Wild or brown rice. Whole grain or bran cereals. Barley. Oats and  oatmeal. Corn tortillas or taco shells. Granola. Popcorn. Vegetables Raw vegetables. Fried vegetables. Cabbage, broccoli, Brussels sprouts, artichokes, baked beans, beet greens, corn, kale, legumes, peas, sweet potatoes, and yams. Potato skins. Cooked spinach and cabbage. Fruits Dried fruit, including raisins and dates. Raw fruits. Stewed or dried prunes. Canned fruits with syrup. Meat and other protein foods Fried or fatty meats. Deli meats. Chunky nut butters. Nuts and seeds. Beans and lentils. Bacon. Hot dogs. Sausage. Dairy High-fat cheeses. Whole milk, chocolate milk, and beverages made with milk, such as milk shakes. Half-and-half. Cream. sour cream. Ice cream. Beverages Caffeinated beverages (such as coffee, tea, soda, or energy drinks). Alcoholic beverages. Fruit juices with pulp. Prune juice. Soft drinks sweetened with high-fructose corn syrup or sugar alcohols. High-calorie sports drinks. Fats and oils Butter. Cream sauces. Margarine. Salad oils. Plain salad dressings. Olives. Avocados. Mayonnaise. Sweets and desserts Sweet rolls, doughnuts, and sweet breads. Sugar-free desserts sweetened with sugar alcohols such as xylitol and sorbitol. Seasoning and other foods Honey. Hot sauce. Chili powder. Gravy. Cream-based or milk-based soups. Pancakes and waffles. Summary  When you have diarrhea, the foods you eat and your eating habits are very important.  Make sure you get at least 8-10 cups of fluid each day, or enough to keep your urine clear or pale yellow.  Eat bland foods and gradually reintroduce healthy, nutrient-rich foods as tolerated, or as told by your health care provider.  Avoid high-fiber, fried, greasy, or spicy foods. This information is not intended to replace advice given to you by your health care provider. Make sure you discuss any questions you have with your health care provider. Document Released: 04/24/2003 Document Revised: 01/30/2016 Document Reviewed:  01/30/2016 Elsevier Interactive Patient Education  2017 Elsevier Inc.  

## 2016-10-04 NOTE — Progress Notes (Signed)
Subjective:     Patient ID: [name], female   DOB: [birth year], 83 y.o.   MRN: [id number]  HPI Patient seen with persistent diarrhea. She was seen here last week with diarrhea symptoms which are relatively mild. She describes some black in color stool which is probably related to Pepto-Bismol. Hemoccults were negative. She's had now 16 day duration of loose stools. She states 1-2 per day. No recent antibiotics. No recent travels. No recent medication changes. Denies any associated fever, chills, nausea, vomiting.  She has had fairly well-maintained appetite. No longer taking stool softener. She is currently taking Imodium about once per day.Recent CBC and basic metabolic panel last week were normal  Past Medical History:  Diagnosis Date  . BACK PAIN, LUMBAR, CHRONIC 02/20/2010  . Cardiomegaly 03/16/2010  . COLONIC POLYPS 02/20/2010  . DIVERTICULITIS, HX OF 02/20/2010  . GERD 02/20/2010  . GOITER 02/20/2010  . HYPERLIPIDEMIA 02/20/2010  . OSTEOARTHRITIS, MULTIPLE JOINTS 02/20/2010  . URINARY INCONTINENCE 02/20/2010   Past Surgical History:  Procedure Laterality Date  . ABDOMINAL HYSTERECTOMY    . NOSE SURGERY  1975   head surgery to repair cracked bone over nose   . SPINE SURGERY     4 back surgeries  . THYROIDECTOMY, PARTIAL     ? goiter, no CA  . TONSILLECTOMY  1936  . TOTAL SHOULDER ARTHROPLASTY  07/30/2011   Procedure: TOTAL SHOULDER ARTHROPLASTY;  Surgeon: Augustin Schooling, MD;  Location: [facility];  Service: Orthopedics;  Laterality: Right;  Right Reversed Total Shoulder    reports that she has never smoked. She has never used smokeless tobacco. She reports that she does not drink alcohol or use drugs. family history includes Cancer in her mother; Diabetes in her mother; Hyperlipidemia in her mother. No Known Allergies   Review of Systems  Constitutional: Negative for chills and fever.  Gastrointestinal: Positive for diarrhea. Negative for abdominal pain, blood in stool, nausea and  vomiting.       Objective:   Physical Exam  Constitutional: She appears well-developed and well-nourished.  HENT:  Mouth/Throat: Oropharynx is clear and moist.  Cardiovascular: Normal rate and regular rhythm.   Pulmonary/Chest: Effort normal and breath sounds normal. No respiratory distress. She has no wheezes. She has no rales.  Abdominal: Soft. She exhibits no distension and no mass. There is no tenderness. There is no rebound and no guarding.  Neurological: She is alert.       Assessment:     Patient presents with 16 day history of loose stools and diarrhea. Symptoms are relatively mild in terms of frequency. Doubt bacterial. Doubt C. difficile. Does not appear clinically dehydrated    Plan:     -We explained limited role for pathogen testing but given this is going on third week will go ahead and check GI pathogen panel. -Handout on appropriate diet for diarrhea given -May continue Imodium as needed but be prepared to stop quickly if she has any reversal of diarrhea -Consider repeat TSH if symptoms persist.  Eulas Post MD [facility]

## 2016-10-14 LAB — GASTROINTESTINAL PATHOGEN PANEL PCR
C. difficile Tox A/B, PCR: NOT DETECTED
Campylobacter, PCR: NOT DETECTED
Cryptosporidium, PCR: NOT DETECTED
E COLI (ETEC) LT/ST, PCR: NOT DETECTED
E COLI 0157, PCR: NOT DETECTED
E coli (STEC) stx1/stx2, PCR: NOT DETECTED
GIARDIA LAMBLIA, PCR: NOT DETECTED
Norovirus, PCR: NOT DETECTED
Rotavirus A, PCR: NOT DETECTED
SALMONELLA, PCR: NOT DETECTED
SHIGELLA, PCR: NOT DETECTED

## 2016-12-21 DIAGNOSIS — Z23 Encounter for immunization: Secondary | ICD-10-CM | POA: Diagnosis not present

## 2017-02-14 DIAGNOSIS — L602 Onychogryphosis: Secondary | ICD-10-CM | POA: Diagnosis not present

## 2017-02-14 DIAGNOSIS — S90212A Contusion of left great toe with damage to nail, initial encounter: Secondary | ICD-10-CM | POA: Diagnosis not present

## 2017-04-04 ENCOUNTER — Telehealth: Payer: Self-pay | Admitting: Family Medicine

## 2017-04-04 NOTE — Telephone Encounter (Signed)
Tramadol refill Last OV: 10/04/16 Last Refill:10/04/16 90/1 refill Pharmacy:[facility] [phone]

## 2017-04-04 NOTE — Telephone Encounter (Signed)
Copied from [place] [id number]. Topic: Quick Communication - Rx Refill/Question >> Apr 04, 2017  3:31 PM Boyd Kerbs wrote:  Medication: traMADol (ULTRAM) 50 MG tablet   Has the patient contacted their pharmacy? No.   (Agent: If no, request that the patient contact the pharmacy for the refill.)   Preferred Pharmacy (with phone number or street name): [place], [place] [address] [place] [address] Phone: [phone] Fax: [phone]  Agent: Please be advised that RX refills may take up to 3 business days. We ask that you follow-up with your pharmacy.

## 2017-04-04 NOTE — Telephone Encounter (Signed)
Refill OK with one additional refill. 

## 2017-04-05 MED ORDER — TRAMADOL HCL 50 MG PO TABS: 50.0000 mg | ORAL_TABLET | Freq: Two times a day (BID) | ORAL | 1 refills | Status: DC | PRN

## 2017-04-05 NOTE — Telephone Encounter (Signed)
Refill faxed and confirmed to [facility]

## 2017-04-20 ENCOUNTER — Ambulatory Visit (INDEPENDENT_AMBULATORY_CARE_PROVIDER_SITE_OTHER): Payer: Medicare Other | Admitting: Family Medicine

## 2017-04-20 ENCOUNTER — Encounter: Payer: Self-pay | Admitting: Family Medicine

## 2017-04-20 VITALS — BP 124/88 | HR 62 | Temp 98.2°F | Ht 59.0 in | Wt 151.0 lb

## 2017-04-20 DIAGNOSIS — M545 Low back pain, unspecified: Secondary | ICD-10-CM

## 2017-04-20 DIAGNOSIS — Z23 Encounter for immunization: Secondary | ICD-10-CM

## 2017-04-20 DIAGNOSIS — R29898 Other symptoms and signs involving the musculoskeletal system: Secondary | ICD-10-CM

## 2017-04-20 DIAGNOSIS — G8929 Other chronic pain: Secondary | ICD-10-CM

## 2017-04-20 DIAGNOSIS — M159 Polyosteoarthritis, unspecified: Secondary | ICD-10-CM

## 2017-04-20 DIAGNOSIS — M150 Primary generalized (osteo)arthritis: Secondary | ICD-10-CM

## 2017-04-20 NOTE — Progress Notes (Signed)
Subjective:     Patient ID: [name], female   DOB: 08-28-1927, 84 y.o.   MRN: [id number]  HPI Patient seen for medical follow-up and also to complete Department of motor vehicle forms. She has history of diverticulitis, chronic left lower extremity weakness following back surgery several years ago, osteoarthritis involving multiple joints, hyperlipidemia, urinary urgency  She still drives but only basically to church -which is less than a couple miles from her house. 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 She has good strength in her right lower extremity but has foot drop on the left side which has been present for several years. She's never had any accidents she states. She does not have any history of loss of consciousness or any cardiac or pulmonary issues. No history of seizures.  She takes usually about 1 tramadol at night to help with sleep to help ease her osteoarthritis pains.  She's had shoulder replacement bilaterally but has fairly good range of motion both shoulders  Past Medical History:  Diagnosis Date  . BACK PAIN, LUMBAR, CHRONIC 02/20/2010  . Cardiomegaly 03/16/2010  . COLONIC POLYPS 02/20/2010  . DIVERTICULITIS, HX OF 02/20/2010  . GERD 02/20/2010  . GOITER 02/20/2010  . HYPERLIPIDEMIA 02/20/2010  . OSTEOARTHRITIS, MULTIPLE JOINTS 02/20/2010  . URINARY INCONTINENCE 02/20/2010   Past Surgical History:  Procedure Laterality Date  . ABDOMINAL HYSTERECTOMY    . NOSE SURGERY  1975   head surgery to repair cracked bone over nose   . SPINE SURGERY     4 back surgeries  . THYROIDECTOMY, PARTIAL     ? goiter, no CA  . TONSILLECTOMY  1936  . TOTAL SHOULDER ARTHROPLASTY  07/30/2011   Procedure: TOTAL SHOULDER ARTHROPLASTY;  Surgeon: Augustin Schooling, MD;  Location: [facility];  Service: Orthopedics;  Laterality: Right;  Right Reversed Total Shoulder    reports that  has never smoked. she has never used smokeless tobacco. She reports that she does not  drink alcohol or use drugs. family history includes Cancer in her mother; Diabetes in her mother; Hyperlipidemia in her mother. No Known Allergies   Review of Systems  Constitutional: Negative for fatigue.  Eyes: Negative for visual disturbance.  Respiratory: Negative for cough, chest tightness, shortness of breath and wheezing.   Cardiovascular: Negative for chest pain, palpitations and leg swelling.  Musculoskeletal: Positive for arthralgias and back pain.  Neurological: Negative for dizziness, seizures, syncope, weakness, light-headedness and headaches.       Objective:   Physical Exam  Constitutional: She is oriented to person, place, and time.  Neck: Neck supple.  Cardiovascular: Normal rate and regular rhythm.  Pulmonary/Chest: Effort normal and breath sounds normal. No respiratory distress. She has no wheezes. She has no rales.  Neurological: She is alert and oriented to person, place, and time.  She has weakness with dorsiflexion left foot and mild weakness with plantar flexion. She has some weakness with left knee extension and hip flexion left side compared to right. 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 -Continue low-dose tramadol as needed at night for osteoarthritis pains  Eulas Post MD [facility]

## 2017-09-19 ENCOUNTER — Other Ambulatory Visit: Payer: Self-pay

## 2017-10-12 ENCOUNTER — Other Ambulatory Visit: Payer: Self-pay | Admitting: Family Medicine

## 2017-10-12 NOTE — Telephone Encounter (Signed)
Refills OK. 

## 2017-10-12 NOTE — Telephone Encounter (Unsigned)
Copied from [place] [id number] [id number]. Topic: Quick Communication - See Telephone Encounter >> Oct 12, 2017 11:13 AM Hewitt Shorts wrote: Pt is needing a refill on her tramadol  Best number [phone]  [facility]

## 2017-10-12 NOTE — Telephone Encounter (Signed)
tramadol refill Last Refill:04/05/17 # 90 tab 1 RF Last OV: 04/20/17 PCP: Dr. Elease Hashimoto Pharmacy:[facility]

## 2017-10-13 MED ORDER — TRAMADOL HCL 50 MG PO TABS: 50.0000 mg | ORAL_TABLET | Freq: Two times a day (BID) | ORAL | 1 refills | Status: DC | PRN

## 2017-12-16 DIAGNOSIS — Z23 Encounter for immunization: Secondary | ICD-10-CM | POA: Diagnosis not present

## 2017-12-30 ENCOUNTER — Other Ambulatory Visit: Payer: Self-pay

## 2018-04-10 ENCOUNTER — Other Ambulatory Visit: Payer: Self-pay | Admitting: Family Medicine

## 2018-04-10 NOTE — Telephone Encounter (Signed)
Copied from [place] [id number]. Topic: Quick Communication - Rx Refill/Question >> Apr 10, 2018 12:48 PM Reyne Dumas L wrote: Medication: traMADol (ULTRAM) 50 MG tablet  Has the patient contacted their pharmacy? Yes - states they need RX (Agent: If no, request that the patient contact the pharmacy for the refill.) (Agent: If yes, when and what did the pharmacy advise?)  Preferred Pharmacy (with phone number or street name): [place], [place] [phone] (Phone) [phone] (Fax)  Agent: Please be advised that RX refills may take up to 3 business days. We ask that you follow-up with your pharmacy.

## 2018-04-11 MED ORDER — TRAMADOL HCL 50 MG PO TABS: 50.0000 mg | ORAL_TABLET | Freq: Two times a day (BID) | ORAL | 0 refills | Status: DC | PRN

## 2018-04-11 NOTE — Telephone Encounter (Signed)
Refill once.  Needs office follow within next month.

## 2018-04-11 NOTE — Telephone Encounter (Signed)
Last OV 04/20/17, No future OV  Last filled 10/13/17, #90 with 1 refill  OK to fill

## 2018-06-01 ENCOUNTER — Telehealth: Payer: Self-pay | Admitting: *Deleted

## 2018-06-01 NOTE — Telephone Encounter (Signed)
Patient's daughter called the office and stated that they need an order stating that patient needs an in home care aid. Patient currently has someone that works with patient assisting with personal care, etc. [name] stated that they needed it for tax purposes for 2020. Please advise. [name] can be reached at: [phone].

## 2018-06-02 ENCOUNTER — Ambulatory Visit (INDEPENDENT_AMBULATORY_CARE_PROVIDER_SITE_OTHER): Payer: Medicare Other | Admitting: Family Medicine

## 2018-06-02 ENCOUNTER — Other Ambulatory Visit: Payer: Self-pay

## 2018-06-02 DIAGNOSIS — R29898 Other symptoms and signs involving the musculoskeletal system: Secondary | ICD-10-CM | POA: Diagnosis not present

## 2018-06-02 DIAGNOSIS — M159 Polyosteoarthritis, unspecified: Secondary | ICD-10-CM

## 2018-06-02 DIAGNOSIS — M150 Primary generalized (osteo)arthritis: Secondary | ICD-10-CM | POA: Diagnosis not present

## 2018-06-02 NOTE — Telephone Encounter (Signed)
Patients daughter [name] called back and she has an appointment today at 2pm. Do you need to see the patient or can you just go over patient with daughter [name]? [name] said she could drive over to her house if you need to see her. Please advise.

## 2018-06-02 NOTE — Progress Notes (Signed)
Patient ID: [name], female   DOB: 1927-10-22, 85 y.o.   MRN: [id number]  Virtual Visit via Video Note  I connected with [name] on 06/02/18 at  2:00 PM EDT by a video enabled telemedicine application and verified that I am speaking with the correct person using two identifiers. This visit was set up by her daughter for consultation and meeting occurred with [name] ("[place].   Location patient: home Location provider:work or home office Persons participating in the virtual visit: patient, provider  I discussed the limitations of evaluation and management by telemedicine and the availability of in person appointments. The patient expressed understanding and agreed to proceed.   HPI: Consultation with her daughter Josephina Shih").  They are requesting letter to state that she needs in-home care.  She has chronic problems including osteoarthritis involving multiple joints, GERD, history of goiter, hyperlipidemia, chronic urinary incontinence, severe left lower extremity weakness which she has had for over 40 years.  Patient has had in-home care aide for quite some time.  She needs assistance with multiple activities of daily living including meal preparation, house cleaning, bathing, dressing, and transfers.  Patient has history of chronic incontinence.  She uses Depends at night.  There have been no reports of any recent decubiti.  She uses a walker for ambulation but is having increasing difficulties even with that.  No recent reported falls.   ROS: See pertinent positives and negatives per HPI.  Past Medical History:  Diagnosis Date  . BACK PAIN, LUMBAR, CHRONIC 02/20/2010  . Cardiomegaly 03/16/2010  . COLONIC POLYPS 02/20/2010  . DIVERTICULITIS, HX OF 02/20/2010  . GERD 02/20/2010  . GOITER 02/20/2010  . HYPERLIPIDEMIA 02/20/2010  . OSTEOARTHRITIS, MULTIPLE JOINTS 02/20/2010  . URINARY INCONTINENCE 02/20/2010    Past Surgical History:  Procedure Laterality Date  .  ABDOMINAL HYSTERECTOMY    . NOSE SURGERY  1975   head surgery to repair cracked bone over nose   . SPINE SURGERY     4 back surgeries  . THYROIDECTOMY, PARTIAL     ? goiter, no CA  . TONSILLECTOMY  1936  . TOTAL SHOULDER ARTHROPLASTY  07/30/2011   Procedure: TOTAL SHOULDER ARTHROPLASTY;  Surgeon: Augustin Schooling, MD;  Location: [place];  Service: Orthopedics;  Laterality: Right;  Right Reversed Total Shoulder    Family History  Problem Relation Age of Onset  . Cancer Mother        ovarian  . Hyperlipidemia Mother   . Diabetes Mother        type ll    SOCIAL HX: She is widowed.  She lives alone but has an aide coming about 40 hours/week as above.  Non-smoker.  No alcohol use.   Current Outpatient Medications:  .  aspirin 81 MG tablet, Take 81 mg by mouth daily., Disp: , Rfl:  .  diphenhydramine-acetaminophen (TYLENOL PM EXTRA STRENGTH) 25-500 MG TABS tablet, Take 1 tablet by mouth at bedtime as needed. Takes 3 tablets at night., Disp: , Rfl:  .  Docusate Calcium (STOOL SOFTENER PO), Take by mouth., Disp: , Rfl:  .  fish oil-omega-3 fatty acids 1000 MG capsule, Take 1 g by mouth 2 (two) times daily., Disp: , Rfl:  .  hydrochlorothiazide (HYDRODIURIL) 25 MG tablet, TAKE 1 TABLET AS NEEDED FOR FEET AND ANKLE SWELLING (NEED APPOINTMENT) (Patient taking differently: TAKE 1 TABLET AS NEEDED FOR FEET AND ANKLE SWELLING), Disp: 90 tablet, Rfl: 0 .  MELATONIN PO, Take 5 mg by mouth at  bedtime., Disp: , Rfl:  .  Multiple Vitamin (MULITIVITAMIN WITH MINERALS) TABS, Take 1 tablet by mouth daily., Disp: , Rfl:  .  traMADol (ULTRAM) 50 MG tablet, Take 1 tablet (50 mg total) by mouth every 12 (twelve) hours as needed., Disp: 90 tablet, Rfl: 0  EXAM:  VITALS per patient if applicable:  GENERAL: alert, oriented, appears well and in no acute distress  HEENT: atraumatic, conjunttiva clear, no obvious abnormalities on inspection of external nose and ears  NECK: normal movements of the head and  neck  LUNGS: on inspection no signs of respiratory distress, breathing rate appears normal, no obvious gross SOB, gasping or wheezing  CV: no obvious cyanosis  MS: moves all visible extremities without noticeable abnormality  PSYCH/NEURO: pleasant and cooperative, no obvious depression or anxiety, speech and thought processing grossly intact  ASSESSMENT AND PLAN:  Discussed the following assessment and plan:  #1 chronic left lower extremity weakness following back surgery years ago.  Decreased ambulation and need for assistance with multiple ADLs.   -20 minutes spent with daughter discussing her needs -letter generated expressing/verifying her need for assistance with multiple ADLs.    #2 osteoarthritis involving multiple joints     I discussed the assessment and treatment plan with the patient. The patient was provided an opportunity to ask questions and all were answered. The patient agreed with the plan and demonstrated an understanding of the instructions.   The patient was advised to call back or seek an in-person evaluation if the symptoms worsen or if the condition fails to improve as anticipated.   Carolann Littler, MD

## 2018-06-02 NOTE — Telephone Encounter (Signed)
I think we can go over with daughter.

## 2018-06-02 NOTE — Telephone Encounter (Signed)
Called patient and LMOVM to return call  [place] for [facility] to Discuss results / PCP / recommendations / Schedule patient  Called [name] and left a voice message to let her know that there are things we need to go over for her order. Patient will need an updated Doxy.me visit with Dr. Elease Hashimoto since it has been over a year since patient was seen and need to update for order.  CRM Created.

## 2018-07-11 ENCOUNTER — Telehealth: Payer: Self-pay | Admitting: Family Medicine

## 2018-07-11 MED ORDER — TRAMADOL HCL 50 MG PO TABS: 50.0000 mg | ORAL_TABLET | Freq: Two times a day (BID) | ORAL | 0 refills | Status: DC | PRN

## 2018-07-11 NOTE — Telephone Encounter (Signed)
Last OV 06/02/18, No future OV  Last filled 04/11/18, # 90 with 0 refills

## 2018-07-11 NOTE — Telephone Encounter (Signed)
Copied from [place] [id number]. Topic: Quick Communication - Rx Refill/Question >> Jul 11, 2018  3:12 PM Rayann Heman wrote: Medication:traMADol Veatrice Bourbon) 50 MG tablet [[id number]  Has the patient contacted their pharmacy? no Preferred Pharmacy (with phone number or street name): [place], [place] [phone] (Phone) [phone] (Fax)    Agent: Please be advised that RX refills may take up to 3 business days. We ask that you follow-up with your pharmacy.

## 2018-08-15 ENCOUNTER — Telehealth: Payer: Self-pay

## 2018-08-15 NOTE — Telephone Encounter (Signed)
Patient has an in office appointment on Monday, July 7th at 3pm.

## 2018-08-15 NOTE — Telephone Encounter (Signed)
Set up Doxy or In Office as long as neg covid screen?  Copied from [place] [id number]. Topic: Appointment Scheduling - Scheduling Inquiry for Clinic >> Aug 14, 2018  4:11 PM Celene Kras A wrote: Reason for CRM: Pts daughter called stating pt has a spot between shin and calf muscle. Pts daughter states it is tender to the touch. Requesting appt. Please advise.

## 2018-08-15 NOTE — Telephone Encounter (Signed)
OK to do appt.

## 2018-08-21 ENCOUNTER — Ambulatory Visit (INDEPENDENT_AMBULATORY_CARE_PROVIDER_SITE_OTHER): Payer: Medicare Other | Admitting: Family Medicine

## 2018-08-21 ENCOUNTER — Encounter: Payer: Self-pay | Admitting: Family Medicine

## 2018-08-21 ENCOUNTER — Other Ambulatory Visit: Payer: Self-pay

## 2018-08-21 ENCOUNTER — Ambulatory Visit (INDEPENDENT_AMBULATORY_CARE_PROVIDER_SITE_OTHER): Payer: Medicare Other

## 2018-08-21 VITALS — BP 110/72 | HR 85 | Temp 98.5°F | Wt 159.4 lb

## 2018-08-21 DIAGNOSIS — M62838 Other muscle spasm: Secondary | ICD-10-CM | POA: Diagnosis not present

## 2018-08-21 DIAGNOSIS — M11262 Other chondrocalcinosis, left knee: Secondary | ICD-10-CM | POA: Diagnosis not present

## 2018-08-21 DIAGNOSIS — M159 Polyosteoarthritis, unspecified: Secondary | ICD-10-CM

## 2018-08-21 DIAGNOSIS — M150 Primary generalized (osteo)arthritis: Secondary | ICD-10-CM

## 2018-08-21 DIAGNOSIS — M79605 Pain in left leg: Secondary | ICD-10-CM

## 2018-08-21 MED ORDER — BACLOFEN 10 MG PO TABS: 10.0000 mg | ORAL_TABLET | Freq: Two times a day (BID) | ORAL | 1 refills | Status: DC | PRN

## 2018-08-21 NOTE — Progress Notes (Signed)
Subjective:     Patient ID: [name], female   DOB: 05/05/1927, 85 y.o.   MRN: [id number]  HPI   Left lateral leg pain which has gone on for over a year.  Denies any injury.  She has some pain.  No numbness.  She has foot drop on the left which is chronic and has been present for several years.  No pain with weightbearing.  No calf pain.  No redness.  Has not tried any icing  She also complains of bilateral leg muscle "spasms".  Patient does not think these represent localized muscle cramps.  She states these occur more at night.  Interfering with sleep.  She denies any restless leg symptoms.  Denies any chronic back pain  She has osteoarthritis involving several joints.  Uses some Tylenol and also tramadol.  Overall stable  Past Medical History:  Diagnosis Date  . BACK PAIN, LUMBAR, CHRONIC 02/20/2010  . Cardiomegaly 03/16/2010  . COLONIC POLYPS 02/20/2010  . DIVERTICULITIS, HX OF 02/20/2010  . GERD 02/20/2010  . GOITER 02/20/2010  . HYPERLIPIDEMIA 02/20/2010  . OSTEOARTHRITIS, MULTIPLE JOINTS 02/20/2010  . URINARY INCONTINENCE 02/20/2010   Past Surgical History:  Procedure Laterality Date  . ABDOMINAL HYSTERECTOMY    . NOSE SURGERY  1975   head surgery to repair cracked bone over nose   . SPINE SURGERY     4 back surgeries  . THYROIDECTOMY, PARTIAL     ? goiter, no CA  . TONSILLECTOMY  1936  . TOTAL SHOULDER ARTHROPLASTY  07/30/2011   Procedure: TOTAL SHOULDER ARTHROPLASTY;  Surgeon: Augustin Schooling, MD;  Location: [place];  Service: Orthopedics;  Laterality: Right;  Right Reversed Total Shoulder    reports that she has never smoked. She has never used smokeless tobacco. She reports that she does not drink alcohol or use drugs. family history includes Cancer in her mother; Diabetes in her mother; Hyperlipidemia in her mother. No Known Allergies   Review of Systems  Constitutional: Negative for appetite change, chills, fever and unexpected weight change.  Respiratory: Negative  for cough and shortness of breath.   Cardiovascular: Negative for chest pain.  Gastrointestinal: Negative for abdominal pain.  Genitourinary: Negative for dysuria.  Musculoskeletal: Positive for arthralgias.  Neurological: Negative for numbness.  Hematological: Negative for adenopathy.  Psychiatric/Behavioral: Negative for confusion.       Objective:   Physical Exam Constitutional:      Appearance: Normal appearance.  Cardiovascular:     Rate and Rhythm: Normal rate and regular rhythm.  Pulmonary:     Effort: Pulmonary effort is normal.     Breath sounds: Normal breath sounds.  Musculoskeletal:     Comments: Right and left leg reveal no pitting edema.  No ecchymosis.  No warmth.  No erythema.  She has some mild tenderness to palpation of the left lateral leg muscles  No significant varicosities  Neurological:     Mental Status: She is alert.     Comments: She has weakness with dorsiflexion on the left which is chronic.        Assessment:     #1 left lateral leg pain.  Chronic for several months.  Nonfocal exam.  No leg soft tissue masses palpated  #2 intermittent lower extremity muscle spasms at night  #3 osteoarthritis involving multiple joints-stable   Plan:     -Given duration of left leg pain we obtained left tib-fib films.  She has some linear calcification in the knee (no known  hx of pseudogout).. No acute bony abnormalities and this will be over read  -We discussed generally avoiding muscle relaxers given her age and high risk of side effects.  We did agree to low-dose baclofen 10 mg nightly and reviewed potential side effects  -We recommend she try some icing 15 to 20 minutes 2 times daily to left leg and consider low compression to see if this helps  -Continue tramadol as needed for osteoarthritis pains  Eulas Post MD [facility] at [facility]

## 2018-08-21 NOTE — Patient Instructions (Addendum)
Consider trial of icing 15-20 minutes two to three times daily  Consider compression socks with mild compression.

## 2018-10-09 ENCOUNTER — Telehealth: Payer: Self-pay | Admitting: Family Medicine

## 2018-10-09 MED ORDER — TRAMADOL HCL 50 MG PO TABS: 50.0000 mg | ORAL_TABLET | Freq: Two times a day (BID) | ORAL | 0 refills | Status: DC | PRN

## 2018-10-09 NOTE — Telephone Encounter (Signed)
Last OV 08/21/18, No future OV  Last filled 07/11/18, # 90 with 0 refills

## 2018-10-09 NOTE — Telephone Encounter (Signed)
Pt is requesting a refill for traMADol (ULTRAM) 50 MG tablet   Pharmacy: [place], [place]

## 2018-11-14 ENCOUNTER — Ambulatory Visit (INDEPENDENT_AMBULATORY_CARE_PROVIDER_SITE_OTHER): Payer: Medicare Other | Admitting: Family Medicine

## 2018-11-14 ENCOUNTER — Encounter: Payer: Self-pay | Admitting: Family Medicine

## 2018-11-14 ENCOUNTER — Ambulatory Visit (HOSPITAL_COMMUNITY)
Admission: RE | Admit: 2018-11-14 | Discharge: 2018-11-14 | Disposition: A | Discharge status: Home / Self Care | Payer: Medicare Other | Source: Ambulatory Visit | Attending: Family Medicine | Admitting: Family Medicine

## 2018-11-14 ENCOUNTER — Other Ambulatory Visit: Payer: Self-pay

## 2018-11-14 VITALS — BP 130/68 | HR 100 | Temp 98.4°F

## 2018-11-14 DIAGNOSIS — R0689 Other abnormalities of breathing: Secondary | ICD-10-CM

## 2018-11-14 DIAGNOSIS — R7989 Other specified abnormal findings of blood chemistry: Secondary | ICD-10-CM | POA: Diagnosis not present

## 2018-11-14 DIAGNOSIS — R0600 Dyspnea, unspecified: Secondary | ICD-10-CM

## 2018-11-14 DIAGNOSIS — R0989 Other specified symptoms and signs involving the circulatory and respiratory systems: Secondary | ICD-10-CM

## 2018-11-14 DIAGNOSIS — R0781 Pleurodynia: Secondary | ICD-10-CM

## 2018-11-14 DIAGNOSIS — R079 Chest pain, unspecified: Secondary | ICD-10-CM | POA: Diagnosis not present

## 2018-11-14 NOTE — Progress Notes (Addendum)
Subjective:     Patient ID: [name], female   DOB: Feb 20, 1927, 85 y.o.   MRN: [id number]  HPI   Patient states she woke up yesterday morning with some pain just lateral and inferior to the left breast region.  Denies any injury.  She was able to sleep okay last night.  She does not have any significant radiation of pain.  She has not noted any skin rashes.  She states it is painful slightly to take a deep breath on that side.  She denies any new cough no fever.  Has some mild dyspnea with activity but not particularly at rest.    No history of DVT or PE.  Denies any recent lower extremity swelling or pain.  Pain is not exacerbated by movement.  Her chronic problems include history of osteoarthritis, hyperlipidemia, chronic lumbar back pain She has had evidence for hiatal hernia on previous chest x-ray several years ago.  No recent imaging.  Past Medical History:  Diagnosis Date  . BACK PAIN, LUMBAR, CHRONIC 02/20/2010  . Cardiomegaly 03/16/2010  . COLONIC POLYPS 02/20/2010  . DIVERTICULITIS, HX OF 02/20/2010  . GERD 02/20/2010  . GOITER 02/20/2010  . HYPERLIPIDEMIA 02/20/2010  . OSTEOARTHRITIS, MULTIPLE JOINTS 02/20/2010  . URINARY INCONTINENCE 02/20/2010   Past Surgical History:  Procedure Laterality Date  . ABDOMINAL HYSTERECTOMY    . NOSE SURGERY  1975   head surgery to repair cracked bone over nose   . SPINE SURGERY     4 back surgeries  . THYROIDECTOMY, PARTIAL     ? goiter, no CA  . TONSILLECTOMY  1936  . TOTAL SHOULDER ARTHROPLASTY  07/30/2011   Procedure: TOTAL SHOULDER ARTHROPLASTY;  Surgeon: Augustin Schooling, MD;  Location: [place];  Service: Orthopedics;  Laterality: Right;  Right Reversed Total Shoulder    reports that she has never smoked. She has never used smokeless tobacco. She reports that she does not drink alcohol or use drugs. family history includes Cancer in her mother; Diabetes in her mother; Hyperlipidemia in her mother. No Known Allergies   Review of Systems   Constitutional: Negative for chills and fever.  Respiratory: Negative for cough, shortness of breath and wheezing.   Cardiovascular: Negative for palpitations and leg swelling.  Gastrointestinal: Negative for abdominal pain.  Neurological: Negative for dizziness.  Psychiatric/Behavioral: Negative for confusion.       Objective:   Physical Exam Constitutional:      Appearance: Normal appearance.  Cardiovascular:     Rate and Rhythm: Normal rate and regular rhythm.  Pulmonary:     Effort: Pulmonary effort is normal.     Comments: She has some crackles in both bases and somewhat poor aeration in both bases.  No wheezing Abdominal:     Palpations: Abdomen is soft.  Musculoskeletal:     Right lower leg: No edema.     Left lower leg: No edema.     Comments: No reproducible tenderness left chest wall  Skin:    Findings: No rash.     Comments: No visible rashes in the back or trunk region  Neurological:     Mental Status: She is alert.        Assessment:     1 day history of left-sided chest pain which is lateral/peripheral in location.  No evidence for shingles rash.  She does describe some pain with deep breathing but is in no respiratory distress.  Pulse oximetry 93%.  She is not describing any likely  cardiac pain.    Plan:     -Obtain PA and lateral chest x-ray -Check labs with CBC, comprehensive metabolic panel, d-dimer, BNP -go to ER for any increased dyspnea, worsening pain, or other concerns.  Eulas Post MD [facility]  Positive d-dimer with level 3.55.  Daughter states she is stable symptomatically.  We get CT angiogram of chest today to further evaluate  Eulas Post MD [facility] request for bedside commode and transport chair from family.  (1) Patient requires a transport chair due to (chronic left leg weakness R29.898).  . Patient has a mobility limitation that prevents them from  completing ADL's. . Patient is unable to safely ambulate 135ft with an assistive device (rolling walker, cane or crutch).  . Patient is unable to self-propel a wheelchair and requires a transport chair. Patient has caregiver who is able, willing, and available to provide assistance with the transport chair.  Eulas Post MD [facility] at [facility]

## 2018-11-15 ENCOUNTER — Ambulatory Visit
Admission: RE | Admit: 2018-11-15 | Discharge: 2018-11-15 | Disposition: A | Discharge status: Home / Self Care | Payer: Medicare Other | Source: Ambulatory Visit | Attending: Family Medicine | Admitting: Family Medicine

## 2018-11-15 ENCOUNTER — Telehealth: Payer: Self-pay

## 2018-11-15 DIAGNOSIS — R7989 Other specified abnormal findings of blood chemistry: Secondary | ICD-10-CM

## 2018-11-15 DIAGNOSIS — R0781 Pleurodynia: Secondary | ICD-10-CM

## 2018-11-15 DIAGNOSIS — I2693 Single subsegmental pulmonary embolism without acute cor pulmonale: Secondary | ICD-10-CM | POA: Diagnosis not present

## 2018-11-15 LAB — CBC WITH DIFFERENTIAL/PLATELET
Basophils Absolute: 0.1 10*3/uL (ref 0.0–0.1)
Basophils Relative: 1 % (ref 0.0–3.0)
Eosinophils Absolute: 0 10*3/uL (ref 0.0–0.7)
Eosinophils Relative: 0.1 % (ref 0.0–5.0)
HCT: 44 % (ref 36.0–46.0)
Hemoglobin: 14.5 g/dL (ref 12.0–15.0)
Lymphocytes Relative: 15.7 % (ref 12.0–46.0)
Lymphs Abs: 1.3 10*3/uL (ref 0.7–4.0)
MCHC: 33.1 g/dL (ref 30.0–36.0)
MCV: 87.1 fl (ref 78.0–100.0)
Monocytes Absolute: 0.8 10*3/uL (ref 0.1–1.0)
Monocytes Relative: 10.1 % (ref 3.0–12.0)
Neutro Abs: 6 10*3/uL (ref 1.4–7.7)
Neutrophils Relative %: 73.1 % (ref 43.0–77.0)
Platelets: 278 10*3/uL (ref 150.0–400.0)
RBC: 5.05 Mil/uL (ref 3.87–5.11)
RDW: 14.6 % (ref 11.5–15.5)
WBC: 8.2 10*3/uL (ref 4.0–10.5)

## 2018-11-15 LAB — COMPREHENSIVE METABOLIC PANEL
ALT: 17 U/L (ref 0–35)
AST: 21 U/L (ref 0–37)
Albumin: 4.2 g/dL (ref 3.5–5.2)
Alkaline Phosphatase: 59 U/L (ref 39–117)
BUN: 13 mg/dL (ref 6–23)
CO2: 26 mEq/L (ref 19–32)
Calcium: 9.6 mg/dL (ref 8.4–10.5)
Chloride: 100 mEq/L (ref 96–112)
Creatinine, Ser: 0.78 mg/dL (ref 0.40–1.20)
GFR: 69.53 mL/min (ref >60.00)
Glucose, Bld: 133 mg/dL — ABNORMAL HIGH (ref 70–99)
Potassium: 4.3 mEq/L (ref 3.5–5.1)
Sodium: 135 mEq/L (ref 135–145)
Total Bilirubin: 0.5 mg/dL (ref 0.2–1.2)
Total Protein: 7 g/dL (ref 6.0–8.3)

## 2018-11-15 LAB — D-DIMER, QUANTITATIVE: D-Dimer, Quant: 3.55 mcg/mL FEU — ABNORMAL HIGH (ref <0.50)

## 2018-11-15 LAB — BRAIN NATRIURETIC PEPTIDE: Pro B Natriuretic peptide (BNP): 89 pg/mL (ref 0.0–100.0)

## 2018-11-15 MED ORDER — IOPAMIDOL (ISOVUE-370) INJECTION 76%
75.0000 mL | Freq: Once | INTRAVENOUS | Status: AC | PRN
  Administered 2018-11-15: 14:00:00 75 mL via INTRAVENOUS

## 2018-11-15 MED ORDER — APIXABAN 5 MG PO TABS: ORAL_TABLET | ORAL | 2 refills | Status: DC

## 2018-11-15 NOTE — Telephone Encounter (Signed)
I have notified daughter Josephina Shih) and have started Eliquis 10 mg bid for 7 days and then 5 mg po bid

## 2018-11-15 NOTE — Addendum Note (Signed)
Addended by: Eulas Post on: 11/15/2018 09:11 AM   Modules accepted: Orders

## 2018-11-15 NOTE — Telephone Encounter (Signed)
received critical results: Scattered small pulmonary emboli in the right lung. No heart strain is noticed. mild left lower lobe compressive atelecpasis that's related to a large hiatal hernia.

## 2018-11-15 NOTE — Addendum Note (Signed)
Addended by: Eulas Post on: 11/15/2018 05:40 PM   Modules accepted: Orders

## 2018-11-16 ENCOUNTER — Telehealth: Payer: Self-pay | Admitting: Family Medicine

## 2018-11-16 DIAGNOSIS — R7981 Abnormal blood-gas level: Secondary | ICD-10-CM

## 2018-11-16 NOTE — Telephone Encounter (Signed)
Forwarding note to PCP. Do we need to evaluate her or order hh to evaluate for O2 needs

## 2018-11-16 NOTE — Telephone Encounter (Signed)
Pt's daughter called in to request to have assistance from provider. Daughter says that the paramedics had to come out to the pt's home and suggested that she get oxygen. Daughter says that pt fell and was advised to go to the hospital but pt says that she doesn't feel that she need to. EMS was able to get her oxygen level back up. Pt/daughter says that a nurse is not needed. Declined NT. Advised that I will send message to provider and to call us back if further assistance is needed.    [id number]:[phone] - daughter is

## 2018-11-17 ENCOUNTER — Telehealth: Payer: Self-pay | Admitting: Family Medicine

## 2018-11-17 DIAGNOSIS — R29898 Other symptoms and signs involving the musculoskeletal system: Secondary | ICD-10-CM

## 2018-11-17 NOTE — Telephone Encounter (Signed)
Patient's sister called in stating she would also like to do [facility] [employer] if [facility] is not available, as this is closer to her. Please advise. Call back is [phone].

## 2018-11-17 NOTE — Telephone Encounter (Signed)
Yes.  Will have to get documentation of O2 sat < 88%. Let's get HH out and can do overnight pulse oximetry.

## 2018-11-17 NOTE — Telephone Encounter (Signed)
Order placed for [facility] per PCP for overnight O2 saturation monitoring

## 2018-11-17 NOTE — Telephone Encounter (Signed)
[name],Michelle "[name]" requesting orders for bedside commode and transport wheelchair, please send orders to [facility] in [place] / [employer] location. [name] would like a follow up when orders are sent in today.

## 2018-11-20 ENCOUNTER — Other Ambulatory Visit: Payer: Self-pay | Admitting: Family Medicine

## 2018-11-20 NOTE — Telephone Encounter (Signed)
Ok for orders? 

## 2018-11-20 NOTE — Telephone Encounter (Signed)
OK to order requested items.

## 2018-11-21 DIAGNOSIS — E785 Hyperlipidemia, unspecified: Secondary | ICD-10-CM | POA: Diagnosis not present

## 2018-11-21 DIAGNOSIS — L89152 Pressure ulcer of sacral region, stage 2: Secondary | ICD-10-CM | POA: Diagnosis not present

## 2018-11-21 DIAGNOSIS — I517 Cardiomegaly: Secondary | ICD-10-CM | POA: Diagnosis not present

## 2018-11-21 DIAGNOSIS — K5730 Diverticulosis of large intestine without perforation or abscess without bleeding: Secondary | ICD-10-CM | POA: Diagnosis not present

## 2018-11-21 DIAGNOSIS — M159 Polyosteoarthritis, unspecified: Secondary | ICD-10-CM | POA: Diagnosis not present

## 2018-11-21 DIAGNOSIS — M62830 Muscle spasm of back: Secondary | ICD-10-CM | POA: Diagnosis not present

## 2018-11-21 DIAGNOSIS — E049 Nontoxic goiter, unspecified: Secondary | ICD-10-CM | POA: Diagnosis not present

## 2018-11-21 DIAGNOSIS — Z7901 Long term (current) use of anticoagulants: Secondary | ICD-10-CM | POA: Diagnosis not present

## 2018-11-21 DIAGNOSIS — K219 Gastro-esophageal reflux disease without esophagitis: Secondary | ICD-10-CM | POA: Diagnosis not present

## 2018-11-21 DIAGNOSIS — R0902 Hypoxemia: Secondary | ICD-10-CM | POA: Diagnosis not present

## 2018-11-21 NOTE — Telephone Encounter (Signed)
Rx done. 

## 2018-11-21 NOTE — Telephone Encounter (Signed)
Refill with one additional refill. 

## 2018-11-21 NOTE — Telephone Encounter (Signed)
Last refill given on 7/16 for #60 with 1 ref

## 2018-11-22 NOTE — Telephone Encounter (Signed)
Orders have been placed.

## 2018-11-24 DIAGNOSIS — R7981 Abnormal blood-gas level: Secondary | ICD-10-CM | POA: Diagnosis not present

## 2018-11-27 ENCOUNTER — Other Ambulatory Visit: Payer: Self-pay | Admitting: Family Medicine

## 2018-11-28 ENCOUNTER — Telehealth: Payer: Self-pay | Admitting: Family Medicine

## 2018-11-28 DIAGNOSIS — K5730 Diverticulosis of large intestine without perforation or abscess without bleeding: Secondary | ICD-10-CM | POA: Diagnosis not present

## 2018-11-28 DIAGNOSIS — R0902 Hypoxemia: Secondary | ICD-10-CM | POA: Diagnosis not present

## 2018-11-28 DIAGNOSIS — M62830 Muscle spasm of back: Secondary | ICD-10-CM | POA: Diagnosis not present

## 2018-11-28 DIAGNOSIS — I517 Cardiomegaly: Secondary | ICD-10-CM | POA: Diagnosis not present

## 2018-11-28 DIAGNOSIS — K219 Gastro-esophageal reflux disease without esophagitis: Secondary | ICD-10-CM | POA: Diagnosis not present

## 2018-11-28 DIAGNOSIS — L89152 Pressure ulcer of sacral region, stage 2: Secondary | ICD-10-CM | POA: Diagnosis not present

## 2018-11-28 NOTE — Telephone Encounter (Signed)
Home Health Verbal Orders - Caller/Agency: [facility] Number: [phone] Requesting OT/PT/Skilled Nursing/Social Work/Speech Therapy: PT Frequency: evaluation, 1 week 1

## 2018-11-28 NOTE — Telephone Encounter (Signed)
OK to give verbal orders as requested.

## 2018-11-29 ENCOUNTER — Telehealth: Payer: Self-pay

## 2018-11-29 ENCOUNTER — Encounter: Payer: Self-pay | Admitting: Family Medicine

## 2018-11-29 NOTE — Telephone Encounter (Signed)
Called and gave verbal orders. Nothing further is needed

## 2018-11-29 NOTE — Telephone Encounter (Signed)
Should be 2 liter per minute.  Some tape had pulled away the ink.  We re-filled in the blank.

## 2018-11-29 NOTE — Telephone Encounter (Signed)
Jermayn with [employer] called and stated that the Oxgen flow spot on the form is blank.  He stated that has to be filled out and faxed back over.  He stated he also needs a note basic on her overnight cemetery. His number [phone]

## 2018-11-29 NOTE — Telephone Encounter (Signed)
Dr. Elease Hashimoto completed form for pt to obtain oxygen. This form was faxed to [phone] on 11/29/18

## 2018-12-01 ENCOUNTER — Telehealth: Payer: Self-pay | Admitting: Family Medicine
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
## 2018-12-01 NOTE — Telephone Encounter (Signed)
Please refill if appropriate
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
## 2018-12-04 NOTE — Telephone Encounter (Signed)
Attempted to reach daughter (on [place]) no answer. Left vm to call back

## 2018-12-04 NOTE — Telephone Encounter (Signed)
The updated form was faxed last week with the correct liters

## 2018-12-04 NOTE — Telephone Encounter (Signed)
I don't think it is absolutely necessary as long as she is getting along OK with basic daily activities.  I suspect her O2 sats will improve some as she remains on anti-coagulant- as the blood clots eventually break down.
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
## 2018-12-05 DIAGNOSIS — R0902 Hypoxemia: Secondary | ICD-10-CM | POA: Diagnosis not present

## 2018-12-05 DIAGNOSIS — K219 Gastro-esophageal reflux disease without esophagitis: Secondary | ICD-10-CM | POA: Diagnosis not present

## 2018-12-05 DIAGNOSIS — M62830 Muscle spasm of back: Secondary | ICD-10-CM | POA: Diagnosis not present

## 2018-12-05 DIAGNOSIS — K5730 Diverticulosis of large intestine without perforation or abscess without bleeding: Secondary | ICD-10-CM | POA: Diagnosis not present

## 2018-12-05 DIAGNOSIS — I517 Cardiomegaly: Secondary | ICD-10-CM | POA: Diagnosis not present

## 2018-12-05 DIAGNOSIS — L89152 Pressure ulcer of sacral region, stage 2: Secondary | ICD-10-CM | POA: Diagnosis not present

## 2018-12-06 ENCOUNTER — Telehealth: Payer: Self-pay | Admitting: Family Medicine

## 2018-12-06 NOTE — Telephone Encounter (Signed)
Christy with [facility] calling to get the okay to file a stage 2 pressure ulcer to the sacrum with the code L89.152 Please advise.  CB#: [phone] (can leave a voicemail)

## 2018-12-06 NOTE — Telephone Encounter (Signed)
OK 

## 2018-12-07 ENCOUNTER — Ambulatory Visit: Payer: Self-pay

## 2018-12-07 MED ORDER — HYDROCHLOROTHIAZIDE 25 MG PO TABS: ORAL_TABLET | ORAL | 0 refills | Status: DC

## 2018-12-07 NOTE — Telephone Encounter (Signed)
Start taking HCTZ and follow up with Dr. Elease Hashimoto

## 2018-12-07 NOTE — Addendum Note (Signed)
Addended by: Agnes Lawrence on: 12/07/2018 05:04 PM   Modules accepted: Orders

## 2018-12-07 NOTE — Telephone Encounter (Signed)
[name], PT, with [facility], is with pt. To do an evaluation. Report BP is 190/100 and then 187/95. Pulse 70, resp. 25, O2 sat 97%. No symptoms. Noticed HCTZ is on medication list, but pt. Has not been taking medication.Will hold evaluation for now. Please advise pt.  Reason for Disposition . Systolic BP  >= 0000000 OR Diastolic >= 123XX123  Answer Assessment - Initial Assessment Questions 1. BLOOD PRESSURE: "What is the blood pressure?" "Did you take at least two measurements 5 minutes apart?"     190/100  Then  187/95 2. ONSET: "When did you take your blood pressure?"     Today 3. HOW: "How did you obtain the blood pressure?" (e.g., visiting nurse, automatic home BP monitor)     PT obtained BP 4. HISTORY: "Do you have a history of high blood pressure?"     No 5. MEDICATIONS: "Are you taking any medications for blood pressure?" "Have you missed any doses recently?"     No 6. OTHER SYMPTOMS: "Do you have any symptoms?" (e.g., headache, chest pain, blurred vision, difficulty breathing, weakness)     No 7. PREGNANCY: "Is there any chance you are pregnant?" "When was your last menstrual period?"     No  Protocols used: HIGH BLOOD PRESSURE-A-AH

## 2018-12-07 NOTE — Telephone Encounter (Signed)
I called the pt and informed her of the message below.  Patient is aware the Rx was sent to her pharmacy as she stated she is out of this.  Patient has an appt on 10/27, I offered an earlier appt and the pt stated she doesn't feel it would make a difference.  Message sent to Dr Elease Hashimoto.

## 2018-12-08 MED ORDER — HYDROCHLOROTHIAZIDE 25 MG PO TABS: ORAL_TABLET | ORAL | 5 refills | Status: DC

## 2018-12-08 NOTE — Telephone Encounter (Signed)
Make sure she has refills of the HCTZ.

## 2018-12-08 NOTE — Telephone Encounter (Signed)
Rx re-sent with refills.

## 2018-12-08 NOTE — Telephone Encounter (Signed)
Attempted to reach [facility] no answer. Left vm with information.

## 2018-12-08 NOTE — Addendum Note (Signed)
Addended by: Lahoma Crocker A on: 12/08/2018 08:00 AM   Modules accepted: Orders

## 2018-12-12 ENCOUNTER — Encounter: Payer: Self-pay | Admitting: Family Medicine

## 2018-12-12 ENCOUNTER — Other Ambulatory Visit: Payer: Self-pay

## 2018-12-12 ENCOUNTER — Ambulatory Visit (INDEPENDENT_AMBULATORY_CARE_PROVIDER_SITE_OTHER): Payer: Medicare Other | Admitting: Family Medicine

## 2018-12-12 VITALS — BP 126/82 | HR 91 | Temp 97.4°F

## 2018-12-12 DIAGNOSIS — I2694 Multiple subsegmental pulmonary emboli without acute cor pulmonale: Secondary | ICD-10-CM | POA: Diagnosis not present

## 2018-12-12 DIAGNOSIS — I10 Essential (primary) hypertension: Secondary | ICD-10-CM | POA: Diagnosis not present

## 2018-12-12 DIAGNOSIS — Z23 Encounter for immunization: Secondary | ICD-10-CM | POA: Diagnosis not present

## 2018-12-12 LAB — BASIC METABOLIC PANEL
BUN: 15 mg/dL (ref 6–23)
CO2: 30 mEq/L (ref 19–32)
Calcium: 9.8 mg/dL (ref 8.4–10.5)
Chloride: 91 mEq/L — ABNORMAL LOW (ref 96–112)
Creatinine, Ser: 0.55 mg/dL (ref 0.40–1.20)
GFR: 104.04 mL/min (ref >60.00)
Glucose, Bld: 101 mg/dL — ABNORMAL HIGH (ref 70–99)
Potassium: 3.7 mEq/L (ref 3.5–5.1)
Sodium: 130 mEq/L — ABNORMAL LOW (ref 135–145)

## 2018-12-12 MED ORDER — APIXABAN 5 MG PO TABS: ORAL_TABLET | ORAL | 1 refills | Status: DC

## 2018-12-12 MED ORDER — HYDROCHLOROTHIAZIDE 12.5 MG PO CAPS: 12.5000 mg | ORAL_CAPSULE | Freq: Every day | ORAL | 3 refills | Status: DC

## 2018-12-12 NOTE — Progress Notes (Signed)
Subjective:     Patient ID: [name], female   DOB: Feb 10, 1928, 85 y.o.   MRN: [id number]  HPI [name] is here for follow-up regarding recently diagnosed pulmonary emboli.  She is doing well with Eliquis 5 mg twice daily.  She had some initial hypoxia and we tried to get home health to deliver oxygen but they had some problems with the machine.  She is coping fairly well without oxygen at this time.  Her O2 sats have been ranging mostly between 92 and 96%.  No dyspnea at rest.  No pleuritic pain.  No chest pain.  No bleeding complications.  She actually had pulmonary emboli following shoulder replacement surgery 2012 which daughter recalled today.  She is very sedentary which is her major risk factor for blood clots at this time  They noticed her blood pressures been quite sporadic by home readings.  These have unfortunately been taken by wrist cuff so reliability is uncertain.  They have gotten a couple readings as high as 123XX123 systolic.  She is currently on HCTZ 25 mg daily.  She was not taking this regularly until about a month ago.  Past Medical History:  Diagnosis Date  . BACK PAIN, LUMBAR, CHRONIC 02/20/2010  . Cardiomegaly 03/16/2010  . COLONIC POLYPS 02/20/2010  . DIVERTICULITIS, HX OF 02/20/2010  . GERD 02/20/2010  . GOITER 02/20/2010  . HYPERLIPIDEMIA 02/20/2010  . OSTEOARTHRITIS, MULTIPLE JOINTS 02/20/2010  . URINARY INCONTINENCE 02/20/2010   Past Surgical History:  Procedure Laterality Date  . ABDOMINAL HYSTERECTOMY    . NOSE SURGERY  1975   head surgery to repair cracked bone over nose   . SPINE SURGERY     4 back surgeries  . THYROIDECTOMY, PARTIAL     ? goiter, no CA  . TONSILLECTOMY  1936  . TOTAL SHOULDER ARTHROPLASTY  07/30/2011   Procedure: TOTAL SHOULDER ARTHROPLASTY;  Surgeon: Augustin Schooling, MD;  Location: [place];  Service: Orthopedics;  Laterality: Right;  Right Reversed Total Shoulder    reports that she has never smoked. She has never used smokeless  tobacco. She reports that she does not drink alcohol or use drugs. family history includes Cancer in her mother; Diabetes in her mother; Hyperlipidemia in her mother. No Known Allergies   Review of Systems  Constitutional: Negative for appetite change, chills, fatigue, fever and unexpected weight change.  Eyes: Negative for visual disturbance.  Respiratory: Negative for cough, chest tightness, shortness of breath and wheezing.   Cardiovascular: Negative for chest pain, palpitations and leg swelling.  Neurological: Negative for dizziness, seizures, syncope, weakness, light-headedness and headaches.       Objective:   Physical Exam Constitutional:      Appearance: She is well-developed.  Eyes:     Pupils: Pupils are equal, round, and reactive to light.  Neck:     Musculoskeletal: Neck supple.     Thyroid: No thyromegaly.     Vascular: No JVD.  Cardiovascular:     Rate and Rhythm: Normal rate and regular rhythm.     Heart sounds: No gallop.   Pulmonary:     Effort: Pulmonary effort is normal. No respiratory distress.     Breath sounds: No wheezing.     Comments: She has a few crackles in her left base Musculoskeletal:     Right lower leg: No edema.     Left lower leg: No edema.  Neurological:     Mental Status: She is alert.  Assessment:     #1 recent right-sided pulmonary emboli.  Stable symptomatically and on Eliquis.  This will make second event over the past 8 years with first event following surgery of shoulder  #2 hypertension.  Well controlled by today's reading here but elevated by several recent home readings    Plan:     -Refill Eliquis and would consider indefinite therapy given the fact she has had second event for pulmonary emboli and the fact she is very sedentary  -Refill HCTZ 12.5 mg daily and recheck basic metabolic panel.  She had taken this previously just more or less for edema but has now been taking regularly.  Increase potassium rich  foods.  -Flu vaccine given  -Routine follow-up in 6 months and sooner as needed  Eulas Post MD [facility]

## 2018-12-13 ENCOUNTER — Telehealth: Payer: Self-pay | Admitting: Family Medicine

## 2018-12-13 ENCOUNTER — Telehealth: Payer: Self-pay | Admitting: *Deleted

## 2018-12-13 DIAGNOSIS — I517 Cardiomegaly: Secondary | ICD-10-CM | POA: Diagnosis not present

## 2018-12-13 DIAGNOSIS — R0902 Hypoxemia: Secondary | ICD-10-CM | POA: Diagnosis not present

## 2018-12-13 DIAGNOSIS — L89152 Pressure ulcer of sacral region, stage 2: Secondary | ICD-10-CM | POA: Diagnosis not present

## 2018-12-13 DIAGNOSIS — M62830 Muscle spasm of back: Secondary | ICD-10-CM | POA: Diagnosis not present

## 2018-12-13 DIAGNOSIS — K5730 Diverticulosis of large intestine without perforation or abscess without bleeding: Secondary | ICD-10-CM | POA: Diagnosis not present

## 2018-12-13 DIAGNOSIS — K219 Gastro-esophageal reflux disease without esophagitis: Secondary | ICD-10-CM | POA: Diagnosis not present

## 2018-12-13 NOTE — Telephone Encounter (Signed)
Noted thanks °

## 2018-12-13 NOTE — Telephone Encounter (Signed)
Pt's daughter called back according to other telephone encounter. She was notified of lab result

## 2018-12-13 NOTE — Telephone Encounter (Signed)
Attempted to reach patient. No answer. Vm left to call back  

## 2018-12-13 NOTE — Telephone Encounter (Signed)
Pt sister calling back for results. Please advise   Copied from [place] [id number]. Topic: Quick Communication - Lab Results (Clinic Use ONLY) >> Dec 13, 2018 10:29 AM Marin Roberts, [place] wrote: Called patient to inform them of 12/12/18 lab results. When patient returns call, triage nurse may disclose results.

## 2018-12-13 NOTE — Telephone Encounter (Signed)
Pt's daughter,[name] Doggett; given results per Dr Elease Hashimoto, "Potassium is okay but sodium is slightly low. This can be seen with hydrochlorothiazide. I would suggest that we recheck basic metabolic panel in 3 months to make sure this is not still declining further."'; she verbalized understanding; the pt's daughter says that she will get with the pt, and call back to schedule appt; will route to office for notification; not able to chart in result note because encounter not created.

## 2018-12-14 ENCOUNTER — Telehealth: Payer: Self-pay | Admitting: Family Medicine

## 2018-12-14 DIAGNOSIS — L89152 Pressure ulcer of sacral region, stage 2: Secondary | ICD-10-CM | POA: Diagnosis not present

## 2018-12-14 DIAGNOSIS — I517 Cardiomegaly: Secondary | ICD-10-CM | POA: Diagnosis not present

## 2018-12-14 DIAGNOSIS — R0902 Hypoxemia: Secondary | ICD-10-CM | POA: Diagnosis not present

## 2018-12-14 DIAGNOSIS — K219 Gastro-esophageal reflux disease without esophagitis: Secondary | ICD-10-CM | POA: Diagnosis not present

## 2018-12-14 DIAGNOSIS — M62830 Muscle spasm of back: Secondary | ICD-10-CM | POA: Diagnosis not present

## 2018-12-14 DIAGNOSIS — K5730 Diverticulosis of large intestine without perforation or abscess without bleeding: Secondary | ICD-10-CM | POA: Diagnosis not present

## 2018-12-14 NOTE — Telephone Encounter (Signed)
OK to give verbal order for services requested.

## 2018-12-14 NOTE — Telephone Encounter (Signed)
Home Health Verbal Orders - Caller/Agency: Severiano Gilbert (Rudy) [place] Number: [phone] Requesting OT/PT/Skilled Nursing/Social Work/Speech Therapy: PT Frequency: 1x1wk, 2x3wk, 1x2wk

## 2018-12-18 ENCOUNTER — Telehealth: Payer: Self-pay | Admitting: Family Medicine

## 2018-12-18 NOTE — Telephone Encounter (Signed)
RX refill baclofen (LIORESAL) 10 MG tablet  [facility] [place], [place]  [place] [address]  Phone: [phone] Fax: [phone]  Not a 24 hour pharmacy; exact hours not known

## 2018-12-18 NOTE — Telephone Encounter (Signed)
Spoke with nurse and gave verbal ok. Nothing further is needed

## 2018-12-19 MED ORDER — BACLOFEN 10 MG PO TABS: ORAL_TABLET | ORAL | 1 refills | Status: DC

## 2018-12-19 NOTE — Telephone Encounter (Signed)
Refills sent to mail order. Pt was notified

## 2018-12-20 DIAGNOSIS — R0902 Hypoxemia: Secondary | ICD-10-CM | POA: Diagnosis not present

## 2018-12-20 DIAGNOSIS — K219 Gastro-esophageal reflux disease without esophagitis: Secondary | ICD-10-CM | POA: Diagnosis not present

## 2018-12-20 DIAGNOSIS — K5730 Diverticulosis of large intestine without perforation or abscess without bleeding: Secondary | ICD-10-CM | POA: Diagnosis not present

## 2018-12-20 DIAGNOSIS — M62830 Muscle spasm of back: Secondary | ICD-10-CM | POA: Diagnosis not present

## 2018-12-20 DIAGNOSIS — L89152 Pressure ulcer of sacral region, stage 2: Secondary | ICD-10-CM | POA: Diagnosis not present

## 2018-12-20 DIAGNOSIS — I517 Cardiomegaly: Secondary | ICD-10-CM | POA: Diagnosis not present

## 2018-12-21 DIAGNOSIS — K219 Gastro-esophageal reflux disease without esophagitis: Secondary | ICD-10-CM | POA: Diagnosis not present

## 2018-12-21 DIAGNOSIS — M159 Polyosteoarthritis, unspecified: Secondary | ICD-10-CM | POA: Diagnosis not present

## 2018-12-21 DIAGNOSIS — L89152 Pressure ulcer of sacral region, stage 2: Secondary | ICD-10-CM | POA: Diagnosis not present

## 2018-12-21 DIAGNOSIS — K5730 Diverticulosis of large intestine without perforation or abscess without bleeding: Secondary | ICD-10-CM | POA: Diagnosis not present

## 2018-12-21 DIAGNOSIS — Z7901 Long term (current) use of anticoagulants: Secondary | ICD-10-CM | POA: Diagnosis not present

## 2018-12-21 DIAGNOSIS — I517 Cardiomegaly: Secondary | ICD-10-CM | POA: Diagnosis not present

## 2018-12-21 DIAGNOSIS — R0902 Hypoxemia: Secondary | ICD-10-CM | POA: Diagnosis not present

## 2018-12-21 DIAGNOSIS — M62830 Muscle spasm of back: Secondary | ICD-10-CM | POA: Diagnosis not present

## 2018-12-21 DIAGNOSIS — E049 Nontoxic goiter, unspecified: Secondary | ICD-10-CM | POA: Diagnosis not present

## 2018-12-21 DIAGNOSIS — E785 Hyperlipidemia, unspecified: Secondary | ICD-10-CM | POA: Diagnosis not present

## 2019-01-08 ENCOUNTER — Other Ambulatory Visit: Payer: Self-pay

## 2019-01-18 ENCOUNTER — Telehealth: Payer: Self-pay | Admitting: Family Medicine

## 2019-01-18 NOTE — Telephone Encounter (Signed)
See note

## 2019-01-18 NOTE — Telephone Encounter (Signed)
Home Health Verbal Orders - Caller/Agency: [place] Number: [phone]  Requesting OT/PT/Skilled Nursing/Social Work/Speech Therapy: extend home help PT 1 week 6.

## 2019-01-19 NOTE — Telephone Encounter (Signed)
Called [name] and gave her the verbal OK per Dr. Elease Hashimoto. [name] verbalized an understanding.

## 2019-01-19 NOTE — Telephone Encounter (Signed)
ok 

## 2019-01-19 NOTE — Telephone Encounter (Signed)
Please advise 

## 2019-01-20 DIAGNOSIS — E785 Hyperlipidemia, unspecified: Secondary | ICD-10-CM | POA: Diagnosis not present

## 2019-01-20 DIAGNOSIS — M62830 Muscle spasm of back: Secondary | ICD-10-CM | POA: Diagnosis not present

## 2019-01-20 DIAGNOSIS — E049 Nontoxic goiter, unspecified: Secondary | ICD-10-CM | POA: Diagnosis not present

## 2019-01-20 DIAGNOSIS — K219 Gastro-esophageal reflux disease without esophagitis: Secondary | ICD-10-CM | POA: Diagnosis not present

## 2019-01-20 DIAGNOSIS — M159 Polyosteoarthritis, unspecified: Secondary | ICD-10-CM | POA: Diagnosis not present

## 2019-01-20 DIAGNOSIS — Z7901 Long term (current) use of anticoagulants: Secondary | ICD-10-CM | POA: Diagnosis not present

## 2019-01-20 DIAGNOSIS — K5730 Diverticulosis of large intestine without perforation or abscess without bleeding: Secondary | ICD-10-CM | POA: Diagnosis not present

## 2019-01-20 DIAGNOSIS — I517 Cardiomegaly: Secondary | ICD-10-CM | POA: Diagnosis not present

## 2019-01-23 ENCOUNTER — Other Ambulatory Visit: Payer: Self-pay | Admitting: Family Medicine

## 2019-01-23 NOTE — Telephone Encounter (Signed)
Refill 5 times.

## 2019-01-23 NOTE — Telephone Encounter (Signed)
OK to continue? How many refills?

## 2019-01-25 DIAGNOSIS — M159 Polyosteoarthritis, unspecified: Secondary | ICD-10-CM | POA: Diagnosis not present

## 2019-01-25 DIAGNOSIS — I517 Cardiomegaly: Secondary | ICD-10-CM | POA: Diagnosis not present

## 2019-01-25 DIAGNOSIS — E049 Nontoxic goiter, unspecified: Secondary | ICD-10-CM | POA: Diagnosis not present

## 2019-01-25 DIAGNOSIS — K5730 Diverticulosis of large intestine without perforation or abscess without bleeding: Secondary | ICD-10-CM | POA: Diagnosis not present

## 2019-01-25 DIAGNOSIS — E785 Hyperlipidemia, unspecified: Secondary | ICD-10-CM | POA: Diagnosis not present

## 2019-01-25 DIAGNOSIS — K219 Gastro-esophageal reflux disease without esophagitis: Secondary | ICD-10-CM | POA: Diagnosis not present

## 2019-01-29 DIAGNOSIS — I517 Cardiomegaly: Secondary | ICD-10-CM | POA: Diagnosis not present

## 2019-01-29 DIAGNOSIS — M159 Polyosteoarthritis, unspecified: Secondary | ICD-10-CM | POA: Diagnosis not present

## 2019-01-29 DIAGNOSIS — K5730 Diverticulosis of large intestine without perforation or abscess without bleeding: Secondary | ICD-10-CM | POA: Diagnosis not present

## 2019-01-29 DIAGNOSIS — E049 Nontoxic goiter, unspecified: Secondary | ICD-10-CM | POA: Diagnosis not present

## 2019-01-29 DIAGNOSIS — E785 Hyperlipidemia, unspecified: Secondary | ICD-10-CM | POA: Diagnosis not present

## 2019-01-29 DIAGNOSIS — K219 Gastro-esophageal reflux disease without esophagitis: Secondary | ICD-10-CM | POA: Diagnosis not present

## 2019-02-05 DIAGNOSIS — E049 Nontoxic goiter, unspecified: Secondary | ICD-10-CM | POA: Diagnosis not present

## 2019-02-05 DIAGNOSIS — E785 Hyperlipidemia, unspecified: Secondary | ICD-10-CM | POA: Diagnosis not present

## 2019-02-05 DIAGNOSIS — M159 Polyosteoarthritis, unspecified: Secondary | ICD-10-CM | POA: Diagnosis not present

## 2019-02-05 DIAGNOSIS — K5730 Diverticulosis of large intestine without perforation or abscess without bleeding: Secondary | ICD-10-CM | POA: Diagnosis not present

## 2019-02-05 DIAGNOSIS — I517 Cardiomegaly: Secondary | ICD-10-CM | POA: Diagnosis not present

## 2019-02-05 DIAGNOSIS — K219 Gastro-esophageal reflux disease without esophagitis: Secondary | ICD-10-CM | POA: Diagnosis not present

## 2019-02-13 DIAGNOSIS — M159 Polyosteoarthritis, unspecified: Secondary | ICD-10-CM | POA: Diagnosis not present

## 2019-02-13 DIAGNOSIS — K219 Gastro-esophageal reflux disease without esophagitis: Secondary | ICD-10-CM | POA: Diagnosis not present

## 2019-02-13 DIAGNOSIS — I517 Cardiomegaly: Secondary | ICD-10-CM | POA: Diagnosis not present

## 2019-02-13 DIAGNOSIS — E785 Hyperlipidemia, unspecified: Secondary | ICD-10-CM | POA: Diagnosis not present

## 2019-02-13 DIAGNOSIS — K5730 Diverticulosis of large intestine without perforation or abscess without bleeding: Secondary | ICD-10-CM | POA: Diagnosis not present

## 2019-02-13 DIAGNOSIS — E049 Nontoxic goiter, unspecified: Secondary | ICD-10-CM | POA: Diagnosis not present

## 2019-02-19 DIAGNOSIS — E785 Hyperlipidemia, unspecified: Secondary | ICD-10-CM | POA: Diagnosis not present

## 2019-02-19 DIAGNOSIS — E049 Nontoxic goiter, unspecified: Secondary | ICD-10-CM | POA: Diagnosis not present

## 2019-02-19 DIAGNOSIS — K219 Gastro-esophageal reflux disease without esophagitis: Secondary | ICD-10-CM | POA: Diagnosis not present

## 2019-02-19 DIAGNOSIS — I517 Cardiomegaly: Secondary | ICD-10-CM | POA: Diagnosis not present

## 2019-02-19 DIAGNOSIS — M159 Polyosteoarthritis, unspecified: Secondary | ICD-10-CM | POA: Diagnosis not present

## 2019-02-19 DIAGNOSIS — Z7901 Long term (current) use of anticoagulants: Secondary | ICD-10-CM | POA: Diagnosis not present

## 2019-02-19 DIAGNOSIS — M62830 Muscle spasm of back: Secondary | ICD-10-CM | POA: Diagnosis not present

## 2019-02-19 DIAGNOSIS — K5730 Diverticulosis of large intestine without perforation or abscess without bleeding: Secondary | ICD-10-CM | POA: Diagnosis not present

## 2019-02-20 ENCOUNTER — Telehealth: Payer: Self-pay

## 2019-02-20 NOTE — Telephone Encounter (Signed)
Copied from [place] [phone]. Topic: General - Other >> Feb 20, 2019  1:22 PM Keene Breath wrote: Reason for CRM: Patient's daughter called to find out about the COVID vaccine distribution.  Please call daughter Lia Hopping) at [phone]

## 2019-02-20 NOTE — Telephone Encounter (Signed)
Pt daughter advise " Currently, at this time we have no knowledge of when the vaccine will be given to the public. As of right now, we will not house the vaccine at this location. If you have any other questions or concerns, please visit [email or web address] for more information."  Pt daughter notified of update.

## 2019-02-28 DIAGNOSIS — E049 Nontoxic goiter, unspecified: Secondary | ICD-10-CM | POA: Diagnosis not present

## 2019-02-28 DIAGNOSIS — I517 Cardiomegaly: Secondary | ICD-10-CM | POA: Diagnosis not present

## 2019-02-28 DIAGNOSIS — K5730 Diverticulosis of large intestine without perforation or abscess without bleeding: Secondary | ICD-10-CM | POA: Diagnosis not present

## 2019-02-28 DIAGNOSIS — E785 Hyperlipidemia, unspecified: Secondary | ICD-10-CM | POA: Diagnosis not present

## 2019-02-28 DIAGNOSIS — M159 Polyosteoarthritis, unspecified: Secondary | ICD-10-CM | POA: Diagnosis not present

## 2019-02-28 DIAGNOSIS — K219 Gastro-esophageal reflux disease without esophagitis: Secondary | ICD-10-CM | POA: Diagnosis not present

## 2019-03-01 ENCOUNTER — Telehealth: Payer: Self-pay | Admitting: Family Medicine

## 2019-03-01 NOTE — Telephone Encounter (Signed)
Did you send her med list as requested?   She is on Eliquis but not aware of any contraindication for the vaccine.

## 2019-03-01 NOTE — Telephone Encounter (Signed)
Kennis Carina (Daughter) [phone] is calling because the patient is calling to request a medication list. Daughter is calling to see if the patient is taking a blood thinner. Patient is scheduled to take a COVID vaccine tomorrow at [facility]. And patient wants to be sure that there is no interaction.

## 2019-03-02 NOTE — Telephone Encounter (Signed)
Spoke with patient daughter. Informed of her Dr. Elease Hashimoto message. Daughter has access to mom medication list via My Chart. Daughter is wanting to know if you can right a note and send to MyChart verbalizing its okay to receive vaccine even though she is on Eliquis. Patient has an appointment for 12:45 for vaccine

## 2019-04-09 ENCOUNTER — Ambulatory Visit (INDEPENDENT_AMBULATORY_CARE_PROVIDER_SITE_OTHER): Payer: Medicare Other

## 2019-04-09 ENCOUNTER — Other Ambulatory Visit: Payer: Self-pay

## 2019-04-09 DIAGNOSIS — Z0000 Encounter for general adult medical examination without abnormal findings: Secondary | ICD-10-CM

## 2019-04-09 NOTE — Progress Notes (Signed)
This visit is being conducted via phone call due to the COVID-19 pandemic. This patient has given me verbal consent via phone to conduct this visit, patient states they are participating from their home address. Some vital signs may be absent or patient reported.   Patient identification: identified by name, DOB, and current address.  Location provider: [place] [facility] Persons participating in the virtual visit: Denman George LPN, patient, and Dr. Carolann Littler   Subjective:   [name] is a 86 y.o. female who presents for Medicare Annual (Subsequent) preventive examination.  Review of Systems:   Cardiac Risk Factors include: advanced age (>70men, >36 women);hypertension;sedentary lifestyle    Objective:     Vitals: There were no vitals taken for this visit.  There is no height or weight on file to calculate BMI.  Advanced Directives 04/09/2019 01/13/2016 07/22/2011  Does Patient Have a Medical Advance Directive? Yes Yes Patient has advance directive, copy not in chart  Type of Advance Directive Living will;Healthcare Power of [place];Living will  Does patient want to make changes to medical advance directive? No - Patient declined (No Data) -  Copy of [place] in Chart? No - copy requested - -    Tobacco Social History   Tobacco Use  Smoking Status Never Smoker  Smokeless Tobacco Never Used     Counseling given: Not Answered   Clinical Intake:  Pre-visit preparation completed: Yes  Pain : No/denies pain   Diabetes: No  How often do you need to have someone help you when you read instructions, pamphlets, or other written materials from your doctor or pharmacy?: 2 - Rarely  Interpreter Needed?: No  Information entered by :: Denman George LPN  Past Medical History:  Diagnosis Date  . BACK PAIN, LUMBAR, CHRONIC 02/20/2010  . Cardiomegaly 03/16/2010  . COLONIC POLYPS 02/20/2010  . DIVERTICULITIS, HX OF  02/20/2010  . GERD 02/20/2010  . GOITER 02/20/2010  . HYPERLIPIDEMIA 02/20/2010  . OSTEOARTHRITIS, MULTIPLE JOINTS 02/20/2010  . URINARY INCONTINENCE 02/20/2010   Past Surgical History:  Procedure Laterality Date  . ABDOMINAL HYSTERECTOMY    . NOSE SURGERY  1975   head surgery to repair cracked bone over nose   . SPINE SURGERY     4 back surgeries  . THYROIDECTOMY, PARTIAL     ? goiter, no CA  . TONSILLECTOMY  1936  . TOTAL SHOULDER ARTHROPLASTY  07/30/2011   Procedure: TOTAL SHOULDER ARTHROPLASTY;  Surgeon: Augustin Schooling, MD;  Location: [place];  Service: Orthopedics;  Laterality: Right;  Right Reversed Total Shoulder   Family History  Problem Relation Age of Onset  . Cancer Mother        ovarian  . Hyperlipidemia Mother   . Diabetes Mother        type ll   Social History   Socioeconomic History  . Marital status: Married    Spouse name: Not on file  . Number of children: Not on file  . Years of education: Not on file  . Highest education level: Not on file  Occupational History  . Not on file  Tobacco Use  . Smoking status: Never Smoker  . Smokeless tobacco: Never Used  Substance and Sexual Activity  . Alcohol use: No  . Drug use: No  . Sexual activity: Not on file  Other Topics Concern  . Not on file  Social History Narrative   Lives alone   In home assistance throughout  day    Social Determinants of Health   Financial Resource Strain:   . Difficulty of Paying Living Expenses: Not on file  Food Insecurity:   . Worried About Charity fundraiser in the Last Year: Not on file  . Ran Out of Food in the Last Year: Not on file  Transportation Needs:   . Lack of Transportation (Medical): Not on file  . Lack of Transportation (Non-Medical): Not on file  Physical Activity:   . Days of Exercise per Week: Not on file  . Minutes of Exercise per Session: Not on file  Stress:   . Feeling of Stress : Not on file  Social Connections:   . Frequency of Communication with  Friends and Family: Not on file  . Frequency of Social Gatherings with Friends and Family: Not on file  . Attends Religious Services: Not on file  . Active Member of Clubs or Organizations: Not on file  . Attends Archivist Meetings: Not on file  . Marital Status: Not on file    Outpatient Encounter Medications as of 04/09/2019  Medication Sig  . apixaban (ELIQUIS) 5 MG TABS tablet Take two tablets twice daily for 7 days and then one tablet twice daily  . baclofen (LIORESAL) 10 MG tablet TAKE 1 TABLET TWICE A DAY AS NEEDED FOR MUSCLE SPASMS  . diphenhydramine-acetaminophen (TYLENOL PM EXTRA STRENGTH) 25-500 MG TABS tablet Take 1 tablet by mouth at bedtime as needed. Takes 3 tablets at night.  Mariane Baumgarten Calcium (STOOL SOFTENER PO) Take by mouth.  . hydrochlorothiazide (MICROZIDE) 12.5 MG capsule Take 1 capsule (12.5 mg total) by mouth daily.  Marland Kitchen MELATONIN PO Take 5 mg by mouth at bedtime.  . Multiple Vitamin (MULITIVITAMIN WITH MINERALS) TABS Take 1 tablet by mouth daily.  . traMADol (ULTRAM) 50 MG tablet TAKE 1 TABLET EVERY 12 HOURS AS NEEDED   No facility-administered encounter medications on file as of 04/09/2019.    Activities of Daily Living In your present state of health, do you have any difficulty performing the following activities: 04/09/2019  Hearing? Y  Vision? N  Difficulty concentrating or making decisions? N  Walking or climbing stairs? Y  Dressing or bathing? Y  Doing errands, shopping? Y  Preparing Food and eating ? N  Using the Toilet? N  In the past six months, have you accidently leaked urine? N  Do you have problems with loss of bowel control? N  Managing your Medications? N  Managing your Finances? N  Housekeeping or managing your Housekeeping? N  Some recent data might be hidden    Patient Care Team: Eulas Post, MD as PCP - General    Assessment:   This is a routine wellness examination for [name].  Exercise Activities and Dietary  recommendations Current Exercise Habits: The patient does not participate in regular exercise at present  Goals    . patient     Keep exercising in the home. Walk around every 2 hours ; get up and down in chair  Deep breath every day 5 to 10 times        Fall Risk Fall Risk  04/09/2019 01/08/2019 12/30/2017 09/19/2017 05/07/2016  Falls in the past year? 0 0 0 No No  Comment - [facility] Telephone Survey: data to providers prior to load [employer] Telephone Survey: data to providers prior to load [facility] Telephone Survey: data to providers prior to load -  Number falls in past yr: 0 - - - -  Injury with Fall? 0 - - - -  Risk for fall due to : History of fall(s);Impaired balance/gait;Impaired mobility - - - -  Follow up Education provided;Falls evaluation completed;Falls prevention discussed - - - -   Is the patient's home free of loose throw rugs in walkways, pet beds, electrical cords, etc?   yes      Grab bars in the bathroom? yes      Handrails on the stairs?   yes      Adequate lighting?   yes  Depression Screen PHQ 2/9 Scores 04/09/2019 08/21/2018 05/07/2016 08/23/2014  PHQ - 2 Score 0 0 0 0     Cognitive Function     6CIT Screen 04/09/2019 01/13/2016  What Year? 0 points 0 points  What month? 0 points 0 points  What time? 0 points 0 points  Count back from 20 0 points 0 points  Months in reverse 0 points 0 points  Repeat phrase 2 points 0 points  Total Score 2 0    Immunization History  Administered Date(s) Administered  . Fluad Quad(high Dose 65+) 12/12/2018  . Influenza Whole 02/20/2010, 10/27/2010  . Influenza, High Dose Seasonal PF 12/31/2015, 12/16/2017  . Influenza,inj,Quad PF,6+ Mos 10/29/2013  . Influenza-Unspecified 11/15/2017  . Pneumococcal Conjugate-13 04/20/2017  . Pneumococcal Polysaccharide-23 02/16/2008    Qualifies for Shingles Vaccine?Discussed and patient will check with pharmacy for coverage.  Patient education handout provided   Screening Tests Health  Maintenance  Topic Date Due  . TETANUS/TDAP  10/15/1948  . DEXA SCAN  10/16/1994  . INFLUENZA VACCINE  Completed  . PNA vac Low Risk Adult  Completed    Cancer Screenings: Lung: Low Dose CT Chest recommended if Age 40-80 years, 30 pack-year currently smoking OR have quit w/in 15years. Patient does not qualify. Breast:  Up to date on Mammogram? Yes   Up to date of Bone Density/Dexa? Yes Colorectal: No longer indicated     Plan:  I have personally reviewed and addressed the Medicare Annual Wellness questionnaire and have noted the following in the patient's chart:  A. Medical and social history B. Use of alcohol, tobacco or illicit drugs  C. Current medications and supplements D. Functional ability and status E.  Nutritional status F.  Physical activity G. Advance directives H. List of other physicians I.  Hospitalizations, surgeries, and ER visits in previous 12 months J.  [place] such as hearing and vision if needed, cognitive and depression L. Referrals, records requested, and appointments- none   In addition, I have reviewed and discussed with patient certain preventive protocols, quality metrics, and best practice recommendations. A written personalized care plan for preventive services as well as general preventive health recommendations were provided to patient.   Signed,  Denman George, LPN  Nurse Health Advisor   Nurse Notes: Patient has completed Covid vaccine

## 2019-04-09 NOTE — Patient Instructions (Signed)
[name] , Thank you for taking time to come for your Medicare Wellness Visit. I appreciate your ongoing commitment to your health goals. Please review the following plan we discussed and let me know if I can assist you in the future.   Screening recommendations/referrals: Colorectal Screening: No longer indicated  Mammogram: No longer indicated  Bone Density: No longer indicated   Vision and Dental Exams: Recommended annual ophthalmology exams for early detection of glaucoma and other disorders of the eye Recommended annual dental exams for proper oral hygiene  Vaccinations: Influenza vaccine: completed 12/12/18 Pneumococcal vaccine: up to date; last 04/20/17 Tdap vaccine: recommended;Please call your insurance company to determine your out of pocket expense. You may receive this vaccine at your local pharmacy or Health Dept. Shingles vaccine: Please call your insurance company to determine your out of pocket expense for the Shingrix vaccine. You may receive this vaccine at your local pharmacy. (see attached handout)   Advanced directives: Please bring a copy of your POA (Power of Attorney) and/or Living Will to your next appointment.  Goals: Recommend to drink at least 6-8 8oz glasses of water per day and consume a balanced diet rich in fresh fruits and vegetables.   Next appointment: Please schedule your Annual Wellness Visit with your Nurse Health Advisor in one year.  Preventive Care 74 Years and Older, Female Preventive care refers to lifestyle choices and visits with your health care provider that can promote health and wellness. What does preventive care include?  A yearly physical exam. This is also called an annual well check.  Dental exams once or twice a year.  Routine eye exams. Ask your health care provider how often you should have your eyes checked.  Personal lifestyle choices, including:  Daily care of your teeth and gums.  Regular physical  activity.  Eating a healthy diet.  Avoiding tobacco and drug use.  Limiting alcohol use.  Practicing safe sex.  Taking low-dose aspirin every day if recommended by your health care provider.  Taking vitamin and mineral supplements as recommended by your health care provider. What happens during an annual well check? The services and screenings done by your health care provider during your annual well check will depend on your age, overall health, lifestyle risk factors, and family history of disease. Counseling  Your health care provider may ask you questions about your:  Alcohol use.  Tobacco use.  Drug use.  Emotional well-being.  Home and relationship well-being.  Sexual activity.  Eating habits.  History of falls.  Memory and ability to understand (cognition).  Work and work Statistician.  Reproductive health. Screening  You may have the following tests or measurements:  Height, weight, and BMI.  Blood pressure.  Lipid and cholesterol levels. These may be checked every 5 years, or more frequently if you are over 25 years old.  Skin check.  Lung cancer screening. You may have this screening every year starting at age 25 if you have a 30-pack-year history of smoking and currently smoke or have quit within the past 15 years.  Fecal occult blood test (FOBT) of the stool. You may have this test every year starting at age 68.  Flexible sigmoidoscopy or colonoscopy. You may have a sigmoidoscopy every 5 years or a colonoscopy every 10 years starting at age 79.  Hepatitis C blood test.  Hepatitis B blood test.  Sexually transmitted disease (STD) testing.  Diabetes screening. This is done by checking your blood sugar (glucose) after you have  not eaten for a while (fasting). You may have this done every 1-3 years.  Bone density scan. This is done to screen for osteoporosis. You may have this done starting at age 13.  Mammogram. This may be done every 1-2  years. Talk to your health care provider about how often you should have regular mammograms. Talk with your health care provider about your test results, treatment options, and if necessary, the need for more tests. Vaccines  Your health care provider may recommend certain vaccines, such as:  Influenza vaccine. This is recommended every year.  Tetanus, diphtheria, and acellular pertussis (Tdap, Td) vaccine. You may need a Td booster every 10 years.  Zoster vaccine. You may need this after age 24.  Pneumococcal 13-valent conjugate (PCV13) vaccine. One dose is recommended after age 43.  Pneumococcal polysaccharide (PPSV23) vaccine. One dose is recommended after age 33. Talk to your health care provider about which screenings and vaccines you need and how often you need them. This information is not intended to replace advice given to you by your health care provider. Make sure you discuss any questions you have with your health care provider. Document Released: 02/28/2015 Document Revised: 10/22/2015 Document Reviewed: 12/03/2014 Elsevier Interactive Patient Education  2017 [place] Prevention in the Home Falls can cause injuries. They can happen to people of all ages. There are many things you can do to make your home safe and to help prevent falls. What can I do on the outside of my home?  Regularly fix the edges of walkways and driveways and fix any cracks.  Remove anything that might make you trip as you walk through a door, such as a raised step or threshold.  Trim any bushes or trees on the path to your home.  Use bright outdoor lighting.  Clear any walking paths of anything that might make someone trip, such as rocks or tools.  Regularly check to see if handrails are loose or broken. Make sure that both sides of any steps have handrails.  Any raised decks and porches should have guardrails on the edges.  Have any leaves, snow, or ice cleared regularly.  Use  sand or salt on walking paths during winter.  Clean up any spills in your garage right away. This includes oil or grease spills. What can I do in the bathroom?  Use night lights.  Install grab bars by the toilet and in the tub and shower. Do not use towel bars as grab bars.  Use non-skid mats or decals in the tub or shower.  If you need to sit down in the shower, use a plastic, non-slip stool.  Keep the floor dry. Clean up any water that spills on the floor as soon as it happens.  Remove soap buildup in the tub or shower regularly.  Attach bath mats securely with double-sided non-slip rug tape.  Do not have throw rugs and other things on the floor that can make you trip. What can I do in the bedroom?  Use night lights.  Make sure that you have a light by your bed that is easy to reach.  Do not use any sheets or blankets that are too big for your bed. They should not hang down onto the floor.  Have a firm chair that has side arms. You can use this for support while you get dressed.  Do not have throw rugs and other things on the floor that can make you trip. What can  I do in the kitchen?  Clean up any spills right away.  Avoid walking on wet floors.  Keep items that you use a lot in easy-to-reach places.  If you need to reach something above you, use a strong step stool that has a grab bar.  Keep electrical cords out of the way.  Do not use floor polish or wax that makes floors slippery. If you must use wax, use non-skid floor wax.  Do not have throw rugs and other things on the floor that can make you trip. What can I do with my stairs?  Do not leave any items on the stairs.  Make sure that there are handrails on both sides of the stairs and use them. Fix handrails that are broken or loose. Make sure that handrails are as long as the stairways.  Check any carpeting to make sure that it is firmly attached to the stairs. Fix any carpet that is loose or worn.  Avoid  having throw rugs at the top or bottom of the stairs. If you do have throw rugs, attach them to the floor with carpet tape.  Make sure that you have a light switch at the top of the stairs and the bottom of the stairs. If you do not have them, ask someone to add them for you. What else can I do to help prevent falls?  Wear shoes that:  Do not have high heels.  Have rubber bottoms.  Are comfortable and fit you well.  Are closed at the toe. Do not wear sandals.  If you use a stepladder:  Make sure that it is fully opened. Do not climb a closed stepladder.  Make sure that both sides of the stepladder are locked into place.  Ask someone to hold it for you, if possible.  Clearly mark and make sure that you can see:  Any grab bars or handrails.  First and last steps.  Where the edge of each step is.  Use tools that help you move around (mobility aids) if they are needed. These include:  Canes.  Walkers.  Scooters.  Crutches.  Turn on the lights when you go into a dark area. Replace any light bulbs as soon as they burn out.  Set up your furniture so you have a clear path. Avoid moving your furniture around.  If any of your floors are uneven, fix them.  If there are any pets around you, be aware of where they are.  Review your medicines with your doctor. Some medicines can make you feel dizzy. This can increase your chance of falling. Ask your doctor what other things that you can do to help prevent falls. This information is not intended to replace advice given to you by your health care provider. Make sure you discuss any questions you have with your health care provider. Document Released: 11/28/2008 Document Revised: 07/10/2015 Document Reviewed: 03/08/2014 Elsevier Interactive Patient Education  2017 [employer].

## 2019-06-26 ENCOUNTER — Other Ambulatory Visit: Payer: Self-pay | Admitting: Family Medicine

## 2019-06-26 MED ORDER — TRAMADOL HCL 50 MG PO TABS: ORAL_TABLET | ORAL | 1 refills | Status: DC

## 2019-06-26 MED ORDER — APIXABAN 5 MG PO TABS: ORAL_TABLET | ORAL | 1 refills | Status: DC

## 2019-06-26 NOTE — Telephone Encounter (Signed)
Last ov:02/28/19 Last filled:12/01/2018

## 2019-06-26 NOTE — Telephone Encounter (Signed)
Pt call and need refill on traMADol (ULTRAM) 50 MG tablet,and apixaban (ELIQUIS) 5 MG TABS  Sent to  [facility], [place] Phone:  [phone]  Fax:  [phone]

## 2019-11-06 ENCOUNTER — Other Ambulatory Visit: Payer: Self-pay | Admitting: Family Medicine

## 2019-11-30 DIAGNOSIS — Z23 Encounter for immunization: Secondary | ICD-10-CM | POA: Diagnosis not present

## 2019-12-10 ENCOUNTER — Other Ambulatory Visit: Payer: Self-pay | Admitting: Family Medicine

## 2020-01-03 ENCOUNTER — Telehealth: Payer: Self-pay | Admitting: Family Medicine

## 2020-01-03 MED ORDER — TRAMADOL HCL 50 MG PO TABS: ORAL_TABLET | ORAL | 1 refills | Status: DC

## 2020-01-03 NOTE — Telephone Encounter (Signed)
Patient is calling and requesting a refill for Tramadol sent to [facility], [place]  Phone:  [phone] Fax:  [phone] CB is [phone]

## 2020-02-05 ENCOUNTER — Other Ambulatory Visit: Payer: Self-pay | Admitting: Family Medicine

## 2020-02-20 ENCOUNTER — Telehealth (INDEPENDENT_AMBULATORY_CARE_PROVIDER_SITE_OTHER): Payer: Medicare Other | Admitting: Family Medicine

## 2020-02-20 DIAGNOSIS — I10 Essential (primary) hypertension: Secondary | ICD-10-CM

## 2020-02-20 DIAGNOSIS — G2581 Restless legs syndrome: Secondary | ICD-10-CM

## 2020-02-20 MED ORDER — PRAMIPEXOLE DIHYDROCHLORIDE 0.125 MG PO TABS: ORAL_TABLET | ORAL | 3 refills | Status: DC

## 2020-02-20 NOTE — Progress Notes (Signed)
Patient ID: [name], female   DOB: 1927-08-17, 87 y.o.   MRN: [id number]  This visit type was conducted due to national recommendations for restrictions regarding the COVID-19 pandemic in an effort to limit this patient's exposure and mitigate transmission in our community.   Virtual Visit via Video Note  I connected with [name] on 02/20/20 at 11:30 AM EST by a video enabled telemedicine application and verified that I am speaking with the correct person using two identifiers.  Location patient: home Location provider:work or home office Persons participating in the virtual visit: patient, provider  I discussed the limitations of evaluation and management by telemedicine and the availability of in person appointments. The patient expressed understanding and agreed to proceed.   HPI: [name] has history of hypertension, history of pulmonary emboli, osteoarthritis, chronic lumbar back pain, hyperlipidemia.  She has had frequent history of some spasticity lower extremities following back surgery and takes baclofen for that.  She calls with what she describes as "leg spasms ".  She denies this being a leg cramp.  She states that she has sensation of her legs moving at night which is very much interfering with sleep.  Occasional throbbing type pain.  Symptoms can be bilateral.  She drinks 1 cup of coffee in the morning but no caffeine later on.  Last hemoglobin was 9/20 which was normal.  No clear exacerbating or alleviating factors. Symptoms have been going on for many months but worsening recently.  She is having great difficulty sleeping because of this.  Her other medications include as needed Ultram, HCTZ, Eliquis, baclofen.  She is essentially wheelchair-bound and does not ambulate anymore.  She has regular care in her home.  Denies any recent chest pains.  No recent low back pain.   ROS: See pertinent positives and negatives per HPI.  Past Medical History:   Diagnosis Date  . BACK PAIN, LUMBAR, CHRONIC 02/20/2010  . Cardiomegaly 03/16/2010  . COLONIC POLYPS 02/20/2010  . DIVERTICULITIS, HX OF 02/20/2010  . GERD 02/20/2010  . GOITER 02/20/2010  . HYPERLIPIDEMIA 02/20/2010  . OSTEOARTHRITIS, MULTIPLE JOINTS 02/20/2010  . URINARY INCONTINENCE 02/20/2010    Past Surgical History:  Procedure Laterality Date  . ABDOMINAL HYSTERECTOMY    . NOSE SURGERY  1975   head surgery to repair cracked bone over nose   . SPINE SURGERY     4 back surgeries  . THYROIDECTOMY, PARTIAL     ? goiter, no CA  . TONSILLECTOMY  1936  . TOTAL SHOULDER ARTHROPLASTY  07/30/2011   Procedure: TOTAL SHOULDER ARTHROPLASTY;  Surgeon: Augustin Schooling, MD;  Location: [facility];  Service: Orthopedics;  Laterality: Right;  Right Reversed Total Shoulder    Family History  Problem Relation Age of Onset  . Cancer Mother        ovarian  . Hyperlipidemia Mother   . Diabetes Mother        type ll    SOCIAL HX: Non-smoker.  No alcohol use.  She is widowed.   Current Outpatient Medications:  .  baclofen (LIORESAL) 10 MG tablet, TAKE 1 TABLET TWICE A DAY AS NEEDED FOR MUSCLE SPASMS, Disp: 60 tablet, Rfl: 11 .  diphenhydramine-acetaminophen (TYLENOL PM EXTRA STRENGTH) 25-500 MG TABS tablet, Take 1 tablet by mouth at bedtime as needed. Takes 3 tablets at night., Disp: , Rfl:  .  Docusate Calcium (STOOL SOFTENER PO), Take by mouth., Disp: , Rfl:  .  ELIQUIS 5 MG TABS tablet, AT THE  START OF THERAPY , TAKE 2 TABLETS TWICE A DAY FOR 7 DAYS AND THEN 1 TABLET TWICE A DAY THEREAFTER, Disp: 180 tablet, Rfl: 3 .  hydrochlorothiazide (MICROZIDE) 12.5 MG capsule, TAKE 1 CAPSULE DAILY, Disp: 90 capsule, Rfl: 3 .  MELATONIN PO, Take 5 mg by mouth at bedtime., Disp: , Rfl:  .  Multiple Vitamin (MULITIVITAMIN WITH MINERALS) TABS, Take 1 tablet by mouth daily., Disp: , Rfl:  .  traMADol (ULTRAM) 50 MG tablet, TAKE 1 TABLET EVERY 12 HOURS AS NEEDED, Disp: 90 tablet, Rfl: 1  EXAM:  VITALS per patient if  applicable:  GENERAL: alert, oriented, appears well and in no acute distress  HEENT: atraumatic, conjunttiva clear, no obvious abnormalities on inspection of external nose and ears  NECK: normal movements of the head and neck  LUNGS: on inspection no signs of respiratory distress, breathing rate appears normal, no obvious gross SOB, gasping or wheezing  CV: no obvious cyanosis  MS: moves all visible extremities without noticeable abnormality  PSYCH/NEURO: pleasant and cooperative, no obvious depression or anxiety, speech and thought processing grossly intact  ASSESSMENT AND PLAN:  Discussed the following assessment and plan:  Restless legs  Primary hypertension  -Her symptoms are suggestive of restless leg syndrome.  She does have history of muscle spasms in the past but this sounds different. -Recommend she try to minimize caffeine intake.  She currently consumes very little caffeine. -Recommend consider trial of Mirapex 0.125 mg 1-2 nightly and set up follow-up in 2 to 3 weeks to reassess.   I discussed the assessment and treatment plan with the patient. The patient was provided an opportunity to ask questions and all were answered. The patient agreed with the plan and demonstrated an understanding of the instructions.   The patient was advised to call back or seek an in-person evaluation if the symptoms worsen or if the condition fails to improve as anticipated.     Evelena Peat, MD

## 2020-02-20 NOTE — Patient Instructions (Signed)

## 2020-03-14 IMAGING — CT CT ANGIO CHEST
3 of 8 series · 17 of 36 positions shown · IV contrast (iopamidol)
Comparison: 04/27/2010

CLINICAL DATA: Pleuritic chest pain for several days

EXAM:
CT ANGIOGRAPHY CHEST WITH CONTRAST
TECHNIQUE: Multidetector CT imaging of the chest was performed using the
standard protocol during bolus administration of intravenous
contrast. Multiplanar CT image reconstructions and MIPs were
obtained to evaluate the vascular anatomy.
CONTRAST:  75mL IAADDU-SI8 IOPAMIDOL (IAADDU-SI8) INJECTION 76%

[Series 7: cta pulmonary 2.00 bv36 s3 · coronal · 0.52mm/px · 1 of 156 slices shown]
[im 78/156  mediastinal]
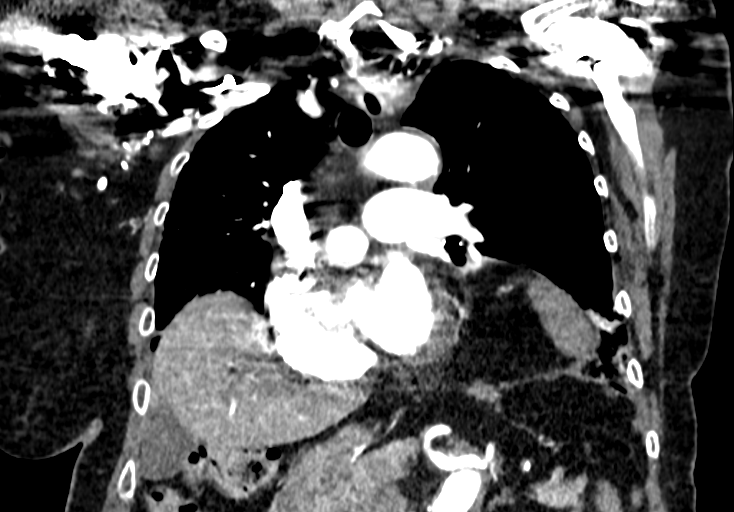

[Series 11: cta pulmonary 1.00 bv36 s3 thins · axial · 0.75mm/px · z∈[+1538,+1737]mm · 7 of 267 slices shown]
[im 34/267  lung]
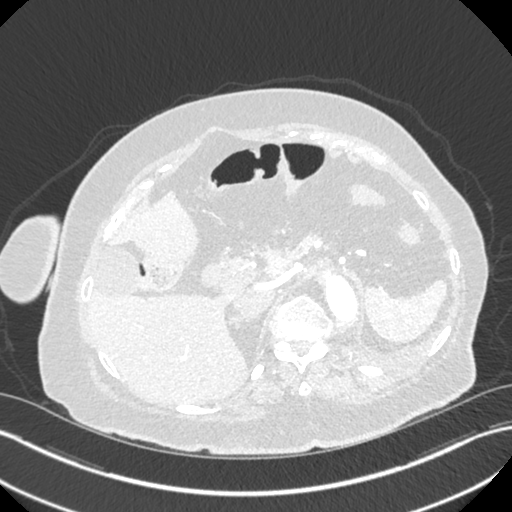
[im 67/267  lung]
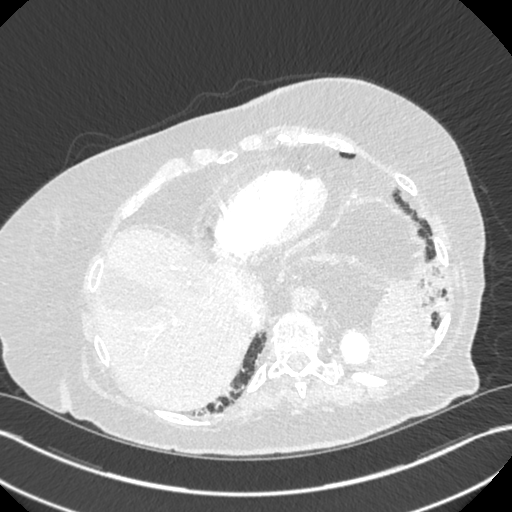
[im 100/267  lung]
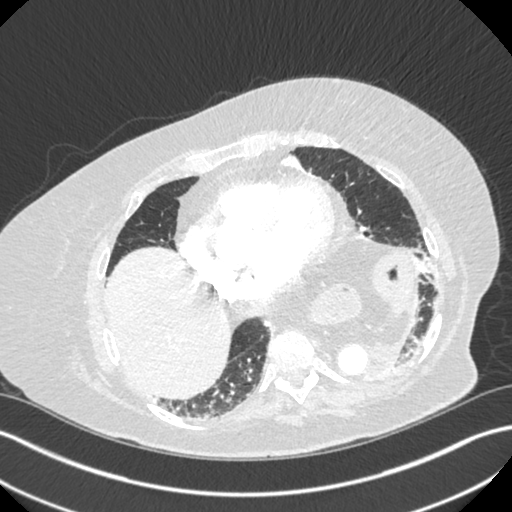
[im 134/267  lung]
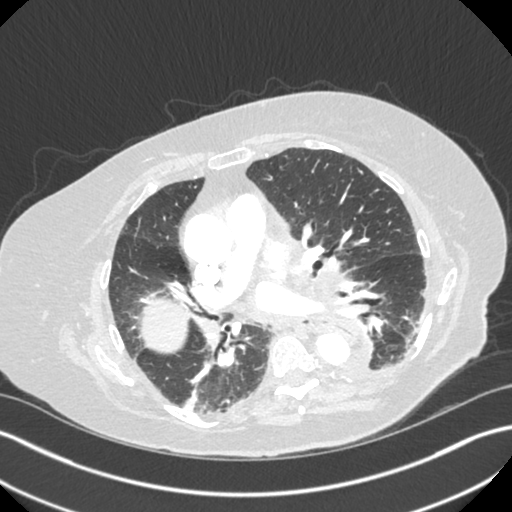
[im 167/267  lung]
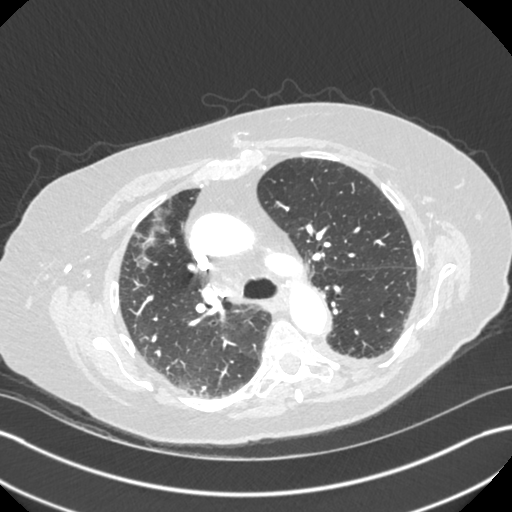
[im 200/267  lung]
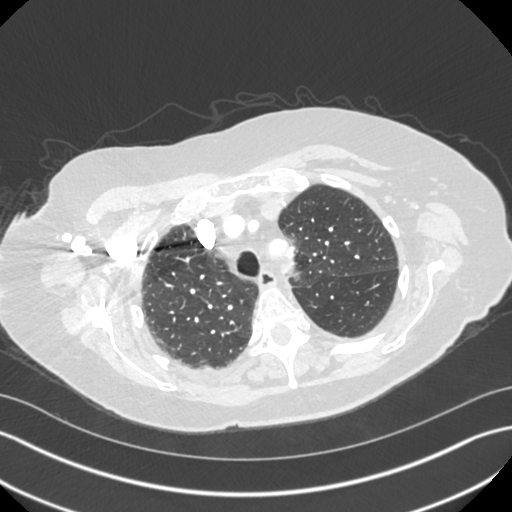
[im 233/267  lung]
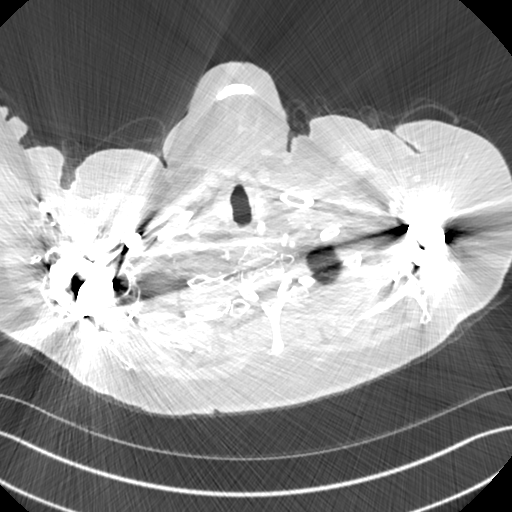

[Series 12: cta pulmonary 1.00 bv36 s3 super d. · axial · 0.75mm/px · z∈[+1531,+1743]mm · 9 of 333 slices shown]
[im 34/333  lung]
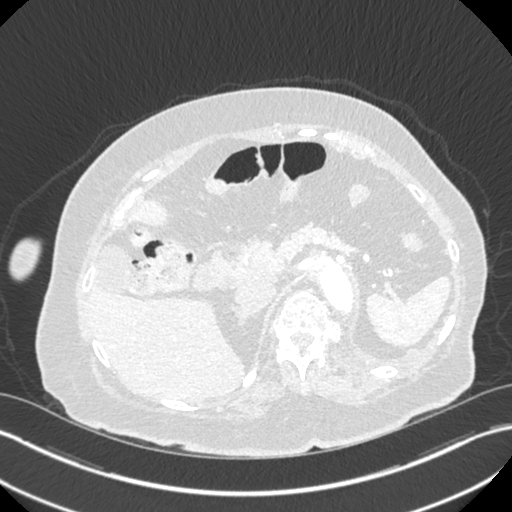
[im 67/333  mediastinal]
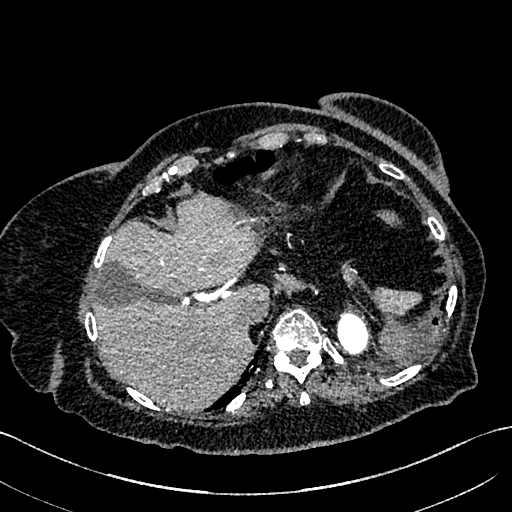
[im 100/333  lung]
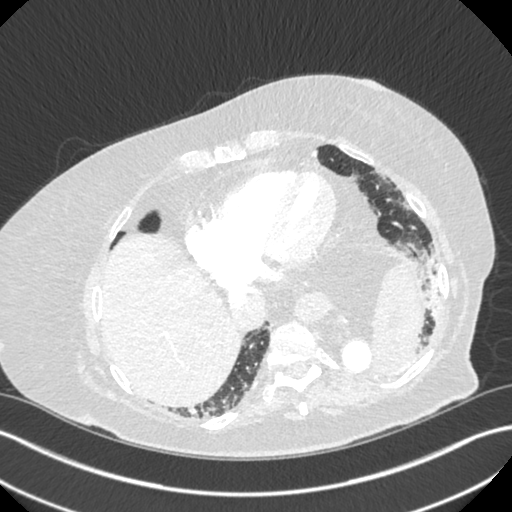
[im 133/333  mediastinal]
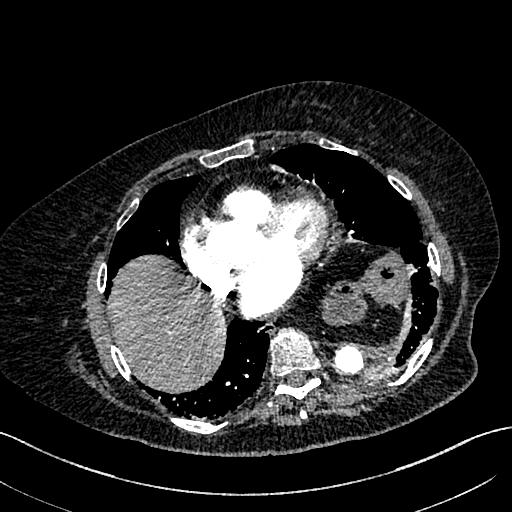
[im 167/333  lung]
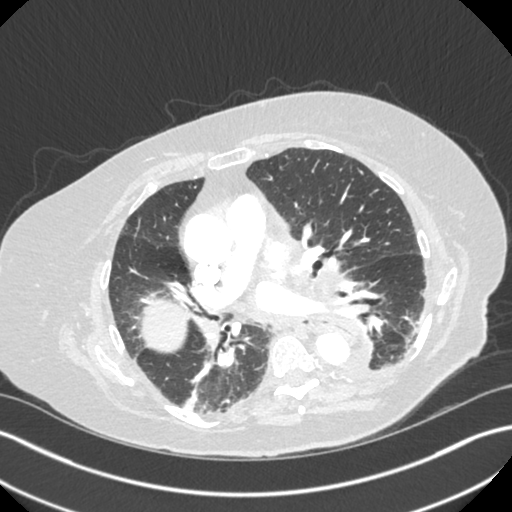
[im 200/333  mediastinal]
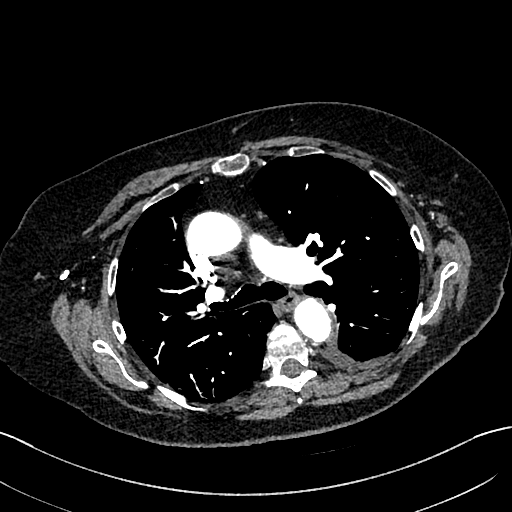
[im 233/333  lung]
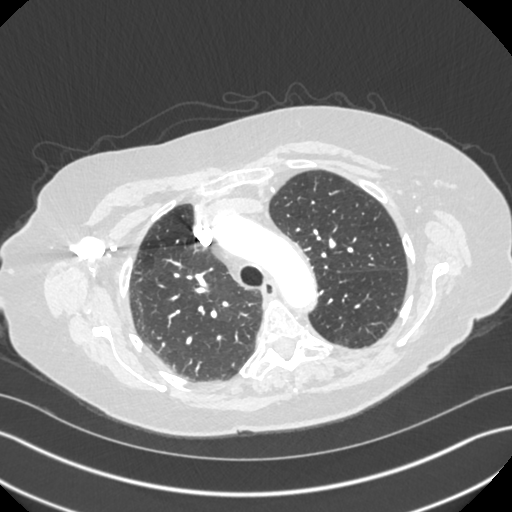
[im 266/333  mediastinal]
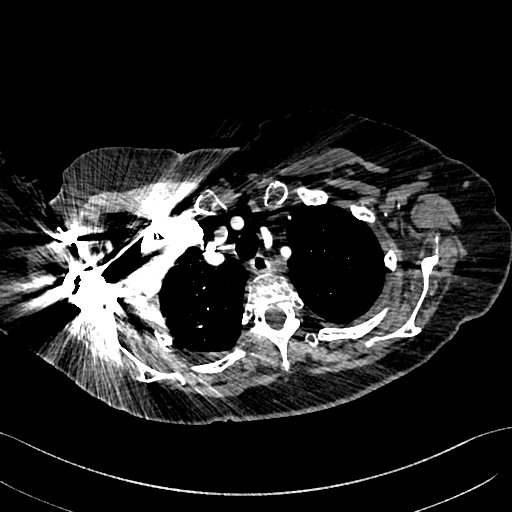
[im 299/333  lung]
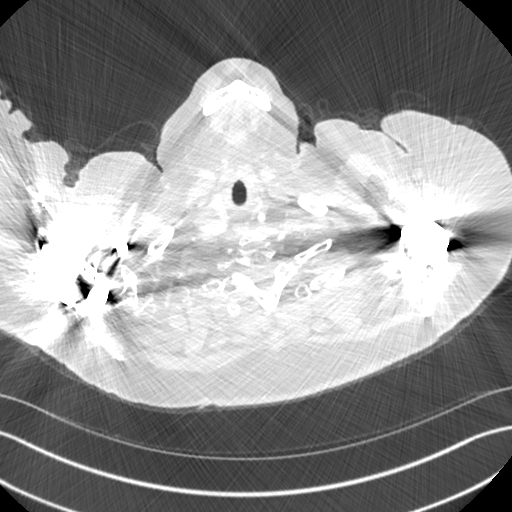

[17 of 36 positions shown; findings below may reference images not displayed]

FINDINGS: Cardiovascular: Thoracic aorta demonstrates atherosclerotic
calcifications without aneurysmal dilatation or dissection coronary
calcifications are noted. The pulmonary artery shows a normal
branching pattern. Scattered small subsegmental pulmonary emboli are
noted in the right upper and lower lobes.

Mediastinum/Nodes: Thoracic inlet is within normal limits. No hilar
or mediastinal adenopathy is noted. The esophagus as visualized is
within normal limits.

Lungs/Pleura: Diffuse emphysematous changes are noted. No focal
infiltrate or sizable effusion is seen. Mild left lower lobe
compressive atelectasis is noted related to the hiatal hernia. No
pneumothorax is noted.

Upper Abdomen: Visualized upper abdomen again demonstrates a large
hiatal hernia. Fatty infiltration of the liver is seen. Few small
gallstones are noted.

Musculoskeletal: Degenerative changes of the thoracic spine are
noted. Postsurgical changes in the shoulders bilaterally is seen.

Review of the MIP images confirms the above findings.
IMPRESSION: Scattered small subsegmental pulmonary emboli are noted within the
right lung. No right heart strain is noted.

Mild left lower lobe compressive atelectasis related to a large
hiatal hernia which is chronic.

Cholelithiasis without complicating factors.

Aortic Atherosclerosis (9U9SK-4EL.L) and Emphysema ([id number]-RFG.H).

These results will be called to the ordering clinician or
representative by the Radiologist Assistant, and communication
documented in the PACS or zVision Dashboard.

## 2020-04-23 NOTE — Progress Notes (Signed)
Subjective:   NILDA KEATHLEY is a 87 y.o. female who presents for Medicare Annual (Subsequent) preventive examination.  Review of Systems    N/A  Cardiac Risk Factors include: advanced age (>49men, >50 women);hypertension;dyslipidemia     Objective:    Today's Vitals   04/24/20 1118  BP: 132/84  Pulse: 73  Temp: 97.7 F (36.5 C)  TempSrc: Oral  SpO2: 94%  Weight: 157 lb 3 oz (71.3 kg)  Height: 4\' 11"  (1.499 m)   Body mass index is 31.75 kg/m.  Advanced Directives 04/24/2020 04/09/2019 01/13/2016 07/22/2011  Does Patient Have a Medical Advance Directive? Yes Yes Yes Patient has advance directive, copy not in chart  Type of Advance Directive [place];Living will Living will;Healthcare Power of [place];Living will  Does patient want to make changes to medical advance directive? No - Patient declined No - Patient declined (No Data) -  Copy of Ava in Chart? No - copy requested No - copy requested - -    Current Medications (verified) Outpatient Encounter Medications as of 04/24/2020  Medication Sig   diphenhydramine-acetaminophen (TYLENOL PM) 25-500 MG TABS tablet Take 1 tablet by mouth at bedtime as needed. Takes 3 tablets at night.   ELIQUIS 5 MG TABS tablet AT THE START OF THERAPY , TAKE 2 TABLETS TWICE A DAY FOR 7 DAYS AND THEN 1 TABLET TWICE A DAY THEREAFTER   hydrochlorothiazide (MICROZIDE) 12.5 MG capsule TAKE 1 CAPSULE DAILY   Multiple Vitamin (MULITIVITAMIN WITH MINERALS) TABS Take 1 tablet by mouth daily.   pramipexole (MIRAPEX) 0.125 MG tablet Take 1 to 2 tablets by mouth at night as needed for restless leg symptoms   traMADol (ULTRAM) 50 MG tablet TAKE 1 TABLET EVERY 12 HOURS AS NEEDED   baclofen (LIORESAL) 10 MG tablet TAKE 1 TABLET TWICE A DAY AS NEEDED FOR MUSCLE SPASMS (Patient not taking: Reported on 04/24/2020)   Docusate Calcium (STOOL SOFTENER PO) Take by mouth.    MELATONIN PO Take 5 mg by mouth at bedtime. (Patient not taking: Reported on 04/24/2020)   No facility-administered encounter medications on file as of 04/24/2020.    Allergies (verified) Patient has no known allergies.   History: Past Medical History:  Diagnosis Date   BACK PAIN, LUMBAR, CHRONIC 02/20/2010   Cardiomegaly 03/16/2010   COLONIC POLYPS 02/20/2010   DIVERTICULITIS, HX OF 02/20/2010   GERD 02/20/2010   GOITER 02/20/2010   HYPERLIPIDEMIA 02/20/2010   OSTEOARTHRITIS, MULTIPLE JOINTS 02/20/2010   URINARY INCONTINENCE 02/20/2010   Past Surgical History:  Procedure Laterality Date   ABDOMINAL HYSTERECTOMY     NOSE SURGERY  1975   head surgery to repair cracked bone over nose    SPINE SURGERY     4 back surgeries   THYROIDECTOMY, PARTIAL     ? goiter, no CA   TONSILLECTOMY  1936   TOTAL SHOULDER ARTHROPLASTY  07/30/2011   Procedure: TOTAL SHOULDER ARTHROPLASTY;  Surgeon: Augustin Schooling, MD;  Location: [facility];  Service: Orthopedics;  Laterality: Right;  Right Reversed Total Shoulder   Family History  Problem Relation Age of Onset   Cancer Mother        ovarian   Hyperlipidemia Mother    Diabetes Mother        type ll   Social History   Socioeconomic History   Marital status: Widowed    Spouse name: Not on file   Number of children: Not on file  Years of education: Not on file   Highest education level: Not on file  Occupational History   Not on file  Tobacco Use   Smoking status: Never Smoker   Smokeless tobacco: Never Used  Vaping Use   Vaping Use: Never used  Substance and Sexual Activity   Alcohol use: No   Drug use: No   Sexual activity: Not on file  Other Topics Concern   Not on file  Social History Narrative   Lives alone   In home assistance throughout day    Social Determinants of Health   Financial Resource Strain: Low Risk    Difficulty of Paying Living Expenses: Not hard at all  Food Insecurity: No Food Insecurity    Worried About Charity fundraiser in the Last Year: Never true   [place] in the Last Year: Never true  Transportation Needs: No Transportation Needs   Lack of Transportation (Medical): No   Lack of Transportation (Non-Medical): No  Physical Activity: Inactive   Days of Exercise per Week: 0 days   Minutes of Exercise per Session: 0 min  Stress: No Stress Concern Present   Feeling of Stress : Not at all  Social Connections: Moderately Isolated   Frequency of Communication with Friends and Family: More than three times a week   Frequency of Social Gatherings with Friends and Family: Three times a week   Attends Religious Services: More than 4 times per year   Active Member of Clubs or Organizations: No   Attends Archivist Meetings: Never   Marital Status: Widowed    Tobacco Counseling Counseling given: Not Answered   Clinical Intake:  Pre-visit preparation completed: Yes  Pain : No/denies pain     Nutritional Risks: None Diabetes: No  How often do you need to have someone help you when you read instructions, pamphlets, or other written materials from your doctor or pharmacy?: 1 - Never  Diabetic?No   Interpreter Needed?: No  Information entered by :: [place] of Daily Living In your present state of health, do you have any difficulty performing the following activities: 04/24/2020  Hearing? Y  Comment has bilateral hearing aids  Vision? N  Difficulty concentrating or making decisions? N  Walking or climbing stairs? Y  Dressing or bathing? Y  Doing errands, shopping? Y  Preparing Food and eating ? Y  Comment does not prepare foods  Using the Toilet? Y  In the past six months, have you accidently leaked urine? Y  Do you have problems with loss of bowel control? Y  Managing your Medications? Y  Managing your Finances? Y  Housekeeping or managing your Housekeeping? Y  Some recent data might be hidden    Patient Care  Team: Eulas Post, MD as PCP - General  Indicate any recent Medical Services you may have received from other than [facility] providers in the past year (date may be approximate).     Assessment:   This is a routine wellness examination for Shamariah.  Hearing/Vision screen  Hearing Screening   125Hz  250Hz  500Hz  1000Hz  2000Hz  3000Hz  4000Hz  6000Hz  8000Hz   Right ear:           Left ear:           Vision Screening Comments: Has not had an eye exam in a while. Wears reading glasses   Dietary issues and exercise activities discussed: Current Exercise Habits: The patient does not participate in regular exercise at  present, Exercise limited by: Other - see comments (mobility issues)  Goals     patient     Keep exercising in the home. Walk around every 2 hours ; get up and down in chair  Deep breath every day 5 to 10 times      Patient Stated     I just want to maintain current health      Depression Screen PHQ 2/9 Scores 04/24/2020 04/09/2019 08/21/2018 05/07/2016 08/23/2014  PHQ - 2 Score 0 0 0 0 0    Fall Risk Fall Risk  04/24/2020 04/09/2019 01/08/2019 12/30/2017 09/19/2017  Falls in the past year? 0 0 0 0 No  Comment - - [facility]i Telephone Survey: data to providers prior to load [employer] Telephone Survey: data to providers prior to load [facility]i Telephone Survey: data to providers prior to load  Number falls in past yr: 0 0 - - -  Injury with Fall? 0 0 - - -  Risk for fall due to : Impaired balance/gait;Impaired mobility History of fall(s);Impaired balance/gait;Impaired mobility - - -  Follow up Falls prevention discussed;Falls evaluation completed Education provided;Falls evaluation completed;Falls prevention discussed - - -    FALL RISK PREVENTION PERTAINING TO THE HOME:  Any stairs in or around the home? Yes  If so, are there any without handrails? No  Home free of loose throw rugs in walkways, pet beds, electrical cords, etc? Yes  Adequate lighting in your home to reduce risk of falls?  Yes   ASSISTIVE DEVICES UTILIZED TO PREVENT FALLS:  Life alert? Yes  Use of a cane, walker or w/c? Yes  Grab bars in the bathroom? Yes  Shower chair or bench in shower? Yes  Elevated toilet seat or a handicapped toilet? No     Cognitive Function:  Normal cognitive status assessed by direct observation by this Nurse Health Advisor. No abnormalities found.       6CIT Screen 04/09/2019 01/13/2016  What Year? 0 points 0 points  What month? 0 points 0 points  What time? 0 points 0 points  Count back from 20 0 points 0 points  Months in reverse 0 points 0 points  Repeat phrase 2 points 0 points  Total Score 2 0    Immunizations Immunization History  Administered Date(s) Administered   Fluad Quad(high Dose 65+) 12/12/2018   Influenza Whole 02/20/2010, 10/27/2010   Influenza, High Dose Seasonal PF 12/31/2015, 12/16/2017   Influenza,inj,Quad PF,6+ Mos 10/29/2013   Influenza-Unspecified 11/15/2017, 11/30/2019   PFIZER(Purple Top)SARS-COV-2 Vaccination 03/02/2019, 03/23/2019   Pneumococcal Conjugate-13 04/20/2017   Pneumococcal Polysaccharide-23 02/16/2008    TDAP status: Due, Education has been provided regarding the importance of this vaccine. Advised may receive this vaccine at local pharmacy or Health Dept. Aware to provide a copy of the vaccination record if obtained from local pharmacy or Health Dept. Verbalized acceptance and understanding.  Flu Vaccine status: Up to date  Pneumococcal vaccine status: Up to date  Covid-19 vaccine status: Completed vaccines  Qualifies for Shingles Vaccine? Yes   Zostavax completed No   Shingrix Completed?: No.    Education has been provided regarding the importance of this vaccine. Patient has been advised to call insurance company to determine out of pocket expense if they have not yet received this vaccine. Advised may also receive vaccine at local pharmacy or Health Dept. Verbalized acceptance and understanding.  Screening  Tests Health Maintenance  Topic Date Due   TETANUS/TDAP  Never done   COVID-19 Vaccine (3 - Booster  for Nixon series) 09/20/2019   INFLUENZA VACCINE  Completed   PNA vac Low Risk Adult  Completed   HPV VACCINES  Aged Out   DEXA SCAN  Discontinued    Health Maintenance  Health Maintenance Due  Topic Date Due   TETANUS/TDAP  Never done   COVID-19 Vaccine (3 - Booster for Pfizer series) 09/20/2019    Colorectal cancer screening: No longer required.   Mammogram status: No longer required due to age.  Bone Density Status: no longer required.   Lung Cancer Screening: (Low Dose CT Chest recommended if Age 47-80 years, 30 pack-year currently smoking OR have quit w/in 15years.) does not qualify.   Lung Cancer Screening Referral: N/A   Additional Screening:  Hepatitis C Screening: does not qualify;   Vision Screening: Recommended annual ophthalmology exams for early detection of glaucoma and other disorders of the eye. Is the patient up to date with their annual eye exam?  No  Who is the provider or what is the name of the office in which the patient attends annual eye exams? Does not remember eye doctor  If pt is not established with a provider, would they like to be referred to a provider to establish care? No .   Dental Screening: Recommended annual dental exams for proper oral hygiene  Community Resource Referral / Chronic Care Management: CRR required this visit?  No   CCM required this visit?  No      Plan:     I have personally reviewed and noted the following in the patients chart:    Medical and social history  Use of alcohol, tobacco or illicit drugs   Current medications and supplements  Functional ability and status  Nutritional status  Physical activity  Advanced directives  List of other physicians  Hospitalizations, surgeries, and ER visits in previous 12 months  Vitals  Screenings to include cognitive, depression, and  falls  Referrals and appointments  In addition, I have reviewed and discussed with patient certain preventive protocols, quality metrics, and best practice recommendations. A written personalized care plan for preventive services as well as general preventive health recommendations were provided to patient.     Ofilia Neas, LPN   8/84/1660   Nurse Notes: None

## 2020-04-24 ENCOUNTER — Other Ambulatory Visit: Payer: Self-pay

## 2020-04-24 ENCOUNTER — Ambulatory Visit (INDEPENDENT_AMBULATORY_CARE_PROVIDER_SITE_OTHER): Payer: Medicare Other

## 2020-04-24 VITALS — BP 132/84 | HR 73 | Temp 97.7°F | Ht 59.0 in | Wt 157.2 lb

## 2020-04-24 DIAGNOSIS — Z0000 Encounter for general adult medical examination without abnormal findings: Secondary | ICD-10-CM | POA: Diagnosis not present

## 2020-04-24 NOTE — Patient Instructions (Addendum)
[name] , Thank you for taking time to come for your Medicare Wellness Visit. I appreciate your ongoing commitment to your health goals. Please review the following plan we discussed and let me know if I can assist you in the future.   Screening recommendations/referrals: Colonoscopy: No longer required  Mammogram: No longer required  Bone Density: No longer required  Recommended yearly ophthalmology/optometry visit for glaucoma screening and checkup Recommended yearly dental visit for hygiene and checkup  Vaccinations: Influenza vaccine: Up to date, next due fall 2022  Pneumococcal vaccine: Completed series Tdap vaccine: Currently due, you may await a minor injury as it will be covered at that time by medicare Shingles vaccine: Currently due for Shingrix, if you would like to receive we recommend that you do so at your local pharmacy as it is less expensive     Advanced directives: Please bring in a copy of your advanced medical directives so that we may scan into your chart.   Conditions/risks identified: None   Next appointment: As requested we will call you closer to the time that your next medicare wellness visit is due so that we may get you scheduled.    Preventive Care 44 Years and Older, Female Preventive care refers to lifestyle choices and visits with your health care provider that can promote health and wellness. What does preventive care include?  A yearly physical exam. This is also called an annual well check.  Dental exams once or twice a year.  Routine eye exams. Ask your health care provider how often you should have your eyes checked.  Personal lifestyle choices, including:  Daily care of your teeth and gums.  Regular physical activity.  Eating a healthy diet.  Avoiding tobacco and drug use.  Limiting alcohol use.  Practicing safe sex.  Taking low-dose aspirin every day.  Taking vitamin and mineral supplements as recommended by your health  care provider. What happens during an annual well check? The services and screenings done by your health care provider during your annual well check will depend on your age, overall health, lifestyle risk factors, and family history of disease. Counseling  Your health care provider may ask you questions about your:  Alcohol use.  Tobacco use.  Drug use.  Emotional well-being.  Home and relationship well-being.  Sexual activity.  Eating habits.  History of falls.  Memory and ability to understand (cognition).  Work and work Statistician.  Reproductive health. Screening  You may have the following tests or measurements:  Height, weight, and BMI.  Blood pressure.  Lipid and cholesterol levels. These may be checked every 5 years, or more frequently if you are over 53 years old.  Skin check.  Lung cancer screening. You may have this screening every year starting at age 8 if you have a 30-pack-year history of smoking and currently smoke or have quit within the past 15 years.  Fecal occult blood test (FOBT) of the stool. You may have this test every year starting at age 20.  Flexible sigmoidoscopy or colonoscopy. You may have a sigmoidoscopy every 5 years or a colonoscopy every 10 years starting at age 49.  Hepatitis C blood test.  Hepatitis B blood test.  Sexually transmitted disease (STD) testing.  Diabetes screening. This is done by checking your blood sugar (glucose) after you have not eaten for a while (fasting). You may have this done every 1-3 years.  Bone density scan. This is done to screen for osteoporosis. You may have this  done starting at age 37.  Mammogram. This may be done every 1-2 years. Talk to your health care provider about how often you should have regular mammograms. Talk with your health care provider about your test results, treatment options, and if necessary, the need for more tests. Vaccines  Your health care provider may recommend certain  vaccines, such as:  Influenza vaccine. This is recommended every year.  Tetanus, diphtheria, and acellular pertussis (Tdap, Td) vaccine. You may need a Td booster every 10 years.  Zoster vaccine. You may need this after age 67.  Pneumococcal 13-valent conjugate (PCV13) vaccine. One dose is recommended after age 52.  Pneumococcal polysaccharide (PPSV23) vaccine. One dose is recommended after age 37. Talk to your health care provider about which screenings and vaccines you need and how often you need them. This information is not intended to replace advice given to you by your health care provider. Make sure you discuss any questions you have with your health care provider. Document Released: 02/28/2015 Document Revised: 10/22/2015 Document Reviewed: 12/03/2014 Elsevier Interactive Patient Education  2017 [place] Prevention in the Home Falls can cause injuries. They can happen to people of all ages. There are many things you can do to make your home safe and to help prevent falls. What can I do on the outside of my home?  Regularly fix the edges of walkways and driveways and fix any cracks.  Remove anything that might make you trip as you walk through a door, such as a raised step or threshold.  Trim any bushes or trees on the path to your home.  Use bright outdoor lighting.  Clear any walking paths of anything that might make someone trip, such as rocks or tools.  Regularly check to see if handrails are loose or broken. Make sure that both sides of any steps have handrails.  Any raised decks and porches should have guardrails on the edges.  Have any leaves, snow, or ice cleared regularly.  Use sand or salt on walking paths during winter.  Clean up any spills in your garage right away. This includes oil or grease spills. What can I do in the bathroom?  Use night lights.  Install grab bars by the toilet and in the tub and shower. Do not use towel bars as grab  bars.  Use non-skid mats or decals in the tub or shower.  If you need to sit down in the shower, use a plastic, non-slip stool.  Keep the floor dry. Clean up any water that spills on the floor as soon as it happens.  Remove soap buildup in the tub or shower regularly.  Attach bath mats securely with double-sided non-slip rug tape.  Do not have throw rugs and other things on the floor that can make you trip. What can I do in the bedroom?  Use night lights.  Make sure that you have a light by your bed that is easy to reach.  Do not use any sheets or blankets that are too big for your bed. They should not hang down onto the floor.  Have a firm chair that has side arms. You can use this for support while you get dressed.  Do not have throw rugs and other things on the floor that can make you trip. What can I do in the kitchen?  Clean up any spills right away.  Avoid walking on wet floors.  Keep items that you use a lot in easy-to-reach places.  If you need to reach something above you, use a strong step stool that has a grab bar.  Keep electrical cords out of the way.  Do not use floor polish or wax that makes floors slippery. If you must use wax, use non-skid floor wax.  Do not have throw rugs and other things on the floor that can make you trip. What can I do with my stairs?  Do not leave any items on the stairs.  Make sure that there are handrails on both sides of the stairs and use them. Fix handrails that are broken or loose. Make sure that handrails are as long as the stairways.  Check any carpeting to make sure that it is firmly attached to the stairs. Fix any carpet that is loose or worn.  Avoid having throw rugs at the top or bottom of the stairs. If you do have throw rugs, attach them to the floor with carpet tape.  Make sure that you have a light switch at the top of the stairs and the bottom of the stairs. If you do not have them, ask someone to add them for  you. What else can I do to help prevent falls?  Wear shoes that:  Do not have high heels.  Have rubber bottoms.  Are comfortable and fit you well.  Are closed at the toe. Do not wear sandals.  If you use a stepladder:  Make sure that it is fully opened. Do not climb a closed stepladder.  Make sure that both sides of the stepladder are locked into place.  Ask someone to hold it for you, if possible.  Clearly mark and make sure that you can see:  Any grab bars or handrails.  First and last steps.  Where the edge of each step is.  Use tools that help you move around (mobility aids) if they are needed. These include:  Canes.  Walkers.  Scooters.  Crutches.  Turn on the lights when you go into a dark area. Replace any light bulbs as soon as they burn out.  Set up your furniture so you have a clear path. Avoid moving your furniture around.  If any of your floors are uneven, fix them.  If there are any pets around you, be aware of where they are.  Review your medicines with your doctor. Some medicines can make you feel dizzy. This can increase your chance of falling. Ask your doctor what other things that you can do to help prevent falls. This information is not intended to replace advice given to you by your health care provider. Make sure you discuss any questions you have with your health care provider. Document Released: 11/28/2008 Document Revised: 07/10/2015 Document Reviewed: 03/08/2014 Elsevier Interactive Patient Education  2017 [employer].

## 2020-07-10 ENCOUNTER — Telehealth: Payer: Self-pay | Admitting: Family Medicine

## 2020-07-10 NOTE — Telephone Encounter (Signed)
Last office visit-02-20-20 Last refill- 01/03/20-90 tabs, 1 refill  No future visit has been scheduled

## 2020-07-10 NOTE — Telephone Encounter (Signed)
The  pt requesting  a refill  traMADol (ULTRAM) 50 MG tablet  [id number] - [place], [place] - [address]  Phone:  [phone] Fax:  [phone]

## 2020-07-11 MED ORDER — TRAMADOL HCL 50 MG PO TABS: ORAL_TABLET | ORAL | 0 refills | Status: DC

## 2020-07-11 NOTE — Telephone Encounter (Signed)
Refilled once but she is overdue for follow up labs and office follow up.

## 2020-07-11 NOTE — Telephone Encounter (Signed)
Called spoke with patient, she will call office to schedule an appointment when her caregiver is at home with her.

## 2020-07-23 NOTE — Telephone Encounter (Signed)
Called spoke with patients caregiver Ms. Nicholaus Bloom.  In office appointment scheduled for 08/04/20.

## 2020-08-04 ENCOUNTER — Encounter: Payer: Self-pay | Admitting: Family Medicine

## 2020-08-04 ENCOUNTER — Ambulatory Visit (INDEPENDENT_AMBULATORY_CARE_PROVIDER_SITE_OTHER): Payer: Medicare Other | Admitting: Family Medicine

## 2020-08-04 ENCOUNTER — Other Ambulatory Visit: Payer: Self-pay

## 2020-08-04 VITALS — BP 128/62 | HR 73 | Temp 97.7°F

## 2020-08-04 DIAGNOSIS — Z79899 Other long term (current) drug therapy: Secondary | ICD-10-CM | POA: Diagnosis not present

## 2020-08-04 DIAGNOSIS — I2694 Multiple subsegmental pulmonary emboli without acute cor pulmonale: Secondary | ICD-10-CM

## 2020-08-04 DIAGNOSIS — G2581 Restless legs syndrome: Secondary | ICD-10-CM | POA: Diagnosis not present

## 2020-08-04 DIAGNOSIS — I10 Essential (primary) hypertension: Secondary | ICD-10-CM

## 2020-08-04 DIAGNOSIS — L989 Disorder of the skin and subcutaneous tissue, unspecified: Secondary | ICD-10-CM | POA: Diagnosis not present

## 2020-08-04 DIAGNOSIS — M159 Polyosteoarthritis, unspecified: Secondary | ICD-10-CM

## 2020-08-04 DIAGNOSIS — M545 Low back pain, unspecified: Secondary | ICD-10-CM | POA: Diagnosis not present

## 2020-08-04 DIAGNOSIS — M8949 Other hypertrophic osteoarthropathy, multiple sites: Secondary | ICD-10-CM | POA: Diagnosis not present

## 2020-08-04 DIAGNOSIS — G8929 Other chronic pain: Secondary | ICD-10-CM | POA: Diagnosis not present

## 2020-08-04 LAB — BASIC METABOLIC PANEL
BUN: 14 mg/dL (ref 6–23)
CO2: 26 mEq/L (ref 19–32)
Calcium: 9 mg/dL (ref 8.4–10.5)
Chloride: 101 mEq/L (ref 96–112)
Creatinine, Ser: 0.65 mg/dL (ref 0.40–1.20)
GFR: 77.31 mL/min (ref >60.00)
Glucose, Bld: 94 mg/dL (ref 70–99)
Potassium: 3.7 mEq/L (ref 3.5–5.1)
Sodium: 136 mEq/L (ref 135–145)

## 2020-08-04 LAB — CBC WITH DIFFERENTIAL/PLATELET
Basophils Absolute: 0.1 10*3/uL (ref 0.0–0.1)
Basophils Relative: 1 % (ref 0.0–3.0)
Eosinophils Absolute: 0.1 10*3/uL (ref 0.0–0.7)
Eosinophils Relative: 2.7 % (ref 0.0–5.0)
HCT: 31.6 % — ABNORMAL LOW (ref 36.0–46.0)
Hemoglobin: 10.2 g/dL — ABNORMAL LOW (ref 12.0–15.0)
Lymphocytes Relative: 36 % (ref 12.0–46.0)
Lymphs Abs: 1.8 10*3/uL (ref 0.7–4.0)
MCHC: 32.3 g/dL (ref 30.0–36.0)
MCV: 72.6 fl — ABNORMAL LOW (ref 78.0–100.0)
Monocytes Absolute: 0.7 10*3/uL (ref 0.1–1.0)
Monocytes Relative: 13.3 % — ABNORMAL HIGH (ref 3.0–12.0)
Neutro Abs: 2.4 10*3/uL (ref 1.4–7.7)
Neutrophils Relative %: 47 % (ref 43.0–77.0)
Platelets: 399 10*3/uL (ref 150.0–400.0)
RBC: 4.35 Mil/uL (ref 3.87–5.11)
RDW: 16 % — ABNORMAL HIGH (ref 11.5–15.5)
WBC: 5.1 10*3/uL (ref 4.0–10.5)

## 2020-08-04 NOTE — Patient Instructions (Signed)
We will set up dermatology referral to evaluate right leg lesion.

## 2020-08-04 NOTE — Progress Notes (Signed)
Established Patient Office Visit  Subjective:  Patient ID: [name], female    DOB: [birth year]  Age: 87 y.o. MRN: [id number]  CC:  Chief Complaint  Patient presents with   medication follow up    HPI [name] presents for medical follow-up.  She turned 89 this year.  Generally doing well.  No specific complaints today.  She has caregivers that help throughout the day.  Her chronic problems include history of hypertension, history of recurrent pulmonary emboli, osteoarthritis involving multiple joints, chronic lumbar back pain, probable restless leg syndrome.  She has chronic insomnia.  Medications reviewed.  She remains on Eliquis, HCTZ, tramadol 1 at night, baclofen 1 twice daily as needed for muscle spasms, and Mirapex 0.125 mg 1-2 at night for restless leg symptoms.  She apparently is also taking Tylenol PM and has done so for quite some time for insomnia.  She has caregiver with her at night.  Denies any current medication issues.  Patient has right anterior leg lesion which apparently been present for at least several months.  They have been applying topicals such as Neosporin without improvement.  No known personal history of skin cancer.  Past Medical History:  Diagnosis Date   BACK PAIN, LUMBAR, CHRONIC 02/20/2010   Cardiomegaly 03/16/2010   COLONIC POLYPS 02/20/2010   DIVERTICULITIS, HX OF 02/20/2010   GERD 02/20/2010   GOITER 02/20/2010   HYPERLIPIDEMIA 02/20/2010   OSTEOARTHRITIS, MULTIPLE JOINTS 02/20/2010   URINARY INCONTINENCE 02/20/2010    Past Surgical History:  Procedure Laterality Date   ABDOMINAL HYSTERECTOMY     NOSE SURGERY  1975   head surgery to repair cracked bone over nose    SPINE SURGERY     4 back surgeries   THYROIDECTOMY, PARTIAL     ? goiter, no CA   TONSILLECTOMY  1936   TOTAL SHOULDER ARTHROPLASTY  07/30/2011   Procedure: TOTAL SHOULDER ARTHROPLASTY;  Surgeon: Augustin Schooling, MD;  Location: [facility];  Service: Orthopedics;  Laterality:  Right;  Right Reversed Total Shoulder    Family History  Problem Relation Age of Onset   Cancer Mother        ovarian   Hyperlipidemia Mother    Diabetes Mother        type ll    Social History   Socioeconomic History   Marital status: Widowed    Spouse name: Not on file   Number of children: Not on file   Years of education: Not on file   Highest education level: Not on file  Occupational History   Not on file  Tobacco Use   Smoking status: Never   Smokeless tobacco: Never  Vaping Use   Vaping Use: Never used  Substance and Sexual Activity   Alcohol use: No   Drug use: No   Sexual activity: Not on file  Other Topics Concern   Not on file  Social History Narrative   Lives alone   In home assistance throughout day    Social Determinants of Health   Financial Resource Strain: Low Risk    Difficulty of Paying Living Expenses: Not hard at all  Food Insecurity: No Food Insecurity   Worried About Charity fundraiser in the Last Year: Never true   [place] in the Last Year: Never true  Transportation Needs: No Transportation Needs   Lack of Transportation (Medical): No   Lack of Transportation (Non-Medical): No  Physical Activity: Inactive   Days  of Exercise per Week: 0 days   Minutes of Exercise per Session: 0 min  Stress: No Stress Concern Present   Feeling of Stress : Not at all  Social Connections: Moderately Isolated   Frequency of Communication with Friends and Family: More than three times a week   Frequency of Social Gatherings with Friends and Family: Three times a week   Attends Religious Services: More than 4 times per year   Active Member of Clubs or Organizations: No   Attends Archivist Meetings: Never   Marital Status: Widowed  Human resources officer Violence: Not At Risk   Fear of Current or Ex-Partner: No   Emotionally Abused: No   Physically Abused: No   Sexually Abused: No    Outpatient Medications Prior to Visit  Medication  Sig Dispense Refill   baclofen (LIORESAL) 10 MG tablet TAKE 1 TABLET TWICE A DAY AS NEEDED FOR MUSCLE SPASMS 60 tablet 11   diphenhydramine-acetaminophen (TYLENOL PM) 25-500 MG TABS tablet Take 1 tablet by mouth at bedtime as needed. Takes 3 tablets at night.     Docusate Calcium (STOOL SOFTENER PO) Take by mouth.     ELIQUIS 5 MG TABS tablet AT THE START OF THERAPY , TAKE 2 TABLETS TWICE A DAY FOR 7 DAYS AND THEN 1 TABLET TWICE A DAY THEREAFTER 180 tablet 3   hydrochlorothiazide (MICROZIDE) 12.5 MG capsule TAKE 1 CAPSULE DAILY 90 capsule 3   MELATONIN PO Take 5 mg by mouth at bedtime.     Multiple Vitamin (MULITIVITAMIN WITH MINERALS) TABS Take 1 tablet by mouth daily.     pramipexole (MIRAPEX) 0.125 MG tablet Take 1 to 2 tablets by mouth at night as needed for restless leg symptoms 60 tablet 3   traMADol (ULTRAM) 50 MG tablet TAKE 1 TABLET EVERY 12 HOURS AS NEEDED 90 tablet 0   No facility-administered medications prior to visit.    No Known Allergies  ROS Review of Systems  Constitutional:  Negative for chills, fatigue, fever and unexpected weight change.  Eyes:  Negative for visual disturbance.  Respiratory:  Negative for cough, chest tightness, shortness of breath and wheezing.   Cardiovascular:  Negative for chest pain, palpitations and leg swelling.  Gastrointestinal:  Negative for abdominal pain.  Genitourinary:  Negative for dysuria.  Neurological:  Negative for dizziness, seizures, syncope, weakness, light-headedness and headaches.     Objective:    Physical Exam Vitals reviewed.  Constitutional:      Appearance: Normal appearance.  Cardiovascular:     Rate and Rhythm: Normal rate and regular rhythm.  Pulmonary:     Effort: Pulmonary effort is normal.     Comments: She does have some crackles in both bases which seem to clear slightly with deep breathing.  No wheezes. Musculoskeletal:     Comments: No pitting edema legs and ankles  Skin:    Comments: She has nodular  lesion right lower leg which is approxi-1 cm diameter with scab surface.  Neurological:     Mental Status: She is alert.    BP 128/62 (BP Location: Left Arm, Patient Position: Sitting, Cuff Size: Normal)   Pulse 73   Temp 97.7 F (36.5 C) (Oral)   SpO2 93%  Wt Readings from Last 3 Encounters:  04/24/20 157 lb 3 oz (71.3 kg)  08/21/18 159 lb 6.4 oz (72.3 kg)  04/20/17 151 lb (68.5 kg)     Health Maintenance Due  Topic Date Due   TETANUS/TDAP  Never done  Zoster Vaccines- Shingrix (1 of 2) Never done   COVID-19 Vaccine (3 - Booster for Pfizer series) 08/20/2019    There are no preventive care reminders to display for this patient.  Lab Results  Component Value Date   TSH 1.29 01/11/2011   Lab Results  Component Value Date   WBC 8.2 11/14/2018   HGB 14.5 11/14/2018   HCT 44.0 11/14/2018   MCV 87.1 11/14/2018   PLT 278.0 11/14/2018   Lab Results  Component Value Date   NA 130 (L) 12/12/2018   K 3.7 12/12/2018   CO2 30 12/12/2018   GLUCOSE 101 (H) 12/12/2018   BUN 15 12/12/2018   CREATININE 0.55 12/12/2018   BILITOT 0.5 11/14/2018   ALKPHOS 59 11/14/2018   AST 21 11/14/2018   ALT 17 11/14/2018   PROT 7.0 11/14/2018   ALBUMIN 4.2 11/14/2018   CALCIUM 9.8 12/12/2018   GFR 104.04 12/12/2018   Lab Results  Component Value Date   CHOL 236 (H) 10/29/2013   Lab Results  Component Value Date   HDL 38.40 (L) 10/29/2013   Lab Results  Component Value Date   LDLCALC 159 (H) 10/29/2013   Lab Results  Component Value Date   TRIG 192.0 (H) 10/29/2013   Lab Results  Component Value Date   CHOLHDL 6 10/29/2013   No results found for: HGBA1C    Assessment & Plan:   #1 hypertension stable by today's reading.  Patient on HCTZ -Continue HCTZ 12.5 mg daily -Check basic metabolic panel  #2 history of recurrent DVT/pulmonary emboli.  Patient on Eliquis 5 mg twice daily -Check basic metabolic panel and CBC -She had recurrence of pulmonary emboli and  recommended lifelong therapy  #3 history of chronic insomnia and restless leg symptoms -Continue low-dose Mirapex -We discussed cautions with anticholinergic such as Tylenol PM  #4 nodular lesion right leg.  We shared our concern for possible skin cancer such as squamous cell carcinoma. -Recommend dermatology referral and we will set up with skin surgery center.   No orders of the defined types were placed in this encounter.   Follow-up: Return in about 6 months (around 02/03/2021).    Carolann Littler, MD

## 2020-08-06 ENCOUNTER — Other Ambulatory Visit: Payer: Self-pay

## 2020-08-06 DIAGNOSIS — D649 Anemia, unspecified: Secondary | ICD-10-CM

## 2020-08-15 ENCOUNTER — Other Ambulatory Visit: Payer: Self-pay | Admitting: Family Medicine

## 2020-08-20 ENCOUNTER — Other Ambulatory Visit: Payer: Self-pay

## 2020-08-20 ENCOUNTER — Other Ambulatory Visit (INDEPENDENT_AMBULATORY_CARE_PROVIDER_SITE_OTHER): Payer: Medicare Other

## 2020-08-20 DIAGNOSIS — D649 Anemia, unspecified: Secondary | ICD-10-CM

## 2020-08-21 DIAGNOSIS — D485 Neoplasm of uncertain behavior of skin: Secondary | ICD-10-CM | POA: Diagnosis not present

## 2020-08-21 DIAGNOSIS — C44712 Basal cell carcinoma of skin of right lower limb, including hip: Secondary | ICD-10-CM | POA: Diagnosis not present

## 2020-08-21 LAB — IRON,TIBC AND FERRITIN PANEL
%SAT: 3 % (calc) — ABNORMAL LOW (ref 16–45)
Ferritin: 7 ng/mL — ABNORMAL LOW (ref 16–288)
Iron: 12 ug/dL — ABNORMAL LOW (ref 45–160)
TIBC: 376 mcg/dL (calc) (ref 250–450)

## 2020-08-22 ENCOUNTER — Telehealth: Payer: Self-pay | Admitting: Family Medicine

## 2020-08-22 NOTE — Telephone Encounter (Signed)
Kennis Carina is returning Lindsay's call in reference to [name].  Please contact [name] at [phone]

## 2020-08-22 NOTE — Telephone Encounter (Signed)
Patient returning Lindsay's call for lab results

## 2020-08-22 NOTE — Telephone Encounter (Signed)
Spoke with [name] I discuss the results with her. She is aware a follow up appointment has been made. Nothing further needed.

## 2020-08-22 NOTE — Telephone Encounter (Signed)
See result notes. 

## 2020-08-29 ENCOUNTER — Other Ambulatory Visit: Payer: Self-pay

## 2020-08-29 ENCOUNTER — Ambulatory Visit (INDEPENDENT_AMBULATORY_CARE_PROVIDER_SITE_OTHER): Payer: Medicare Other | Admitting: Family Medicine

## 2020-08-29 ENCOUNTER — Encounter: Payer: Self-pay | Admitting: Family Medicine

## 2020-08-29 VITALS — BP 124/62 | HR 80 | Temp 97.6°F

## 2020-08-29 DIAGNOSIS — D509 Iron deficiency anemia, unspecified: Secondary | ICD-10-CM

## 2020-08-29 NOTE — Progress Notes (Signed)
Established Patient Office Visit  Subjective:  Patient ID: [name], female    DOB: 05-04-1927  Age: 87 y.o. MRN: [id number]  CC:  Chief Complaint  Patient presents with   Follow-up    Follow up on labs    HPI [name] presents for recent abnormal labs with anemia.  She has chronic problems including history of hypertension, past history of pulmonary emboli, chronic Eliquis therapy, osteoarthritis, hyperlipidemia, restless leg syndrome.  We had seen her recently and obtain some follow-up labs.  Significant for hemoglobin 10.2 with MCV of 72.  This compared with prior hemoglobin of 14.5 back in 2020 with MCV of 87.  We then obtained further labs including ferritin of 7, iron saturation 3% and iron of 12.  TIBC was normal at 376.  Patient has not noted any obvious bloody stools.  Appetite and weight are stable.  No abdominal pain.  Does have history of hemorrhoids in the past but no recent active bleeding.  Takes multivitamin once daily.  No family history of colon cancer.  We would briefly indicated our general direction of GI referral for these count of labs with iron deficiency but given her age of 89 and multiple comorbidities thought it might be best to discuss with family first.  Her last colonoscopy we cannot identify but apparently was many years ago.  No recent melena.  Past Medical History:  Diagnosis Date   BACK PAIN, LUMBAR, CHRONIC 02/20/2010   Cardiomegaly 03/16/2010   COLONIC POLYPS 02/20/2010   DIVERTICULITIS, HX OF 02/20/2010   GERD 02/20/2010   GOITER 02/20/2010   HYPERLIPIDEMIA 02/20/2010   OSTEOARTHRITIS, MULTIPLE JOINTS 02/20/2010   URINARY INCONTINENCE 02/20/2010    Past Surgical History:  Procedure Laterality Date   ABDOMINAL HYSTERECTOMY     NOSE SURGERY  1975   head surgery to repair cracked bone over nose    SPINE SURGERY     4 back surgeries   THYROIDECTOMY, PARTIAL     ? goiter, no CA   TONSILLECTOMY  1936   TOTAL SHOULDER ARTHROPLASTY   07/30/2011   Procedure: TOTAL SHOULDER ARTHROPLASTY;  Surgeon: Augustin Schooling, MD;  Location: [facility];  Service: Orthopedics;  Laterality: Right;  Right Reversed Total Shoulder    Family History  Problem Relation Age of Onset   Cancer Mother        ovarian   Hyperlipidemia Mother    Diabetes Mother        type ll    Social History   Socioeconomic History   Marital status: Widowed    Spouse name: Not on file   Number of children: Not on file   Years of education: Not on file   Highest education level: Not on file  Occupational History   Not on file  Tobacco Use   Smoking status: Never   Smokeless tobacco: Never  Vaping Use   Vaping Use: Never used  Substance and Sexual Activity   Alcohol use: No   Drug use: No   Sexual activity: Not on file  Other Topics Concern   Not on file  Social History Narrative   Lives alone   In home assistance throughout day    Social Determinants of Health   Financial Resource Strain: Low Risk    Difficulty of Paying Living Expenses: Not hard at all  Food Insecurity: No Food Insecurity   Worried About [place] in the Last Year: Never true   Ran Out of  Food in the Last Year: Never true  Transportation Needs: No Transportation Needs   Lack of Transportation (Medical): No   Lack of Transportation (Non-Medical): No  Physical Activity: Inactive   Days of Exercise per Week: 0 days   Minutes of Exercise per Session: 0 min  Stress: No Stress Concern Present   Feeling of Stress : Not at all  Social Connections: Moderately Isolated   Frequency of Communication with Friends and Family: More than three times a week   Frequency of Social Gatherings with Friends and Family: Three times a week   Attends Religious Services: More than 4 times per year   Active Member of Clubs or Organizations: No   Attends Archivist Meetings: Never   Marital Status: Widowed  Human resources officer Violence: Not At Risk   Fear of Current or  Ex-Partner: No   Emotionally Abused: No   Physically Abused: No   Sexually Abused: No    Outpatient Medications Prior to Visit  Medication Sig Dispense Refill   baclofen (LIORESAL) 10 MG tablet TAKE 1 TABLET TWICE A DAY AS NEEDED FOR MUSCLE SPASMS 60 tablet 11   diphenhydramine-acetaminophen (TYLENOL PM) 25-500 MG TABS tablet Take 1 tablet by mouth at bedtime as needed. Takes 3 tablets at night.     Docusate Calcium (STOOL SOFTENER PO) Take by mouth.     ELIQUIS 5 MG TABS tablet AT THE START OF THERAPY , TAKE 2 TABLETS TWICE A DAY FOR 7 DAYS AND THEN 1 TABLET TWICE A DAY THEREAFTER 180 tablet 3   hydrochlorothiazide (MICROZIDE) 12.5 MG capsule TAKE 1 CAPSULE DAILY 90 capsule 3   MELATONIN PO Take 5 mg by mouth at bedtime.     Multiple Vitamin (MULITIVITAMIN WITH MINERALS) TABS Take 1 tablet by mouth daily.     pramipexole (MIRAPEX) 0.125 MG tablet TAKE 1 TO 2 TABLETS BY MOUTH AT NIGHT AS NEEDED FOR RESTLESS LEGS OR SYMPTOMS 60 tablet 3   traMADol (ULTRAM) 50 MG tablet TAKE 1 TABLET EVERY 12 HOURS AS NEEDED 90 tablet 0   No facility-administered medications prior to visit.    No Known Allergies  ROS Review of Systems  Constitutional:  Negative for appetite change and unexpected weight change.  Respiratory:  Negative for shortness of breath.   Cardiovascular:  Negative for chest pain.  Gastrointestinal:  Negative for abdominal pain and blood in stool.  Neurological:  Negative for dizziness.     Objective:    Physical Exam Vitals reviewed.  Constitutional:      Appearance: Normal appearance.  Cardiovascular:     Rate and Rhythm: Normal rate and regular rhythm.  Pulmonary:     Comments: She does have some rales in both bases.  No wheezing. Abdominal:     Palpations: Abdomen is soft.     Tenderness: There is no abdominal tenderness.  Neurological:     Mental Status: She is alert.    BP 124/62 (BP Location: Left Arm, Patient Position: Sitting, Cuff Size: Normal)   Pulse 80    Temp 97.6 F (36.4 C) (Oral)   SpO2 97%  Wt Readings from Last 3 Encounters:  04/24/20 157 lb 3 oz (71.3 kg)  08/21/18 159 lb 6.4 oz (72.3 kg)  04/20/17 151 lb (68.5 kg)     Health Maintenance Due  Topic Date Due   TETANUS/TDAP  Never done   Zoster Vaccines- Shingrix (1 of 2) Never done   COVID-19 Vaccine (3 - Pfizer risk series) 04/20/2019  There are no preventive care reminders to display for this patient.  Lab Results  Component Value Date   TSH 1.29 01/11/2011   Lab Results  Component Value Date   WBC 5.1 08/04/2020   HGB 10.2 (L) 08/04/2020   HCT 31.6 (L) 08/04/2020   MCV 72.6 (L) 08/04/2020   PLT 399.0 08/04/2020   Lab Results  Component Value Date   NA 136 08/04/2020   K 3.7 08/04/2020   CO2 26 08/04/2020   GLUCOSE 94 08/04/2020   BUN 14 08/04/2020   CREATININE 0.65 08/04/2020   BILITOT 0.5 11/14/2018   ALKPHOS 59 11/14/2018   AST 21 11/14/2018   ALT 17 11/14/2018   PROT 7.0 11/14/2018   ALBUMIN 4.2 11/14/2018   CALCIUM 9.0 08/04/2020   GFR 77.31 08/04/2020   Lab Results  Component Value Date   CHOL 236 (H) 10/29/2013   Lab Results  Component Value Date   HDL 38.40 (L) 10/29/2013   Lab Results  Component Value Date   LDLCALC 159 (H) 10/29/2013   Lab Results  Component Value Date   TRIG 192.0 (H) 10/29/2013   Lab Results  Component Value Date   CHOLHDL 6 10/29/2013   No results found for: HGBA1C    Assessment & Plan:   #1 iron deficiency anemia without recent labs.  Thankfully she does not have any red flags such as appetite change or weight loss.  We discussed that iron deficiency anemia can stem from many causes but predominantly from GI losses.  We explained this can be both from benign sources such as AVMs but also potentially life-threatening things such as malignancy.  We had a long discussion today with patient and daughter regarding pros and cons of GI work-up.  At this point, because of her age they are reluctant to consider  EGD and colonoscopy.  We explained not working up this further there is certainly a chance this could be malignancy that we would be delaying treatment for.  They have a good comprehension of this. Their preference is to start iron replacement and reassess in a month or so.  We will also send Hemoccult cards. -Order for future labs in about 6 weeks with CBC, repeat iron studies  #2 chronic anticoagulation with Eliquis secondary to recurrent PE.  At this point would not stop Eliquis unless her hemoglobin continues to drop or less there is any evidence for acute GI bleeding.   No orders of the defined types were placed in this encounter.   Follow-up: No follow-ups on file.    Carolann Littler, MD

## 2020-08-29 NOTE — Patient Instructions (Signed)
Consider over the counter iron sulfate 325 mg one daily  Complete the hemoccult cards.

## 2020-09-16 DIAGNOSIS — C44712 Basal cell carcinoma of skin of right lower limb, including hip: Secondary | ICD-10-CM | POA: Diagnosis not present

## 2020-09-30 DIAGNOSIS — S81801A Unspecified open wound, right lower leg, initial encounter: Secondary | ICD-10-CM | POA: Diagnosis not present

## 2020-10-02 ENCOUNTER — Other Ambulatory Visit: Payer: Self-pay | Admitting: Family Medicine

## 2020-10-03 NOTE — Telephone Encounter (Signed)
Last filled 07/11/2020 Last OV 08/29/2020  Ok to fill?

## 2020-10-14 ENCOUNTER — Other Ambulatory Visit (INDEPENDENT_AMBULATORY_CARE_PROVIDER_SITE_OTHER): Payer: Medicare Other

## 2020-10-14 ENCOUNTER — Other Ambulatory Visit: Payer: Medicare Other

## 2020-10-14 ENCOUNTER — Other Ambulatory Visit: Payer: Self-pay

## 2020-10-14 DIAGNOSIS — C44712 Basal cell carcinoma of skin of right lower limb, including hip: Secondary | ICD-10-CM | POA: Diagnosis not present

## 2020-10-14 DIAGNOSIS — D509 Iron deficiency anemia, unspecified: Secondary | ICD-10-CM

## 2020-10-14 DIAGNOSIS — Z85828 Personal history of other malignant neoplasm of skin: Secondary | ICD-10-CM | POA: Diagnosis not present

## 2020-10-14 DIAGNOSIS — S81801D Unspecified open wound, right lower leg, subsequent encounter: Secondary | ICD-10-CM | POA: Diagnosis not present

## 2020-10-14 LAB — IBC PANEL
Iron: 114 ug/dL (ref 42–145)
Saturation Ratios: 39 % (ref 20.0–50.0)
TIBC: 292.6 ug/dL (ref 250.0–450.0)
Transferrin: 209 mg/dL — ABNORMAL LOW (ref 212.0–360.0)

## 2020-10-14 LAB — CBC WITH DIFFERENTIAL/PLATELET
Basophils Absolute: 0 10*3/uL (ref 0.0–0.1)
Basophils Relative: 0.6 % (ref 0.0–3.0)
Eosinophils Absolute: 0.2 10*3/uL (ref 0.0–0.7)
Eosinophils Relative: 2.9 % (ref 0.0–5.0)
HCT: 39.7 % (ref 36.0–46.0)
Hemoglobin: 12.5 g/dL (ref 12.0–15.0)
Lymphocytes Relative: 32.5 % (ref 12.0–46.0)
Lymphs Abs: 1.8 10*3/uL (ref 0.7–4.0)
MCHC: 31.6 g/dL (ref 30.0–36.0)
MCV: 78.7 fl (ref 78.0–100.0)
Monocytes Absolute: 0.6 10*3/uL (ref 0.1–1.0)
Monocytes Relative: 11.3 % (ref 3.0–12.0)
Neutro Abs: 2.9 10*3/uL (ref 1.4–7.7)
Neutrophils Relative %: 52.7 % (ref 43.0–77.0)
Platelets: 413 10*3/uL — ABNORMAL HIGH (ref 150.0–400.0)
RBC: 5.04 Mil/uL (ref 3.87–5.11)
RDW: 25.1 % — ABNORMAL HIGH (ref 11.5–15.5)
WBC: 5.6 10*3/uL (ref 4.0–10.5)

## 2020-10-14 LAB — FERRITIN: Ferritin: 21.3 ng/mL (ref 10.0–291.0)

## 2020-10-21 DIAGNOSIS — Z48817 Encounter for surgical aftercare following surgery on the skin and subcutaneous tissue: Secondary | ICD-10-CM | POA: Diagnosis not present

## 2020-10-28 DIAGNOSIS — Z48817 Encounter for surgical aftercare following surgery on the skin and subcutaneous tissue: Secondary | ICD-10-CM | POA: Diagnosis not present

## 2020-12-02 DIAGNOSIS — L905 Scar conditions and fibrosis of skin: Secondary | ICD-10-CM | POA: Diagnosis not present

## 2020-12-04 ENCOUNTER — Other Ambulatory Visit: Payer: Self-pay | Admitting: Family Medicine

## 2020-12-08 ENCOUNTER — Other Ambulatory Visit: Payer: Self-pay | Admitting: Family Medicine

## 2021-01-02 DIAGNOSIS — Z23 Encounter for immunization: Secondary | ICD-10-CM | POA: Diagnosis not present

## 2021-01-22 ENCOUNTER — Other Ambulatory Visit: Payer: Self-pay

## 2021-01-22 ENCOUNTER — Telehealth: Payer: Self-pay

## 2021-01-22 MED ORDER — HYDROCHLOROTHIAZIDE 12.5 MG PO CAPS: 12.5000 mg | ORAL_CAPSULE | Freq: Every day | ORAL | 3 refills | Status: DC

## 2021-01-22 NOTE — Telephone Encounter (Signed)
Daughter of patient called requesting Rx refill  hydrochlorothiazide (MICROZIDE) 12.5 MG capsule Rx sent

## 2021-01-22 NOTE — Telephone Encounter (Signed)
Noted  

## 2021-03-26 ENCOUNTER — Other Ambulatory Visit: Payer: Self-pay | Admitting: Family Medicine

## 2021-03-26 NOTE — Telephone Encounter (Signed)
Last OV- 08/29/20 Last refill- 10/03/20---90 tabs, 1 refill  No future OV scheduled.   Can this patient receive a refill?

## 2021-04-30 ENCOUNTER — Ambulatory Visit (INDEPENDENT_AMBULATORY_CARE_PROVIDER_SITE_OTHER): Payer: Medicare Other

## 2021-04-30 VITALS — BP 113/67 | HR 76 | Ht 61.0 in | Wt 165.0 lb

## 2021-04-30 DIAGNOSIS — Z0000 Encounter for general adult medical examination without abnormal findings: Secondary | ICD-10-CM | POA: Diagnosis not present

## 2021-04-30 NOTE — Progress Notes (Signed)
? ?Subjective:  ? [name] is a 88 y.o. female who presents for Medicare Annual (Subsequent) preventive examination. ? ?Review of Systems    ?Virtual Visit via Telephone Note ? ?I connected with  [name] on 04/30/21 at 10:30 AM EDT by telephone and verified that I am speaking with the correct person using two identifiers. ? ?Location: ?Patient: Home ?Provider: Office ?Persons participating in the virtual visit: patient/Nurse Health Advisor ?  ?I discussed the limitations, risks, security and privacy concerns of performing an evaluation and management service by telephone and the availability of in person appointments. The patient expressed understanding and agreed to proceed. ? ?Interactive audio and video telecommunications were attempted between this nurse and patient, however failed, due to patient having technical difficulties OR patient did not have access to video capability.  We continued and completed visit with audio only. ? ?Some vital signs may be absent or patient reported.  ? ?Criselda Peaches, LPN  ?Cardiac Risk Factors include: advanced age (>74mn, >>55women);hypertension ? ?   ?Objective:  ?  ?Today's Vitals  ? 04/30/21 1031  ?BP: 113/67  ?Pulse: 76  ?SpO2: 93%  ?Weight: 165 lb (74.8 kg)  ?Height: '5\' 1"'$  (1.549 m)  ? ?Body mass index is 31.18 kg/m?. ? ?Advanced Directives 04/30/2021 04/24/2020 04/09/2019 01/13/2016 07/22/2011  ?Does Patient Have a Medical Advance Directive? Yes Yes Yes Yes Patient has advance directive, copy not in chart  ?Type of AParamedicof [place]isc[place] will [place]Living will Living will;Healthcare Power of ADoloresLiving will  ?Does patient want to make changes to medical advance directive? No - Patient declined No - Patient declined No - Patient declined (No Data) -  ?Copy of [place] Chart? No - copy requested No - copy requested No - copy requested - -   ? ? ?Current Medications (verified) ?Outpatient Encounter Medications as of 04/30/2021  ?Medication Sig  ? baclofen (LIORESAL) 10 MG tablet TAKE 1 TABLET TWICE A DAY AS NEEDED FOR MUSCLE SPASMS  ? diphenhydramine-acetaminophen (TYLENOL PM) 25-500 MG TABS tablet Take 1 tablet by mouth at bedtime as needed. Takes 3 tablets at night.  ? Docusate Calcium (STOOL SOFTENER PO) Take by mouth.  ? ELIQUIS 5 MG TABS tablet AT THE START OF THERAPY , TAKE 2 TABLETS TWICE A DAY FOR 7 DAYS AND THEN 1 TABLET TWICE A DAY THEREAFTER  ? hydrochlorothiazide (MICROZIDE) 12.5 MG capsule Take 1 capsule (12.5 mg total) by mouth daily.  ? MELATONIN PO Take 5 mg by mouth at bedtime.  ? Multiple Vitamin (MULITIVITAMIN WITH MINERALS) TABS Take 1 tablet by mouth daily.  ? pramipexole (MIRAPEX) 0.125 MG tablet TAKE 1 TO 2 TABLETS BY MOUTH AT NIGHT AS NEEDED FOR RESTLESS LEGS OR SYMPTOMS  ? traMADol (ULTRAM) 50 MG tablet TAKE 1 TABLET BY MOUTH EVERY 12 HOURS AS NEEDED  ? ?No facility-administered encounter medications on file as of 04/30/2021.  ? ? ?Allergies (verified) ?Patient has no known allergies.  ? ?History: ?Past Medical History:  ?Diagnosis Date  ? BACK PAIN, LUMBAR, CHRONIC 02/20/2010  ? Cardiomegaly 03/16/2010  ? COLONIC POLYPS 02/20/2010  ? DIVERTICULITIS, HX OF 02/20/2010  ? GERD 02/20/2010  ? GOITER 02/20/2010  ? HYPERLIPIDEMIA 02/20/2010  ? OSTEOARTHRITIS, MULTIPLE JOINTS 02/20/2010  ? URINARY INCONTINENCE 02/20/2010  ? ?Past Surgical History:  ?Procedure Laterality Date  ? ABDOMINAL HYSTERECTOMY    ? NOSE SURGERY  1975  ? head surgery  to repair cracked bone over nose   ? SPINE SURGERY    ? 4 back surgeries  ? THYROIDECTOMY, PARTIAL    ? ? goiter, no CA  ? TONSILLECTOMY  1936  ? TOTAL SHOULDER ARTHROPLASTY  07/30/2011  ? Procedure: TOTAL SHOULDER ARTHROPLASTY;  Surgeon: Augustin Schooling, MD;  Location: [facility];  Service: Orthopedics;  Laterality: Right;  Right Reversed Total Shoulder  ? ?Family History  ?Problem Relation Age of Onset  ? Cancer Mother    ?     ovarian  ? Hyperlipidemia Mother   ? Diabetes Mother   ?     type ll  ? ?Social History  ? ?Socioeconomic History  ? Marital status: Widowed  ?  Spouse name: Not on file  ? Number of children: Not on file  ? Years of education: Not on file  ? Highest education level: Not on file  ?Occupational History  ? Not on file  ?Tobacco Use  ? Smoking status: Never  ? Smokeless tobacco: Never  ?Vaping Use  ? Vaping Use: Never used  ?Substance and Sexual Activity  ? Alcohol use: No  ? Drug use: No  ? Sexual activity: Not on file  ?Other Topics Concern  ? Not on file  ?Social History Narrative  ? Lives alone  ? In home assistance throughout day   ? ?Social Determinants of Health  ? ?Financial Resource Strain: Low Risk   ? Difficulty of Paying Living Expenses: Not hard at all  ?Food Insecurity: No Food Insecurity  ? Worried About Charity fundraiser in the Last Year: Never true  ? Ran Out of Food in the Last Year: Never true  ?Transportation Needs: No Transportation Needs  ? Lack of Transportation (Medical): No  ? Lack of Transportation (Non-Medical): No  ?Physical Activity: Insufficiently Active  ? Days of Exercise per Week: 1 day  ? Minutes of Exercise per Session: 20 min  ?Stress: No Stress Concern Present  ? Feeling of Stress : Not at all  ?Social Connections: Moderately Integrated  ? Frequency of Communication with Friends and Family: More than three times a week  ? Frequency of Social Gatherings with Friends and Family: More than three times a week  ? Attends Religious Services: More than 4 times per year  ? Active Member of Clubs or Organizations: Yes  ? Attends Archivist Meetings: More than 4 times per year  ? Marital Status: Widowed  ? ? ? ?Clinical Intake: ?How often do you need to have someone help you when you read instructions, pamphlets, or other written materials from your doctor or pharmacy?: 1 - Never ? ?Diabetic?  No ? ?Activities of Daily Living ?In your present state of health, do you have  any difficulty performing the following activities: 04/30/2021  ?Hearing? N  ?Vision? N  ?Difficulty concentrating or making decisions? N  ?Walking or climbing stairs? N  ?Dressing or bathing? Y  ?Comment Aide assist  ?Doing errands, shopping? N  ?Preparing Food and eating ? Y  ?Comment Aide assist  ?Using the Toilet? Y  ?Comment Aide assist  ?In the past six months, have you accidently leaked urine? N  ?Do you have problems with loss of bowel control? N  ?Managing your Medications? Y  ?Comment Aide assist  ?Housekeeping or managing your Housekeeping? Y  ?Comment Aide assist  ?Some recent data might be hidden  ? ? ?Patient Care Team: ?Eulas Post, MD as PCP - General ? ?Indicate any  recent Medical Services you may have received from other than [facility] providers in the past year (date may be approximate). ? ?   ?Assessment:  ? This is a routine wellness examination for [name]. ? ?Hearing/Vision screen ?Hearing Screening - Comments:: Wears hearing aids ?Vision Screening - Comments:: Wears reading glasses.  ? ?Dietary issues and exercise activities discussed: ?Exercise limited by: None identified ? ? Goals Addressed   ? ?  ?  ?  ?  ?  ? This Visit's Progress  ?   patient (pt-stated)     ?   Continue to move legs up and down. ?  ? ?  ? ?Depression Screen ?PHQ 2/9 Scores 04/30/2021 04/24/2020 04/09/2019 08/21/2018 05/07/2016 08/23/2014  ?PHQ - 2 Score 0 0 0 0 0 0  ?  ?Fall Risk ?Fall Risk  04/30/2021 04/24/2020 04/09/2019 01/08/2019 12/30/2017  ?Falls in the past year? 0 0 0 0 0  ?Comment - - - Emmi Telephone Survey: data to providers prior to load Emmi Telephone Survey: data to providers prior to load  ?Number falls in past yr: 0 0 0 - -  ?Injury with Fall? 0 0 0 - -  ?Risk for fall due to : No Fall Risks Impaired balance/gait;Impaired mobility History of fall(s);Impaired balance/gait;Impaired mobility - -  ?Follow up - Falls prevention discussed;Falls evaluation completed Education provided;Falls evaluation completed;Falls  prevention discussed - -  ? ? ?FALL RISK PREVENTION PERTAINING TO THE HOME: ? ?Any stairs in or around the home? Yes  ?If so, are there any without handrails? No  ?Home free of loose throw rugs in walkway

## 2021-04-30 NOTE — Patient Instructions (Addendum)
?Ms. Willette , ?Thank you for taking time to come for your Medicare Wellness Visit. I appreciate your ongoing commitment to your health goals. Please review the following plan we discussed and let me know if I can assist you in the future.  ? ?These are the goals we discussed: ? Goals   ? ?   patient (pt-stated)   ?   Continue to move legs up and down. ?  ?   Patient Stated   ?   I just want to maintain current health ?  ? ?  ?  ?This is a list of the screening recommended for you and due dates:  ?Health Maintenance  ?Topic Date Due  ? COVID-19 Vaccine (4 - Booster for Pfizer series) 02/27/2021  ? Zoster (Shingles) Vaccine (1 of 2) 07/31/2021*  ? Tetanus Vaccine  05/01/2022*  ? Pneumonia Vaccine  Completed  ? Flu Shot  Completed  ? HPV Vaccine  Aged Out  ? DEXA scan (bone density measurement)  Discontinued  ?*Topic was postponed. The date shown is not the original due date.  ?  ?Opioid Pain Medicine Management ?Opioids are powerful medicines that are used to treat moderate to severe pain. When used for short periods of time, they can help you to: ?Sleep better. ?Do better in physical or occupational therapy. ?Feel better in the first few days after an injury. ?Recover from surgery. ?Opioids should be taken with the supervision of a trained health care provider. They should be taken for the shortest period of time possible. This is because opioids can be addictive, and the longer you take opioids, the greater your risk of addiction. This addiction can also be called opioid use disorder. ?What are the risks? ?Using opioid pain medicines for longer than 3 days increases your risk of side effects. Side effects include: ?Constipation. ?Nausea and vomiting. ?Breathing difficulties (respiratory depression). ?Drowsiness. ?Confusion. ?Opioid use disorder. ?Itching. ?Taking opioid pain medicine for a long period of time can affect your ability to do daily tasks. It also puts you at risk for: ?Motor vehicle  crashes. ?Depression. ?Suicide. ?Heart attack. ?Overdose, which can be life-threatening. ?What is a pain treatment plan? ?A pain treatment plan is an agreement between you and your health care provider. Pain is unique to each person, and treatments vary depending on your condition. To manage your pain, you and your health care provider need to work together. To help you do this: ?Discuss the goals of your treatment, including how much pain you might expect to have and how you will manage the pain. ?Review the risks and benefits of taking opioid medicines. ?Remember that a good treatment plan uses more than one approach and minimizes the chance of side effects. ?Be honest about the amount of medicines you take and about any drug or alcohol use. ?Get pain medicine prescriptions from only one health care provider. ?Pain can be managed with many types of alternative treatments. Ask your health care provider to refer you to one or more specialists who can help you manage pain through: ?Physical or occupational therapy. ?Counseling (cognitive behavioral therapy). ?Good nutrition. ?Biofeedback. ?Massage. ?Meditation. ?Non-opioid medicine. ?Following a gentle exercise program. ?How to use opioid pain medicine ?Taking medicine ?Take your pain medicine exactly as told by your health care provider. Take it only when you need it. ?If your pain gets less severe, you may take less than your prescribed dose if your health care provider approves. ?If you are not having pain, do nottake pain medicine  unless your health care provider tells you to take it. ?If your pain is severe, do nottry to treat it yourself by taking more pills than instructed on your prescription. Contact your health care provider for help. ?Write down the times when you take your pain medicine. It is easy to become confused while on pain medicine. Writing the time can help you avoid overdose. ?Take other over-the-counter or prescription medicines only as told by  your health care provider. ?Keeping yourself and others safe ? ?While you are taking opioid pain medicine: ?Do not drive, use machinery, or power tools. ?Do not sign legal documents. ?Do not drink alcohol. ?Do not take sleeping pills. ?Do not supervise children by yourself. ?Do not do activities that require climbing or being in high places. ?Do not go to a lake, river, ocean, spa, or swimming pool. ?Do not share your pain medicine with anyone. ?Keep pain medicine in a locked cabinet or in a secure area where pets and children cannot reach it. ?Stopping your use of opioids ?If you have been taking opioid medicine for more than a few weeks, you may need to slowly decrease (taper) how much you take until you stop completely. Tapering your use of opioids can decrease your risk of symptoms of withdrawal, such as: ?Pain and cramping in the abdomen. ?Nausea. ?Sweating. ?Sleepiness. ?Restlessness. ?Uncontrollable shaking (tremors). ?Cravings for the medicine. ?Do not attempt to taper your use of opioids on your own. Talk with your health care provider about how to do this. Your health care provider may prescribe a step-down schedule based on how much medicine you are taking and how long you have been taking it. ?Getting rid of leftover pills ?Do not save any leftover pills. Get rid of leftover pills safely by: ?Taking the medicine to a prescription take-back program. This is usually offered by the county or law enforcement. ?Bringing them to a pharmacy that has a drug disposal container. ?Flushing them down the toilet. Check the label or package insert of your medicine to see whether this is safe to do. ?Throwing them out in the trash. Check the label or package insert of your medicine to see whether this is safe to do. ?If it is safe to throw it out, remove the medicine from the original container, put it into a sealable bag or container, and mix it with used coffee grounds, food scraps, dirt, or cat litter before putting  it in the trash. ?Follow these instructions at home: ?Activity ?Do exercises as told by your health care provider. ?Avoid activities that make your pain worse. ?Return to your normal activities as told by your health care provider. Ask your health care provider what activities are safe for you. ?General instructions ?You may need to take these actions to prevent or treat constipation: ?Drink enough fluid to keep your urine pale yellow. ?Take over-the-counter or prescription medicines. ?Eat foods that are high in fiber, such as beans, whole grains, and fresh fruits and vegetables. ?Limit foods that are high in fat and processed sugars, such as fried or sweet foods. ?Keep all follow-up visits. This is important. ?Where to find support ?If you have been taking opioids for a long time, you may benefit from receiving support for quitting from a local support group or counselor. Ask your health care provider for a referral to these resources in your area. ?Where to find more information ?Centers for Disease Control and Prevention (CDC): http://www.wolf.info/ ?[place] Food and Drug Administration (FDA): [place] ?Get help right away  if: ?You may have taken too much of an opioid (overdosed). Common symptoms of an overdose: ?Your breathing is slower or more shallow than normal. ?You have a very slow heartbeat (pulse). ?You have slurred speech. ?You have nausea and vomiting. ?Your pupils become very small. ?You have other potential symptoms: ?You are very confused. ?You faint or feel like you will faint. ?You have cold, clammy skin. ?You have blue lips or fingernails. ?You have thoughts of harming yourself or harming others. ?These symptoms may represent a serious problem that is an emergency. Do not wait to see if the symptoms will go away. Get medical help right away. Call your local emergency services (911 in the [place] Do not drive yourself to the hospital.  ?If you ever feel like you may hurt yourself or others, or have thoughts  about taking your own life, get help right away. Go to your nearest emergency department or: ?Call your local emergency services (911 in the [place] ?Call the [facility] [phone] in the [place]

## 2021-09-24 ENCOUNTER — Other Ambulatory Visit: Payer: Self-pay | Admitting: Family Medicine

## 2021-09-24 NOTE — Telephone Encounter (Signed)
Last refill-03/27/21--90 tabs, 1 refill Last OV-08/29/20  No future OV scheduled.   Can patient receive a refill first, then I'll reach out to patient to schedule appointment?

## 2021-10-12 ENCOUNTER — Telehealth: Payer: Self-pay | Admitting: Family Medicine

## 2021-10-12 NOTE — Telephone Encounter (Signed)
Patient calling in with respiratory symptoms: Shortness of breath, chest pain, palpitations or other red words send to Triage  Does the patient have a fever over 100, cough, congestion, sore throat, runny nose, lost of taste/smell (please list symptoms that patient has)?cough,congestion  What date did symptoms start?10-10-2021 (If over 5 days ago, pt may be scheduled for in person visit)  Have you tested for Covid in the last 5 days? No   If yes, was it positive '[]'$  OR negative '[]'$ ? If positive in the last 5 days, please schedule virtual visit now. If negative, schedule for an in person OV with the next available provider if PCP has no openings. Please also let patient know they will be tested again (follow the script below)  "you will have to arrive 59mns prior to your appt time to be Covid tested. Please [facility] in back [facility] & call [phone] let the staff know you have arrived. A staff member will meet you at your car to do a rapid covid test. Once the test has resulted you will be notified by phone of your results to determine if appt will remain an in person visit or be converted to a virtual/phone visit. If you arrive less than 358ms before your appt time, your visit will be automatically converted to virtual & any recommended testing will happen AFTER the visit." Pt has appt 10-13-2021  THINGS TO REMEMBER  If no availability for virtual visit in office,  please schedule another [facility]  If no availability at another [facility], please instruct patient that they can schedule an evisit or virtual visit through their mychart account. Visits up to 8pm  patients can be seen in office 5 days after positive COVID test

## 2021-10-13 ENCOUNTER — Encounter: Payer: Self-pay | Admitting: Family Medicine

## 2021-10-13 ENCOUNTER — Ambulatory Visit (INDEPENDENT_AMBULATORY_CARE_PROVIDER_SITE_OTHER): Payer: Medicare Other | Admitting: Family Medicine

## 2021-10-13 ENCOUNTER — Ambulatory Visit (INDEPENDENT_AMBULATORY_CARE_PROVIDER_SITE_OTHER): Payer: Medicare Other

## 2021-10-13 VITALS — BP 138/80 | HR 95 | Temp 98.1°F | Ht 61.0 in

## 2021-10-13 DIAGNOSIS — R059 Cough, unspecified: Secondary | ICD-10-CM

## 2021-10-13 DIAGNOSIS — R918 Other nonspecific abnormal finding of lung field: Secondary | ICD-10-CM | POA: Diagnosis not present

## 2021-10-13 DIAGNOSIS — R062 Wheezing: Secondary | ICD-10-CM

## 2021-10-13 DIAGNOSIS — R0600 Dyspnea, unspecified: Secondary | ICD-10-CM | POA: Diagnosis not present

## 2021-10-13 DIAGNOSIS — R0989 Other specified symptoms and signs involving the circulatory and respiratory systems: Secondary | ICD-10-CM

## 2021-10-13 LAB — POCT INFLUENZA A/B
Influenza A, POC: NEGATIVE
Influenza B, POC: NEGATIVE

## 2021-10-13 LAB — POC COVID19 BINAXNOW: SARS Coronavirus 2 Ag: NEGATIVE

## 2021-10-13 MED ORDER — LEVOFLOXACIN 500 MG PO TABS: 500.0000 mg | ORAL_TABLET | Freq: Every day | ORAL | 0 refills | Status: AC

## 2021-10-13 MED ORDER — ALBUTEROL SULFATE HFA 108 (90 BASE) MCG/ACT IN AERS: 2.0000 | INHALATION_SPRAY | RESPIRATORY_TRACT | 0 refills | Status: DC | PRN

## 2021-10-13 MED ORDER — METHYLPREDNISOLONE ACETATE 80 MG/ML IJ SUSP
80.0000 mg | Freq: Once | INTRAMUSCULAR | Status: AC
  Administered 2021-10-13: 80 mg via INTRAMUSCULAR

## 2021-10-13 MED ORDER — AEROCHAMBER MV MISC
2 refills | Status: DC
Start: 2021-10-13 — End: 2023-03-10

## 2021-10-13 MED ORDER — ALBUTEROL SULFATE (2.5 MG/3ML) 0.083% IN NEBU
2.5000 mg | INHALATION_SOLUTION | Freq: Once | RESPIRATORY_TRACT | Status: AC
  Administered 2021-10-13: 2.5 mg via RESPIRATORY_TRACT

## 2021-10-13 MED ORDER — BACLOFEN 10 MG PO TABS
ORAL_TABLET | ORAL | 3 refills | Status: DC
Start: 2021-10-13 — End: 2022-01-25

## 2021-10-13 MED ORDER — HYDROCODONE BIT-HOMATROP MBR 5-1.5 MG/5ML PO SOLN: 5.0000 mL | Freq: Three times a day (TID) | ORAL | 0 refills | Status: DC | PRN

## 2021-10-13 NOTE — Progress Notes (Signed)
Established Patient Office Visit  Subjective   Patient ID: [name], female    DOB: 07/20/1927  Age: 88 y.o. MRN: [id number]  Chief Complaint  Patient presents with   Cough    Patient complains of cough, x4 days, Non productive cough   Nasal Congestion    Patient complains of nasal congestion, x4 days, Patient tried Mucinex, Nyquil, and Delsum with little relief   Diarrhea    Patient complains of diarrhea, x1 day     HPI   [name] is seen today with 4-day history of mostly nonproductive cough, nasal congestion, and 1 day history of diarrhea.  She is just couple days shy of turning 89.  No nausea or vomiting.  She is here today with her daughter and one of her primary caregivers.  They have been monitoring O2 sats and these have been mostly around 95%.  They have tried several over-the-counter medications including Mucinex, NyQuil, and Delsym with little relief of her cough.  She has no history of smoking.  Family had performed home COVID test couple days ago which came back negative.  COVID testing here today and influenza testing negative as well.  Past Medical History:  Diagnosis Date   BACK PAIN, LUMBAR, CHRONIC 02/20/2010   Cardiomegaly 03/16/2010   COLONIC POLYPS 02/20/2010   DIVERTICULITIS, HX OF 02/20/2010   GERD 02/20/2010   GOITER 02/20/2010   HYPERLIPIDEMIA 02/20/2010   OSTEOARTHRITIS, MULTIPLE JOINTS 02/20/2010   URINARY INCONTINENCE 02/20/2010   Past Surgical History:  Procedure Laterality Date   ABDOMINAL HYSTERECTOMY     NOSE SURGERY  1975   head surgery to repair cracked bone over nose    SPINE SURGERY     4 back surgeries   THYROIDECTOMY, PARTIAL     ? goiter, no CA   TONSILLECTOMY  1936   TOTAL SHOULDER ARTHROPLASTY  07/30/2011   Procedure: TOTAL SHOULDER ARTHROPLASTY;  Surgeon: Augustin Schooling, MD;  Location: [facility];  Service: Orthopedics;  Laterality: Right;  Right Reversed Total Shoulder    reports that she has never smoked. She has never used  smokeless tobacco. She reports that she does not drink alcohol and does not use drugs. family history includes Cancer in her mother; Diabetes in her mother; Hyperlipidemia in her mother. No Known Allergies  Review of Systems  Constitutional:  Negative for chills and fever.  HENT:  Positive for congestion.   Respiratory:  Positive for cough and wheezing.   Gastrointestinal:  Positive for diarrhea. Negative for abdominal pain, blood in stool, nausea and vomiting.  Genitourinary:  Negative for dysuria.      Objective:     BP 138/80 (BP Location: Left Arm, Patient Position: Sitting, Cuff Size: Normal)   Pulse 95   Temp 98.1 F (36.7 C) (Oral)   Ht '5\' 1"'$  (1.549 m)   SpO2 93%   BMI 31.18 kg/m    Physical Exam Vitals reviewed.  HENT:     Mouth/Throat:     Mouth: Mucous membranes are moist.     Pharynx: Oropharynx is clear.  Cardiovascular:     Rate and Rhythm: Normal rate and regular rhythm.  Pulmonary:     Comments: She has some faint diffuse wheezes.  No retractions.  Pulse oximetry 93% at room air Musculoskeletal:     Comments: Trace edema lower legs and ankles which daughter states is near her baseline  Neurological:     Mental Status: She is alert.      No  results found for any visits on 10/13/21.    The ASCVD Risk score (Arnett DK, et al., 2019) failed to calculate for the following reasons:   The 2019 ASCVD risk score is only valid for ages 44 to 71    Assessment & Plan:   Problem List Items Addressed This Visit   None Visit Diagnoses     Cough, unspecified type    -  Primary   Relevant Orders   POC Influenza A/B   POC COVID-19   DG Chest 2 View   Chest congestion       Relevant Orders   POC Influenza A/B   POC COVID-19   DG Chest 2 View     88 year old female with 4-day history of cough and malaise.  X-ray somewhat difficult as patient unable to stand.  This will be over read.  She does have some mild reactive airway issues on exam.  No  respiratory distress.  Pulse oximetry 93 to 94% room air.  She was given treatment with albuterol nebulizer and noticed some decreased coughing afterwards.  -Depo-Medrol 80 mg IM given -Albuterol MDI with AeroChamber to use as needed -Levaquin 500 mg once daily for 7 days -Daughter requesting DNR.  We had a discussion with patient and she was very clear that she does not wish to be resuscitated if she should turn for the worse.  DNR forms completed.  Her daughter ([name])-who accompanied her today-is medical power of attorney -Recommend close home monitoring of pulse oximetry and be in touch if dropping below 90 at rest. -Patient does have sitters and very close supervision at home  No follow-ups on file.    Carolann Littler, MD

## 2021-10-15 ENCOUNTER — Other Ambulatory Visit: Payer: Self-pay | Admitting: Family Medicine

## 2021-10-21 ENCOUNTER — Ambulatory Visit (INDEPENDENT_AMBULATORY_CARE_PROVIDER_SITE_OTHER): Payer: Medicare Other

## 2021-10-21 ENCOUNTER — Ambulatory Visit (INDEPENDENT_AMBULATORY_CARE_PROVIDER_SITE_OTHER): Payer: Medicare Other | Admitting: Family Medicine

## 2021-10-21 ENCOUNTER — Other Ambulatory Visit: Payer: Self-pay | Admitting: Family Medicine

## 2021-10-21 ENCOUNTER — Encounter: Payer: Self-pay | Admitting: Family Medicine

## 2021-10-21 ENCOUNTER — Telehealth: Payer: Self-pay | Admitting: Family Medicine

## 2021-10-21 VITALS — BP 126/62 | HR 77 | Temp 98.5°F | Ht 61.0 in

## 2021-10-21 DIAGNOSIS — R059 Cough, unspecified: Secondary | ICD-10-CM | POA: Diagnosis not present

## 2021-10-21 DIAGNOSIS — R531 Weakness: Secondary | ICD-10-CM | POA: Diagnosis not present

## 2021-10-21 DIAGNOSIS — R0600 Dyspnea, unspecified: Secondary | ICD-10-CM

## 2021-10-21 DIAGNOSIS — J90 Pleural effusion, not elsewhere classified: Secondary | ICD-10-CM | POA: Diagnosis not present

## 2021-10-21 DIAGNOSIS — R062 Wheezing: Secondary | ICD-10-CM

## 2021-10-21 DIAGNOSIS — E871 Hypo-osmolality and hyponatremia: Secondary | ICD-10-CM | POA: Diagnosis not present

## 2021-10-21 DIAGNOSIS — J189 Pneumonia, unspecified organism: Secondary | ICD-10-CM | POA: Diagnosis not present

## 2021-10-21 DIAGNOSIS — R918 Other nonspecific abnormal finding of lung field: Secondary | ICD-10-CM | POA: Diagnosis not present

## 2021-10-21 MED ORDER — ALBUTEROL SULFATE (2.5 MG/3ML) 0.083% IN NEBU
2.5000 mg | INHALATION_SOLUTION | Freq: Once | RESPIRATORY_TRACT | Status: AC
  Administered 2021-10-21: 2.5 mg via RESPIRATORY_TRACT

## 2021-10-21 MED ORDER — IPRATROPIUM-ALBUTEROL 0.5-2.5 (3) MG/3ML IN SOLN: 3.0000 mL | Freq: Four times a day (QID) | RESPIRATORY_TRACT | 0 refills | Status: DC | PRN

## 2021-10-21 NOTE — Telephone Encounter (Signed)
Pt's daughter Josephina Shih called to ask if it would be ok for her mom to use an inhaler in case the Rx given today runs out on them.   The reason is because the pharmacy can only give them half of the prescription today and the other half tomorrow.  Please advise.  Also, [name] would like a call back to go over the results of the chest xray.   Please call [name] at [phone]

## 2021-10-21 NOTE — Telephone Encounter (Signed)
Patient's daughter informed of the message and verbalized understanding  

## 2021-10-21 NOTE — Progress Notes (Signed)
Established Patient Office Visit  Subjective   Patient ID: [name], female    DOB: [birth year]  Age: 88 y.o. MRN: [id number]  Chief Complaint  Patient presents with   Cough    Patient complains of cough, Non productive     Nasal Congestion    Patient complains of nasal congestion,    HPI   [name] as recently seen with severe cough.  She had had 4-day history of nonproductive cough and nasal congestion and 1 day of diarrhea prior to last visit on the 29th.  Chest x-ray showed possible early pneumonia.  O2 sats 95%.  COVID testing negative.  Influenza testing negative.  We covered her with broad-spectrum antibiotic with Levaquin 500 mg once daily.  Also given Depo-Medrol 80 mg IM as she has some very mild wheezing on exam.  She has been using albuterol with mask every 6 hours as needed.  Even took some cough suppressant without much relief.  She has had ongoing malaise.  No respiratory distress at rest.  O2 sats have been dropping down as low as 90% at times.  95% currently.  Generalized weakness and difficulty coughing up anything.  Poor appetite and fluid intake.  No dysuria.    Past Medical History:  Diagnosis Date   BACK PAIN, LUMBAR, CHRONIC 02/20/2010   Cardiomegaly 03/16/2010   COLONIC POLYPS 02/20/2010   DIVERTICULITIS, HX OF 02/20/2010   GERD 02/20/2010   GOITER 02/20/2010   HYPERLIPIDEMIA 02/20/2010   OSTEOARTHRITIS, MULTIPLE JOINTS 02/20/2010   URINARY INCONTINENCE 02/20/2010   Past Surgical History:  Procedure Laterality Date   ABDOMINAL HYSTERECTOMY     NOSE SURGERY  1975   head surgery to repair cracked bone over nose    SPINE SURGERY     4 back surgeries   THYROIDECTOMY, PARTIAL     ? goiter, no CA   TONSILLECTOMY  1936   TOTAL SHOULDER ARTHROPLASTY  07/30/2011   Procedure: TOTAL SHOULDER ARTHROPLASTY;  Surgeon: Augustin Schooling, MD;  Location: [facility];  Service: Orthopedics;  Laterality: Right;  Right Reversed Total Shoulder    reports that she has never  smoked. She has never used smokeless tobacco. She reports that she does not drink alcohol and does not use drugs. family history includes Cancer in her mother; Diabetes in her mother; Hyperlipidemia in her mother. No Known Allergies' Review of Systems  Constitutional:  Positive for malaise/fatigue. Negative for chills and fever.  Respiratory:  Positive for cough and shortness of breath. Negative for hemoptysis.   Cardiovascular:  Negative for chest pain.      Objective:     BP 126/62 (BP Location: Left Arm, Patient Position: Sitting, Cuff Size: Normal)   Pulse 77   Temp 98.5 F (36.9 C) (Oral)   Ht '5\' 1"'$  (1.549 m)   SpO2 95%   BMI 31.18 kg/m  BP Readings from Last 3 Encounters:  10/21/21 126/62  10/13/21 138/80  04/30/21 113/67   Wt Readings from Last 3 Encounters:  04/30/21 165 lb (74.8 kg)  04/24/20 157 lb 3 oz (71.3 kg)  08/21/18 159 lb 6.4 oz (72.3 kg)      Physical Exam Vitals reviewed.  Constitutional:      General: She is not in acute distress. HENT:     Right Ear: Tympanic membrane normal.     Left Ear: Tympanic membrane normal.     Mouth/Throat:     Comments: Oropharynx is moist and clear Cardiovascular:  Rate and Rhythm: Normal rate.  Pulmonary:     Comments: Diminished breath sounds in both bases.  No active wheezing.  No retractions.  Pulse oximetry 95% room air Musculoskeletal:     Comments: No pitting edema legs.      No results found for any visits on 10/21/21.    The ASCVD Risk score (Arnett DK, et al., 2019) failed to calculate for the following reasons:   The 2019 ASCVD risk score is only valid for ages 28 to 3    Assessment & Plan:   Problem List Items Addressed This Visit   None Visit Diagnoses     Cough, unspecified type    -  Primary   Relevant Medications   albuterol (PROVENTIL) (2.5 MG/3ML) 0.083% nebulizer solution 2.5 mg (Completed)   Other Relevant Orders   DG Chest 2 View   CBC with Differential/Platelet    Weakness       Relevant Orders   CMP     Patient is seen with persistent cough.  Possible developing pneumonia by recent x-ray lateral right lung base.  O2 sats maintaining around 95% here but according to family she has had very persistent cough and difficulty sleeping secondary to that.  -We discussed possible indicators for admission.  She is in no respiratory distress currently -Check labs with CBC with differential and CMP -Repeat PA and lateral chest x-ray -Patient felt like she benefited much better from nebulizer last visit.  We did give her 1 treatment here in office with DuoNeb and will supply home nebulizer along with DuoNeb to use every 6 hours as needed for cough and wheeze. -Stressed importance of good hydration -Consider over-the-counter Mucinex 1200 g twice daily -Continue to monitor O2 sats and be in touch if consistently less than 90% or call 911 or go to ER. -Patient is DNR which they had requested last visit -over 40 minutes spent reassessing her symptoms, reviewing medications, discussing management options.   No follow-ups on file.    Carolann Littler, MD

## 2021-10-22 LAB — COMPREHENSIVE METABOLIC PANEL
ALT: 19 U/L (ref 0–35)
AST: 28 U/L (ref 0–37)
Albumin: 3.7 g/dL (ref 3.5–5.2)
Alkaline Phosphatase: 62 U/L (ref 39–117)
BUN: 8 mg/dL (ref 6–23)
CO2: 29 mEq/L (ref 19–32)
Calcium: 9.4 mg/dL (ref 8.4–10.5)
Chloride: 86 mEq/L — ABNORMAL LOW (ref 96–112)
Creatinine, Ser: 0.59 mg/dL (ref 0.40–1.20)
GFR: 78.46 mL/min (ref >60.00)
Glucose, Bld: 92 mg/dL (ref 70–99)
Potassium: 4 mEq/L (ref 3.5–5.1)
Sodium: 123 mEq/L — ABNORMAL LOW (ref 135–145)
Total Bilirubin: 0.5 mg/dL (ref 0.2–1.2)
Total Protein: 7 g/dL (ref 6.0–8.3)

## 2021-10-22 LAB — CBC WITH DIFFERENTIAL/PLATELET
Basophils Absolute: 0 10*3/uL (ref 0.0–0.1)
Basophils Relative: 0.5 % (ref 0.0–3.0)
Eosinophils Absolute: 0.1 10*3/uL (ref 0.0–0.7)
Eosinophils Relative: 1.8 % (ref 0.0–5.0)
HCT: 38.6 % (ref 36.0–46.0)
Hemoglobin: 13.1 g/dL (ref 12.0–15.0)
Lymphocytes Relative: 30.1 % (ref 12.0–46.0)
Lymphs Abs: 2.1 10*3/uL (ref 0.7–4.0)
MCHC: 33.8 g/dL (ref 30.0–36.0)
MCV: 84.8 fl (ref 78.0–100.0)
Monocytes Absolute: 0.7 10*3/uL (ref 0.1–1.0)
Monocytes Relative: 10.4 % (ref 3.0–12.0)
Neutro Abs: 3.9 10*3/uL (ref 1.4–7.7)
Neutrophils Relative %: 57.2 % (ref 43.0–77.0)
Platelets: 377 10*3/uL (ref 150.0–400.0)
RBC: 4.55 Mil/uL (ref 3.87–5.11)
RDW: 14.9 % (ref 11.5–15.5)
WBC: 6.8 10*3/uL (ref 4.0–10.5)

## 2021-10-22 NOTE — Addendum Note (Signed)
Addended by: Nilda Riggs on: 10/22/2021 12:08 PM   Modules accepted: Orders

## 2021-10-23 ENCOUNTER — Telehealth: Payer: Self-pay | Admitting: Family Medicine

## 2021-10-23 ENCOUNTER — Other Ambulatory Visit: Payer: Self-pay

## 2021-10-23 ENCOUNTER — Encounter: Payer: Self-pay | Admitting: Family Medicine

## 2021-10-23 MED ORDER — LOSARTAN POTASSIUM 50 MG PO TABS: 50.0000 mg | ORAL_TABLET | Freq: Every day | ORAL | 0 refills | Status: DC

## 2021-10-23 MED ORDER — AZITHROMYCIN 250 MG PO TABS: ORAL_TABLET | ORAL | 0 refills | Status: AC

## 2021-10-23 NOTE — Telephone Encounter (Signed)
Rx sent and patient's daughter aware

## 2021-10-23 NOTE — Addendum Note (Signed)
Addended by: Nilda Riggs on: 10/23/2021 08:59 AM   Modules accepted: Orders

## 2021-10-23 NOTE — Addendum Note (Signed)
Addended by: Nilda Riggs on: 10/23/2021 08:51 AM   Modules accepted: Orders

## 2021-10-23 NOTE — Telephone Encounter (Signed)
Since patient has stopped taking hypertension meds her readings 165/90 and 151/103. They are concerned. Please advise

## 2021-10-26 ENCOUNTER — Telehealth: Payer: Self-pay | Admitting: Family Medicine

## 2021-10-26 DIAGNOSIS — E890 Postprocedural hypothyroidism: Secondary | ICD-10-CM | POA: Diagnosis not present

## 2021-10-26 DIAGNOSIS — M545 Low back pain, unspecified: Secondary | ICD-10-CM | POA: Diagnosis not present

## 2021-10-26 DIAGNOSIS — J189 Pneumonia, unspecified organism: Secondary | ICD-10-CM | POA: Diagnosis not present

## 2021-10-26 DIAGNOSIS — M159 Polyosteoarthritis, unspecified: Secondary | ICD-10-CM | POA: Diagnosis not present

## 2021-10-26 DIAGNOSIS — E871 Hypo-osmolality and hyponatremia: Secondary | ICD-10-CM | POA: Diagnosis not present

## 2021-10-26 DIAGNOSIS — R32 Unspecified urinary incontinence: Secondary | ICD-10-CM | POA: Diagnosis not present

## 2021-10-26 DIAGNOSIS — Z79891 Long term (current) use of opiate analgesic: Secondary | ICD-10-CM | POA: Diagnosis not present

## 2021-10-26 DIAGNOSIS — Z7901 Long term (current) use of anticoagulants: Secondary | ICD-10-CM | POA: Diagnosis not present

## 2021-10-26 DIAGNOSIS — Z993 Dependence on wheelchair: Secondary | ICD-10-CM | POA: Diagnosis not present

## 2021-10-26 DIAGNOSIS — E785 Hyperlipidemia, unspecified: Secondary | ICD-10-CM | POA: Diagnosis not present

## 2021-10-26 DIAGNOSIS — K219 Gastro-esophageal reflux disease without esophagitis: Secondary | ICD-10-CM | POA: Diagnosis not present

## 2021-10-26 DIAGNOSIS — I119 Hypertensive heart disease without heart failure: Secondary | ICD-10-CM | POA: Diagnosis not present

## 2021-10-26 DIAGNOSIS — G8929 Other chronic pain: Secondary | ICD-10-CM | POA: Diagnosis not present

## 2021-10-26 NOTE — Telephone Encounter (Signed)
Wants to add on OT to patient, does not have frequency yet.

## 2021-10-27 NOTE — Telephone Encounter (Signed)
[name] informed of orders

## 2021-10-28 DIAGNOSIS — M159 Polyosteoarthritis, unspecified: Secondary | ICD-10-CM | POA: Diagnosis not present

## 2021-10-28 DIAGNOSIS — J189 Pneumonia, unspecified organism: Secondary | ICD-10-CM | POA: Diagnosis not present

## 2021-10-28 DIAGNOSIS — M545 Low back pain, unspecified: Secondary | ICD-10-CM | POA: Diagnosis not present

## 2021-10-28 DIAGNOSIS — E871 Hypo-osmolality and hyponatremia: Secondary | ICD-10-CM | POA: Diagnosis not present

## 2021-10-28 DIAGNOSIS — G8929 Other chronic pain: Secondary | ICD-10-CM | POA: Diagnosis not present

## 2021-10-28 DIAGNOSIS — I119 Hypertensive heart disease without heart failure: Secondary | ICD-10-CM | POA: Diagnosis not present

## 2021-10-29 ENCOUNTER — Other Ambulatory Visit (INDEPENDENT_AMBULATORY_CARE_PROVIDER_SITE_OTHER): Payer: Medicare Other

## 2021-10-29 DIAGNOSIS — E871 Hypo-osmolality and hyponatremia: Secondary | ICD-10-CM

## 2021-10-29 LAB — BASIC METABOLIC PANEL
BUN: 11 mg/dL (ref 6–23)
CO2: 27 mEq/L (ref 19–32)
Calcium: 8.9 mg/dL (ref 8.4–10.5)
Chloride: 93 mEq/L — ABNORMAL LOW (ref 96–112)
Creatinine, Ser: 0.6 mg/dL (ref 0.40–1.20)
GFR: 78.13 mL/min (ref >60.00)
Glucose, Bld: 72 mg/dL (ref 70–99)
Potassium: 4.3 mEq/L (ref 3.5–5.1)
Sodium: 128 mEq/L — ABNORMAL LOW (ref 135–145)

## 2021-10-30 ENCOUNTER — Telehealth: Payer: Self-pay | Admitting: Family Medicine

## 2021-10-30 DIAGNOSIS — G8929 Other chronic pain: Secondary | ICD-10-CM | POA: Diagnosis not present

## 2021-10-30 DIAGNOSIS — M545 Low back pain, unspecified: Secondary | ICD-10-CM | POA: Diagnosis not present

## 2021-10-30 DIAGNOSIS — J189 Pneumonia, unspecified organism: Secondary | ICD-10-CM | POA: Diagnosis not present

## 2021-10-30 DIAGNOSIS — I119 Hypertensive heart disease without heart failure: Secondary | ICD-10-CM | POA: Diagnosis not present

## 2021-10-30 DIAGNOSIS — E871 Hypo-osmolality and hyponatremia: Secondary | ICD-10-CM | POA: Diagnosis not present

## 2021-10-30 DIAGNOSIS — M159 Polyosteoarthritis, unspecified: Secondary | ICD-10-CM | POA: Diagnosis not present

## 2021-10-30 MED ORDER — FUROSEMIDE 20 MG PO TABS: ORAL_TABLET | ORAL | 3 refills | Status: DC

## 2021-10-30 MED ORDER — LOSARTAN POTASSIUM 100 MG PO TABS: 100.0000 mg | ORAL_TABLET | Freq: Every day | ORAL | 0 refills | Status: DC

## 2021-10-30 NOTE — Telephone Encounter (Signed)
I spoke with [name] and informed her of the message below. Rx sent and f/u scheduled

## 2021-10-30 NOTE — Telephone Encounter (Signed)
Patient was sent to triage for high BP, having increased weakness as well. Patient's medication has recently been switched. BP has been high over the last week, has been weak over last three days. Outcome was to go to ED, but caregiver does not have authorization to take patient to ED. Would like to speak to someone from office     Please call caregiver, Candis Schatz, [phone]   Please advise

## 2021-10-30 NOTE — Telephone Encounter (Signed)
I spoke JoBeth and she states the patient has been experiencing weakness today. JoBeth also reported elevated BP with readings of 192/93 and 166/82. Patient is also experiencing Bilateral edema. JoBeth reported that she is looking for advice from PCP on how to proceed. Please advise

## 2021-11-02 ENCOUNTER — Ambulatory Visit (INDEPENDENT_AMBULATORY_CARE_PROVIDER_SITE_OTHER): Payer: Medicare Other | Admitting: Family Medicine

## 2021-11-02 ENCOUNTER — Encounter: Payer: Self-pay | Admitting: Family Medicine

## 2021-11-02 VITALS — BP 158/80 | HR 79 | Temp 97.6°F

## 2021-11-02 DIAGNOSIS — I119 Hypertensive heart disease without heart failure: Secondary | ICD-10-CM | POA: Diagnosis not present

## 2021-11-02 DIAGNOSIS — M159 Polyosteoarthritis, unspecified: Secondary | ICD-10-CM | POA: Diagnosis not present

## 2021-11-02 DIAGNOSIS — I10 Essential (primary) hypertension: Secondary | ICD-10-CM | POA: Diagnosis not present

## 2021-11-02 DIAGNOSIS — R059 Cough, unspecified: Secondary | ICD-10-CM

## 2021-11-02 DIAGNOSIS — G8929 Other chronic pain: Secondary | ICD-10-CM | POA: Diagnosis not present

## 2021-11-02 DIAGNOSIS — E871 Hypo-osmolality and hyponatremia: Secondary | ICD-10-CM | POA: Diagnosis not present

## 2021-11-02 DIAGNOSIS — J189 Pneumonia, unspecified organism: Secondary | ICD-10-CM

## 2021-11-02 DIAGNOSIS — M545 Low back pain, unspecified: Secondary | ICD-10-CM | POA: Diagnosis not present

## 2021-11-02 MED ORDER — DILTIAZEM HCL ER COATED BEADS 120 MG PO CP24: 120.0000 mg | ORAL_CAPSULE | Freq: Every day | ORAL | 5 refills | Status: DC

## 2021-11-02 MED ORDER — HYDROCODONE BIT-HOMATROP MBR 5-1.5 MG/5ML PO SOLN
5.0000 mL | Freq: Three times a day (TID) | ORAL | 0 refills | Status: DC | PRN
Start: 2021-11-02 — End: 2022-01-22

## 2021-11-02 NOTE — Patient Instructions (Signed)
Give some feedback in 2-3 weeks after starting the new BP medication.

## 2021-11-02 NOTE — Progress Notes (Signed)
Established Patient Office Visit  Subjective   Patient ID: [name], female    DOB: April 17, 1927  Age: 88 y.o. MRN: [id number]  Chief Complaint  Patient presents with   Follow-up    HPI   Seen for follow-up regarding probable community-acquired pneumonia left lower lung.  Patient was treated with Levaquin along with steroids and home nebulizer.  Cough has definitely improved.  Still has some cough at night and requesting refill cough medication.  They are using nebulizer less often.  Less wheezing.  Cough has been somewhat productive.  O2 sats consistently 95% or higher  She did have hyponatremia with sodium 123.  We discontinued HCTZ.  We suspect her hyponatremia was related to the thiazide along with poor intake.  Increased her losartan to 100 mg daily.  She has had tendencies toward bilateral leg and ankle edema for some time and we had written for furosemide 20 mg daily as needed for edema.  Repeat sodium increased from 123-128.  Her appetite is improving.  No fever.  We also set up home PT and OT to further assess.  They are trying to increase her strength and that does seem to be increasing some past week.  Blood pressures though have remained consistently elevated mostly 892 systolic and sometimes higher  Past Medical History:  Diagnosis Date   BACK PAIN, LUMBAR, CHRONIC 02/20/2010   Cardiomegaly 03/16/2010   COLONIC POLYPS 02/20/2010   DIVERTICULITIS, HX OF 02/20/2010   GERD 02/20/2010   GOITER 02/20/2010   HYPERLIPIDEMIA 02/20/2010   OSTEOARTHRITIS, MULTIPLE JOINTS 02/20/2010   URINARY INCONTINENCE 02/20/2010   Past Surgical History:  Procedure Laterality Date   ABDOMINAL HYSTERECTOMY     NOSE SURGERY  1975   head surgery to repair cracked bone over nose    SPINE SURGERY     4 back surgeries   THYROIDECTOMY, PARTIAL     ? goiter, no CA   TONSILLECTOMY  1936   TOTAL SHOULDER ARTHROPLASTY  07/30/2011   Procedure: TOTAL SHOULDER ARTHROPLASTY;  Surgeon: Augustin Schooling, MD;   Location: [facility];  Service: Orthopedics;  Laterality: Right;  Right Reversed Total Shoulder    reports that she has never smoked. She has never used smokeless tobacco. She reports that she does not drink alcohol and does not use drugs. family history includes Cancer in her mother; Diabetes in her mother; Hyperlipidemia in her mother. No Known Allergies  Review of Systems  Constitutional:  Negative for chills, fever and weight loss.  Respiratory:  Positive for cough.   Cardiovascular:  Negative for chest pain.  Genitourinary:  Negative for dysuria.      Objective:     BP 124/80 (BP Location: Left Arm, Patient Position: Sitting, Cuff Size: Normal)   Pulse 79   Temp 97.6 F (36.4 C) (Oral)   SpO2 95%  BP Readings from Last 3 Encounters:  11/02/21 124/80  10/21/21 126/62  10/13/21 138/80   Wt Readings from Last 3 Encounters:  04/30/21 165 lb (74.8 kg)  04/24/20 157 lb 3 oz (71.3 kg)  08/21/18 159 lb 6.4 oz (72.3 kg)      Physical Exam Vitals reviewed.  Constitutional:      General: She is not in acute distress.    Appearance: Normal appearance. She is not ill-appearing.  Cardiovascular:     Rate and Rhythm: Normal rate and regular rhythm.  Pulmonary:     Comments: No wheezes.  She does have some crackles and rhonchi in both  bases. Musculoskeletal:     Comments: Only trace nonpitting edema ankles and lower legs bilaterally  Neurological:     Mental Status: She is alert.      No results found for any visits on 11/02/21.    The ASCVD Risk score (Arnett DK, et al., 2019) failed to calculate for the following reasons:   The 2019 ASCVD risk score is only valid for ages 14 to 41    Assessment & Plan:   #1 recent cough with probable community-acquired left lower lobe pneumonia.  Clinically improving.  O2 sats are stable.  No fever.  Cough symptoms improving.  Continue nebulizer as needed.  Follow-up immediately for any fever or other concerns  #2 hyponatremia  probably related to thiazide use along with poor intake.  Blood sodium improving with recent lab.  She is now off thiazide.  Recheck basic metabolic panel once more today  #3 hypertension poorly controlled by multiple elevated home readings.  Also repeat reading here today after rest seated 158/80 right arm seated -Continue losartan 100 mg daily -add Cardizem CD 120 mg once daily-watch for any edema issues.  Try to avoid thiazides with her history of hyponatremia -Give feedback on blood pressures next few weeks.  May need further titration.   No follow-ups on file.    Carolann Littler, MD

## 2021-11-03 LAB — BASIC METABOLIC PANEL
BUN: 14 mg/dL (ref 6–23)
CO2: 25 mEq/L (ref 19–32)
Calcium: 9 mg/dL (ref 8.4–10.5)
Chloride: 90 mEq/L — ABNORMAL LOW (ref 96–112)
Creatinine, Ser: 0.72 mg/dL (ref 0.40–1.20)
GFR: 72.77 mL/min (ref >60.00)
Glucose, Bld: 86 mg/dL (ref 70–99)
Potassium: 4.3 mEq/L (ref 3.5–5.1)
Sodium: 124 mEq/L — ABNORMAL LOW (ref 135–145)

## 2021-11-03 NOTE — Addendum Note (Signed)
Addended by: Eulas Post on: 11/03/2021 05:10 PM   Modules accepted: Orders

## 2021-11-03 NOTE — Addendum Note (Signed)
Addended by: Rosalee Kaufman L on: 11/03/2021 02:00 PM   Modules accepted: Orders

## 2021-11-04 DIAGNOSIS — M545 Low back pain, unspecified: Secondary | ICD-10-CM | POA: Diagnosis not present

## 2021-11-04 DIAGNOSIS — J189 Pneumonia, unspecified organism: Secondary | ICD-10-CM | POA: Diagnosis not present

## 2021-11-04 DIAGNOSIS — G8929 Other chronic pain: Secondary | ICD-10-CM | POA: Diagnosis not present

## 2021-11-04 DIAGNOSIS — I119 Hypertensive heart disease without heart failure: Secondary | ICD-10-CM | POA: Diagnosis not present

## 2021-11-04 DIAGNOSIS — E871 Hypo-osmolality and hyponatremia: Secondary | ICD-10-CM | POA: Diagnosis not present

## 2021-11-04 DIAGNOSIS — M159 Polyosteoarthritis, unspecified: Secondary | ICD-10-CM | POA: Diagnosis not present

## 2021-11-05 DIAGNOSIS — J189 Pneumonia, unspecified organism: Secondary | ICD-10-CM | POA: Diagnosis not present

## 2021-11-05 DIAGNOSIS — M159 Polyosteoarthritis, unspecified: Secondary | ICD-10-CM | POA: Diagnosis not present

## 2021-11-05 DIAGNOSIS — G8929 Other chronic pain: Secondary | ICD-10-CM | POA: Diagnosis not present

## 2021-11-05 DIAGNOSIS — E871 Hypo-osmolality and hyponatremia: Secondary | ICD-10-CM | POA: Diagnosis not present

## 2021-11-05 DIAGNOSIS — M545 Low back pain, unspecified: Secondary | ICD-10-CM | POA: Diagnosis not present

## 2021-11-05 DIAGNOSIS — I119 Hypertensive heart disease without heart failure: Secondary | ICD-10-CM | POA: Diagnosis not present

## 2021-11-09 DIAGNOSIS — I119 Hypertensive heart disease without heart failure: Secondary | ICD-10-CM | POA: Diagnosis not present

## 2021-11-09 DIAGNOSIS — E871 Hypo-osmolality and hyponatremia: Secondary | ICD-10-CM | POA: Diagnosis not present

## 2021-11-09 DIAGNOSIS — M545 Low back pain, unspecified: Secondary | ICD-10-CM | POA: Diagnosis not present

## 2021-11-09 DIAGNOSIS — G8929 Other chronic pain: Secondary | ICD-10-CM | POA: Diagnosis not present

## 2021-11-09 DIAGNOSIS — J189 Pneumonia, unspecified organism: Secondary | ICD-10-CM | POA: Diagnosis not present

## 2021-11-09 DIAGNOSIS — M159 Polyosteoarthritis, unspecified: Secondary | ICD-10-CM | POA: Diagnosis not present

## 2021-11-10 DIAGNOSIS — J189 Pneumonia, unspecified organism: Secondary | ICD-10-CM | POA: Diagnosis not present

## 2021-11-10 DIAGNOSIS — I119 Hypertensive heart disease without heart failure: Secondary | ICD-10-CM | POA: Diagnosis not present

## 2021-11-10 DIAGNOSIS — M159 Polyosteoarthritis, unspecified: Secondary | ICD-10-CM | POA: Diagnosis not present

## 2021-11-10 DIAGNOSIS — E871 Hypo-osmolality and hyponatremia: Secondary | ICD-10-CM | POA: Diagnosis not present

## 2021-11-10 DIAGNOSIS — M545 Low back pain, unspecified: Secondary | ICD-10-CM | POA: Diagnosis not present

## 2021-11-10 DIAGNOSIS — G8929 Other chronic pain: Secondary | ICD-10-CM | POA: Diagnosis not present

## 2021-11-12 DIAGNOSIS — M545 Low back pain, unspecified: Secondary | ICD-10-CM | POA: Diagnosis not present

## 2021-11-12 DIAGNOSIS — I119 Hypertensive heart disease without heart failure: Secondary | ICD-10-CM | POA: Diagnosis not present

## 2021-11-12 DIAGNOSIS — E871 Hypo-osmolality and hyponatremia: Secondary | ICD-10-CM | POA: Diagnosis not present

## 2021-11-12 DIAGNOSIS — J189 Pneumonia, unspecified organism: Secondary | ICD-10-CM | POA: Diagnosis not present

## 2021-11-12 DIAGNOSIS — M159 Polyosteoarthritis, unspecified: Secondary | ICD-10-CM | POA: Diagnosis not present

## 2021-11-12 DIAGNOSIS — G8929 Other chronic pain: Secondary | ICD-10-CM | POA: Diagnosis not present

## 2021-11-16 DIAGNOSIS — M545 Low back pain, unspecified: Secondary | ICD-10-CM | POA: Diagnosis not present

## 2021-11-16 DIAGNOSIS — G8929 Other chronic pain: Secondary | ICD-10-CM | POA: Diagnosis not present

## 2021-11-16 DIAGNOSIS — J189 Pneumonia, unspecified organism: Secondary | ICD-10-CM | POA: Diagnosis not present

## 2021-11-16 DIAGNOSIS — M159 Polyosteoarthritis, unspecified: Secondary | ICD-10-CM | POA: Diagnosis not present

## 2021-11-16 DIAGNOSIS — I119 Hypertensive heart disease without heart failure: Secondary | ICD-10-CM | POA: Diagnosis not present

## 2021-11-16 DIAGNOSIS — E871 Hypo-osmolality and hyponatremia: Secondary | ICD-10-CM | POA: Diagnosis not present

## 2021-11-17 ENCOUNTER — Telehealth: Payer: Self-pay | Admitting: Family Medicine

## 2021-11-17 DIAGNOSIS — J189 Pneumonia, unspecified organism: Secondary | ICD-10-CM | POA: Diagnosis not present

## 2021-11-17 DIAGNOSIS — M545 Low back pain, unspecified: Secondary | ICD-10-CM | POA: Diagnosis not present

## 2021-11-17 DIAGNOSIS — M159 Polyosteoarthritis, unspecified: Secondary | ICD-10-CM | POA: Diagnosis not present

## 2021-11-17 DIAGNOSIS — E871 Hypo-osmolality and hyponatremia: Secondary | ICD-10-CM | POA: Diagnosis not present

## 2021-11-17 DIAGNOSIS — G8929 Other chronic pain: Secondary | ICD-10-CM | POA: Diagnosis not present

## 2021-11-17 DIAGNOSIS — I119 Hypertensive heart disease without heart failure: Secondary | ICD-10-CM | POA: Diagnosis not present

## 2021-11-17 NOTE — Telephone Encounter (Signed)
[name] with [facility] [phone] (ok to leave a detailed message)  As per Otila Kluver,  Pt's daughter Josephina Shih) requesting SNF placement  VO: Medical SW to assist with that

## 2021-11-18 NOTE — Telephone Encounter (Signed)
Otila Kluver informed of verbal orders

## 2021-11-19 DIAGNOSIS — J189 Pneumonia, unspecified organism: Secondary | ICD-10-CM | POA: Diagnosis not present

## 2021-11-19 DIAGNOSIS — M545 Low back pain, unspecified: Secondary | ICD-10-CM | POA: Diagnosis not present

## 2021-11-19 DIAGNOSIS — I119 Hypertensive heart disease without heart failure: Secondary | ICD-10-CM | POA: Diagnosis not present

## 2021-11-19 DIAGNOSIS — E871 Hypo-osmolality and hyponatremia: Secondary | ICD-10-CM | POA: Diagnosis not present

## 2021-11-19 DIAGNOSIS — G8929 Other chronic pain: Secondary | ICD-10-CM | POA: Diagnosis not present

## 2021-11-19 DIAGNOSIS — M159 Polyosteoarthritis, unspecified: Secondary | ICD-10-CM | POA: Diagnosis not present

## 2021-11-24 ENCOUNTER — Other Ambulatory Visit: Payer: Medicare Other

## 2021-11-24 DIAGNOSIS — E871 Hypo-osmolality and hyponatremia: Secondary | ICD-10-CM | POA: Diagnosis not present

## 2021-11-24 LAB — BASIC METABOLIC PANEL
BUN: 12 mg/dL (ref 6–23)
CO2: 28 mEq/L (ref 19–32)
Calcium: 8.9 mg/dL (ref 8.4–10.5)
Chloride: 95 mEq/L — ABNORMAL LOW (ref 96–112)
Creatinine, Ser: 0.56 mg/dL (ref 0.40–1.20)
GFR: 79.4 mL/min (ref >60.00)
Glucose, Bld: 94 mg/dL (ref 70–99)
Potassium: 4.3 mEq/L (ref 3.5–5.1)
Sodium: 128 mEq/L — ABNORMAL LOW (ref 135–145)

## 2021-11-24 NOTE — Addendum Note (Signed)
Addended by: Rosalyn Gess D on: 11/24/2021 02:36 PM   Modules accepted: Orders

## 2021-11-25 ENCOUNTER — Telehealth: Payer: Self-pay | Admitting: Family Medicine

## 2021-11-25 DIAGNOSIS — E890 Postprocedural hypothyroidism: Secondary | ICD-10-CM | POA: Diagnosis not present

## 2021-11-25 DIAGNOSIS — G8929 Other chronic pain: Secondary | ICD-10-CM | POA: Diagnosis not present

## 2021-11-25 DIAGNOSIS — E785 Hyperlipidemia, unspecified: Secondary | ICD-10-CM | POA: Diagnosis not present

## 2021-11-25 DIAGNOSIS — J189 Pneumonia, unspecified organism: Secondary | ICD-10-CM | POA: Diagnosis not present

## 2021-11-25 DIAGNOSIS — Z7901 Long term (current) use of anticoagulants: Secondary | ICD-10-CM | POA: Diagnosis not present

## 2021-11-25 DIAGNOSIS — M545 Low back pain, unspecified: Secondary | ICD-10-CM | POA: Diagnosis not present

## 2021-11-25 DIAGNOSIS — K219 Gastro-esophageal reflux disease without esophagitis: Secondary | ICD-10-CM | POA: Diagnosis not present

## 2021-11-25 DIAGNOSIS — Z79891 Long term (current) use of opiate analgesic: Secondary | ICD-10-CM | POA: Diagnosis not present

## 2021-11-25 DIAGNOSIS — I119 Hypertensive heart disease without heart failure: Secondary | ICD-10-CM | POA: Diagnosis not present

## 2021-11-25 DIAGNOSIS — M159 Polyosteoarthritis, unspecified: Secondary | ICD-10-CM | POA: Diagnosis not present

## 2021-11-25 DIAGNOSIS — E871 Hypo-osmolality and hyponatremia: Secondary | ICD-10-CM | POA: Diagnosis not present

## 2021-11-25 DIAGNOSIS — R32 Unspecified urinary incontinence: Secondary | ICD-10-CM | POA: Diagnosis not present

## 2021-11-25 DIAGNOSIS — Z993 Dependence on wheelchair: Secondary | ICD-10-CM | POA: Diagnosis not present

## 2021-11-25 LAB — OSMOLALITY: Osmolality: 270 mOsm/kg — ABNORMAL LOW (ref 278–305)

## 2021-11-25 MED ORDER — APIXABAN 5 MG PO TABS: ORAL_TABLET | ORAL | 3 refills | Status: DC

## 2021-11-25 NOTE — Telephone Encounter (Signed)
I spoke with the patient's daughter and she states they are unable to collect urine from the patient as the patient is unaware of when she needs to urinate. Patient's daughter requested alternative lab test to replace Sodium 24 hour urine.

## 2021-11-25 NOTE — Telephone Encounter (Signed)
Patient's daughter informed of the message and expressed understanding

## 2021-11-25 NOTE — Telephone Encounter (Signed)
Pt's daughter [name]) called to say Pt was here yesterday for labs and was given a kit to catch urine for 24 hrs and she needs clarification &/or another way to do it.  Please call her back at your earliest convenience.

## 2021-11-25 NOTE — Addendum Note (Signed)
Addended by: Nilda Riggs on: 11/25/2021 03:25 PM   Modules accepted: Orders

## 2021-12-04 DIAGNOSIS — G8929 Other chronic pain: Secondary | ICD-10-CM | POA: Diagnosis not present

## 2021-12-04 DIAGNOSIS — I119 Hypertensive heart disease without heart failure: Secondary | ICD-10-CM | POA: Diagnosis not present

## 2021-12-04 DIAGNOSIS — M545 Low back pain, unspecified: Secondary | ICD-10-CM | POA: Diagnosis not present

## 2021-12-04 DIAGNOSIS — M159 Polyosteoarthritis, unspecified: Secondary | ICD-10-CM | POA: Diagnosis not present

## 2021-12-04 DIAGNOSIS — E871 Hypo-osmolality and hyponatremia: Secondary | ICD-10-CM | POA: Diagnosis not present

## 2021-12-04 DIAGNOSIS — J189 Pneumonia, unspecified organism: Secondary | ICD-10-CM | POA: Diagnosis not present

## 2021-12-07 DIAGNOSIS — M159 Polyosteoarthritis, unspecified: Secondary | ICD-10-CM | POA: Diagnosis not present

## 2021-12-07 DIAGNOSIS — E871 Hypo-osmolality and hyponatremia: Secondary | ICD-10-CM | POA: Diagnosis not present

## 2021-12-07 DIAGNOSIS — I119 Hypertensive heart disease without heart failure: Secondary | ICD-10-CM | POA: Diagnosis not present

## 2021-12-07 DIAGNOSIS — J189 Pneumonia, unspecified organism: Secondary | ICD-10-CM | POA: Diagnosis not present

## 2021-12-07 DIAGNOSIS — G8929 Other chronic pain: Secondary | ICD-10-CM | POA: Diagnosis not present

## 2021-12-07 DIAGNOSIS — M545 Low back pain, unspecified: Secondary | ICD-10-CM | POA: Diagnosis not present

## 2021-12-15 DIAGNOSIS — M545 Low back pain, unspecified: Secondary | ICD-10-CM | POA: Diagnosis not present

## 2021-12-15 DIAGNOSIS — G8929 Other chronic pain: Secondary | ICD-10-CM | POA: Diagnosis not present

## 2021-12-15 DIAGNOSIS — E871 Hypo-osmolality and hyponatremia: Secondary | ICD-10-CM | POA: Diagnosis not present

## 2021-12-15 DIAGNOSIS — J189 Pneumonia, unspecified organism: Secondary | ICD-10-CM | POA: Diagnosis not present

## 2021-12-15 DIAGNOSIS — I119 Hypertensive heart disease without heart failure: Secondary | ICD-10-CM | POA: Diagnosis not present

## 2021-12-15 DIAGNOSIS — M159 Polyosteoarthritis, unspecified: Secondary | ICD-10-CM | POA: Diagnosis not present

## 2021-12-21 DIAGNOSIS — M159 Polyosteoarthritis, unspecified: Secondary | ICD-10-CM | POA: Diagnosis not present

## 2021-12-21 DIAGNOSIS — J189 Pneumonia, unspecified organism: Secondary | ICD-10-CM | POA: Diagnosis not present

## 2021-12-21 DIAGNOSIS — E871 Hypo-osmolality and hyponatremia: Secondary | ICD-10-CM | POA: Diagnosis not present

## 2021-12-21 DIAGNOSIS — I119 Hypertensive heart disease without heart failure: Secondary | ICD-10-CM | POA: Diagnosis not present

## 2021-12-21 DIAGNOSIS — M545 Low back pain, unspecified: Secondary | ICD-10-CM | POA: Diagnosis not present

## 2021-12-21 DIAGNOSIS — G8929 Other chronic pain: Secondary | ICD-10-CM | POA: Diagnosis not present

## 2021-12-29 DIAGNOSIS — Z23 Encounter for immunization: Secondary | ICD-10-CM | POA: Diagnosis not present

## 2022-01-05 ENCOUNTER — Telehealth: Payer: Self-pay | Admitting: Family Medicine

## 2022-01-05 NOTE — Telephone Encounter (Signed)
Patient daughter stopped by to pick up FL2 form. Form was given to patient's daughter. Daughter stated that she needs the nursing box to unchecked and instead the "other" box needs to have a check and assisted living written on the line. She also needs the date to reflect the date that she intends to pick it up or a sooner date.    FL2 was given back to CMA for adjustments and daughter was informed that the adjustments may not be done today as Dr.Burchette is in a room with a patient and it is the end of the day. Daughter was informed this when she stated that she would wait for forms to be redone by Dr.Burchette.    Please advise

## 2022-01-05 NOTE — Telephone Encounter (Signed)
Placed in red folder  

## 2022-01-06 NOTE — Telephone Encounter (Signed)
Placed in front office for pickup

## 2022-01-13 ENCOUNTER — Other Ambulatory Visit: Payer: Self-pay | Admitting: Family Medicine

## 2022-01-18 DIAGNOSIS — M545 Low back pain, unspecified: Secondary | ICD-10-CM | POA: Diagnosis not present

## 2022-01-18 DIAGNOSIS — M1990 Unspecified osteoarthritis, unspecified site: Secondary | ICD-10-CM | POA: Diagnosis not present

## 2022-01-18 DIAGNOSIS — D508 Other iron deficiency anemias: Secondary | ICD-10-CM | POA: Diagnosis not present

## 2022-01-18 DIAGNOSIS — R5381 Other malaise: Secondary | ICD-10-CM | POA: Diagnosis not present

## 2022-01-18 DIAGNOSIS — G4700 Insomnia, unspecified: Secondary | ICD-10-CM | POA: Diagnosis not present

## 2022-01-18 DIAGNOSIS — I10 Essential (primary) hypertension: Secondary | ICD-10-CM | POA: Diagnosis not present

## 2022-01-18 DIAGNOSIS — G2581 Restless legs syndrome: Secondary | ICD-10-CM | POA: Diagnosis not present

## 2022-01-18 DIAGNOSIS — R0989 Other specified symptoms and signs involving the circulatory and respiratory systems: Secondary | ICD-10-CM | POA: Diagnosis not present

## 2022-01-18 DIAGNOSIS — G8929 Other chronic pain: Secondary | ICD-10-CM | POA: Diagnosis not present

## 2022-01-18 DIAGNOSIS — R42 Dizziness and giddiness: Secondary | ICD-10-CM | POA: Diagnosis not present

## 2022-01-19 DIAGNOSIS — J90 Pleural effusion, not elsewhere classified: Secondary | ICD-10-CM | POA: Diagnosis not present

## 2022-01-19 DIAGNOSIS — D508 Other iron deficiency anemias: Secondary | ICD-10-CM | POA: Diagnosis not present

## 2022-01-19 DIAGNOSIS — R7889 Finding of other specified substances, not normally found in blood: Secondary | ICD-10-CM | POA: Diagnosis not present

## 2022-01-19 DIAGNOSIS — R5381 Other malaise: Secondary | ICD-10-CM | POA: Diagnosis not present

## 2022-01-19 DIAGNOSIS — I10 Essential (primary) hypertension: Secondary | ICD-10-CM | POA: Diagnosis not present

## 2022-01-19 DIAGNOSIS — E871 Hypo-osmolality and hyponatremia: Secondary | ICD-10-CM | POA: Diagnosis not present

## 2022-01-19 DIAGNOSIS — D640 Hereditary sideroblastic anemia: Secondary | ICD-10-CM | POA: Diagnosis not present

## 2022-01-19 DIAGNOSIS — G4700 Insomnia, unspecified: Secondary | ICD-10-CM | POA: Diagnosis not present

## 2022-01-19 DIAGNOSIS — E559 Vitamin D deficiency, unspecified: Secondary | ICD-10-CM | POA: Diagnosis not present

## 2022-01-19 DIAGNOSIS — J189 Pneumonia, unspecified organism: Secondary | ICD-10-CM | POA: Diagnosis not present

## 2022-01-19 DIAGNOSIS — E1100 Type 2 diabetes mellitus with hyperosmolarity without nonketotic hyperglycemic-hyperosmolar coma (NKHHC): Secondary | ICD-10-CM | POA: Diagnosis not present

## 2022-01-20 DIAGNOSIS — G8929 Other chronic pain: Secondary | ICD-10-CM | POA: Diagnosis not present

## 2022-01-20 DIAGNOSIS — K5900 Constipation, unspecified: Secondary | ICD-10-CM | POA: Diagnosis not present

## 2022-01-20 DIAGNOSIS — G4700 Insomnia, unspecified: Secondary | ICD-10-CM | POA: Diagnosis not present

## 2022-01-20 DIAGNOSIS — D508 Other iron deficiency anemias: Secondary | ICD-10-CM | POA: Diagnosis not present

## 2022-01-20 DIAGNOSIS — E871 Hypo-osmolality and hyponatremia: Secondary | ICD-10-CM | POA: Diagnosis not present

## 2022-01-20 DIAGNOSIS — J189 Pneumonia, unspecified organism: Secondary | ICD-10-CM | POA: Diagnosis not present

## 2022-01-20 DIAGNOSIS — I10 Essential (primary) hypertension: Secondary | ICD-10-CM | POA: Diagnosis not present

## 2022-01-20 DIAGNOSIS — G2581 Restless legs syndrome: Secondary | ICD-10-CM | POA: Diagnosis not present

## 2022-01-20 DIAGNOSIS — J90 Pleural effusion, not elsewhere classified: Secondary | ICD-10-CM | POA: Diagnosis not present

## 2022-01-20 DIAGNOSIS — M545 Low back pain, unspecified: Secondary | ICD-10-CM | POA: Diagnosis not present

## 2022-01-20 DIAGNOSIS — M1990 Unspecified osteoarthritis, unspecified site: Secondary | ICD-10-CM | POA: Diagnosis not present

## 2022-01-21 DIAGNOSIS — I10 Essential (primary) hypertension: Secondary | ICD-10-CM | POA: Diagnosis not present

## 2022-01-21 DIAGNOSIS — N390 Urinary tract infection, site not specified: Secondary | ICD-10-CM | POA: Diagnosis not present

## 2022-01-22 ENCOUNTER — Emergency Department (HOSPITAL_COMMUNITY): Payer: Medicare Other

## 2022-01-22 ENCOUNTER — Other Ambulatory Visit: Payer: Self-pay

## 2022-01-22 ENCOUNTER — Encounter (HOSPITAL_COMMUNITY): Payer: Self-pay

## 2022-01-22 ENCOUNTER — Inpatient Hospital Stay (HOSPITAL_COMMUNITY)
Admission: EM | Admit: 2022-01-22 | Discharge: 2022-01-26 | DRG: 194 | Disposition: A | Discharge status: Other Acute Inpatient Hospital | Payer: Medicare Other | Source: Skilled Nursing Facility | Attending: Internal Medicine | Admitting: Internal Medicine

## 2022-01-22 DIAGNOSIS — R0682 Tachypnea, not elsewhere classified: Secondary | ICD-10-CM | POA: Diagnosis present

## 2022-01-22 DIAGNOSIS — R5381 Other malaise: Secondary | ICD-10-CM | POA: Diagnosis present

## 2022-01-22 DIAGNOSIS — Z7901 Long term (current) use of anticoagulants: Secondary | ICD-10-CM | POA: Diagnosis not present

## 2022-01-22 DIAGNOSIS — R41841 Cognitive communication deficit: Secondary | ICD-10-CM | POA: Diagnosis not present

## 2022-01-22 DIAGNOSIS — R059 Cough, unspecified: Secondary | ICD-10-CM | POA: Diagnosis not present

## 2022-01-22 DIAGNOSIS — E861 Hypovolemia: Secondary | ICD-10-CM | POA: Diagnosis present

## 2022-01-22 DIAGNOSIS — I10 Essential (primary) hypertension: Secondary | ICD-10-CM | POA: Diagnosis present

## 2022-01-22 DIAGNOSIS — G2581 Restless legs syndrome: Secondary | ICD-10-CM | POA: Diagnosis present

## 2022-01-22 DIAGNOSIS — I700 Atherosclerosis of aorta: Secondary | ICD-10-CM | POA: Diagnosis not present

## 2022-01-22 DIAGNOSIS — Z20822 Contact with and (suspected) exposure to covid-19: Secondary | ICD-10-CM | POA: Diagnosis present

## 2022-01-22 DIAGNOSIS — Z7401 Bed confinement status: Secondary | ICD-10-CM | POA: Diagnosis not present

## 2022-01-22 DIAGNOSIS — Z66 Do not resuscitate: Secondary | ICD-10-CM | POA: Diagnosis present

## 2022-01-22 DIAGNOSIS — I959 Hypotension, unspecified: Secondary | ICD-10-CM | POA: Diagnosis not present

## 2022-01-22 DIAGNOSIS — Z8719 Personal history of other diseases of the digestive system: Secondary | ICD-10-CM

## 2022-01-22 DIAGNOSIS — R54 Age-related physical debility: Secondary | ICD-10-CM | POA: Diagnosis present

## 2022-01-22 DIAGNOSIS — Z79899 Other long term (current) drug therapy: Secondary | ICD-10-CM

## 2022-01-22 DIAGNOSIS — R600 Localized edema: Secondary | ICD-10-CM | POA: Diagnosis present

## 2022-01-22 DIAGNOSIS — Z86711 Personal history of pulmonary embolism: Secondary | ICD-10-CM | POA: Diagnosis present

## 2022-01-22 DIAGNOSIS — Z8041 Family history of malignant neoplasm of ovary: Secondary | ICD-10-CM | POA: Diagnosis not present

## 2022-01-22 DIAGNOSIS — R404 Transient alteration of awareness: Secondary | ICD-10-CM | POA: Diagnosis not present

## 2022-01-22 DIAGNOSIS — E785 Hyperlipidemia, unspecified: Secondary | ICD-10-CM | POA: Diagnosis present

## 2022-01-22 DIAGNOSIS — Z833 Family history of diabetes mellitus: Secondary | ICD-10-CM | POA: Diagnosis not present

## 2022-01-22 DIAGNOSIS — J9811 Atelectasis: Secondary | ICD-10-CM | POA: Diagnosis present

## 2022-01-22 DIAGNOSIS — Z86010 Personal history of colonic polyps: Secondary | ICD-10-CM

## 2022-01-22 DIAGNOSIS — D649 Anemia, unspecified: Secondary | ICD-10-CM | POA: Diagnosis not present

## 2022-01-22 DIAGNOSIS — R0902 Hypoxemia: Secondary | ICD-10-CM | POA: Diagnosis not present

## 2022-01-22 DIAGNOSIS — Z993 Dependence on wheelchair: Secondary | ICD-10-CM

## 2022-01-22 DIAGNOSIS — K219 Gastro-esophageal reflux disease without esophagitis: Secondary | ICD-10-CM | POA: Diagnosis present

## 2022-01-22 DIAGNOSIS — S2242XA Multiple fractures of ribs, left side, initial encounter for closed fracture: Secondary | ICD-10-CM | POA: Diagnosis present

## 2022-01-22 DIAGNOSIS — R4182 Altered mental status, unspecified: Secondary | ICD-10-CM | POA: Diagnosis not present

## 2022-01-22 DIAGNOSIS — K449 Diaphragmatic hernia without obstruction or gangrene: Secondary | ICD-10-CM | POA: Diagnosis present

## 2022-01-22 DIAGNOSIS — M159 Polyosteoarthritis, unspecified: Secondary | ICD-10-CM | POA: Diagnosis present

## 2022-01-22 DIAGNOSIS — M545 Low back pain, unspecified: Secondary | ICD-10-CM | POA: Diagnosis present

## 2022-01-22 DIAGNOSIS — Z7409 Other reduced mobility: Secondary | ICD-10-CM | POA: Diagnosis not present

## 2022-01-22 DIAGNOSIS — J189 Pneumonia, unspecified organism: Secondary | ICD-10-CM | POA: Diagnosis not present

## 2022-01-22 DIAGNOSIS — Z96611 Presence of right artificial shoulder joint: Secondary | ICD-10-CM | POA: Diagnosis present

## 2022-01-22 DIAGNOSIS — X58XXXA Exposure to other specified factors, initial encounter: Secondary | ICD-10-CM | POA: Diagnosis present

## 2022-01-22 DIAGNOSIS — E049 Nontoxic goiter, unspecified: Secondary | ICD-10-CM | POA: Diagnosis present

## 2022-01-22 DIAGNOSIS — Z90710 Acquired absence of both cervix and uterus: Secondary | ICD-10-CM

## 2022-01-22 DIAGNOSIS — R1312 Dysphagia, oropharyngeal phase: Secondary | ICD-10-CM | POA: Diagnosis not present

## 2022-01-22 DIAGNOSIS — Z83438 Family history of other disorder of lipoprotein metabolism and other lipidemia: Secondary | ICD-10-CM | POA: Diagnosis not present

## 2022-01-22 DIAGNOSIS — E871 Hypo-osmolality and hyponatremia: Secondary | ICD-10-CM | POA: Diagnosis present

## 2022-01-22 DIAGNOSIS — Z741 Need for assistance with personal care: Secondary | ICD-10-CM | POA: Diagnosis not present

## 2022-01-22 DIAGNOSIS — N390 Urinary tract infection, site not specified: Secondary | ICD-10-CM | POA: Diagnosis not present

## 2022-01-22 LAB — TROPONIN I (HIGH SENSITIVITY): Troponin I (High Sensitivity): 6 ng/L (ref <18)

## 2022-01-22 LAB — BRAIN NATRIURETIC PEPTIDE: B Natriuretic Peptide: 23.8 pg/mL (ref 0.0–100.0)

## 2022-01-22 LAB — CBC WITH DIFFERENTIAL/PLATELET
Abs Immature Granulocytes: 0.02 10*3/uL (ref 0.00–0.07)
Basophils Absolute: 0 10*3/uL (ref 0.0–0.1)
Basophils Relative: 0 %
Eosinophils Absolute: 0.2 10*3/uL (ref 0.0–0.5)
Eosinophils Relative: 2 %
HCT: 36.6 % (ref 36.0–46.0)
Hemoglobin: 12.1 g/dL (ref 12.0–15.0)
Immature Granulocytes: 0 %
Lymphocytes Relative: 22 %
Lymphs Abs: 2 10*3/uL (ref 0.7–4.0)
MCH: 28.5 pg (ref 26.0–34.0)
MCHC: 33.1 g/dL (ref 30.0–36.0)
MCV: 86.1 fL (ref 80.0–100.0)
Monocytes Absolute: 1.3 10*3/uL — ABNORMAL HIGH (ref 0.1–1.0)
Monocytes Relative: 15 %
Neutro Abs: 5.5 10*3/uL (ref 1.7–7.7)
Neutrophils Relative %: 61 %
Platelets: 488 10*3/uL — ABNORMAL HIGH (ref 150–400)
RBC: 4.25 MIL/uL (ref 3.87–5.11)
RDW: 13.7 % (ref 11.5–15.5)
WBC: 9 10*3/uL (ref 4.0–10.5)
nRBC: 0 % (ref 0.0–0.2)

## 2022-01-22 LAB — COMPREHENSIVE METABOLIC PANEL
ALT: 24 U/L (ref 0–44)
AST: 36 U/L (ref 15–41)
Albumin: 3.3 g/dL — ABNORMAL LOW (ref 3.5–5.0)
Alkaline Phosphatase: 57 U/L (ref 38–126)
Anion gap: 10 (ref 5–15)
BUN: 15 mg/dL (ref 8–23)
CO2: 25 mmol/L (ref 22–32)
Calcium: 8.9 mg/dL (ref 8.9–10.3)
Chloride: 92 mmol/L — ABNORMAL LOW (ref 98–111)
Creatinine, Ser: 0.7 mg/dL (ref 0.44–1.00)
GFR, Estimated: >60 mL/min (ref >60)
Glucose, Bld: 136 mg/dL — ABNORMAL HIGH (ref 70–99)
Potassium: 4.1 mmol/L (ref 3.5–5.1)
Sodium: 127 mmol/L — ABNORMAL LOW (ref 135–145)
Total Bilirubin: 0.5 mg/dL (ref 0.3–1.2)
Total Protein: 6.9 g/dL (ref 6.5–8.1)

## 2022-01-22 LAB — RESP PANEL BY RT-PCR (FLU A&B, COVID) ARPGX2
Influenza A by PCR: NEGATIVE
Influenza B by PCR: NEGATIVE
SARS Coronavirus 2 by RT PCR: NEGATIVE

## 2022-01-22 LAB — MAGNESIUM: Magnesium: 1.7 mg/dL (ref 1.7–2.4)

## 2022-01-22 LAB — AMMONIA: Ammonia: 21 umol/L (ref 9–35)

## 2022-01-22 LAB — LIPASE, BLOOD: Lipase: 51 U/L (ref 11–51)

## 2022-01-22 LAB — LACTIC ACID, PLASMA: Lactic Acid, Venous: 1.9 mmol/L (ref 0.5–1.9)

## 2022-01-22 MED ORDER — GUAIFENESIN ER 600 MG PO TB12
600.0000 mg | ORAL_TABLET | Freq: Two times a day (BID) | ORAL | Status: DC
  Administered 2022-01-22 – 2022-01-26 (×8): 600 mg via ORAL
  Filled 2022-01-22 (×8): qty 1

## 2022-01-22 MED ORDER — MELATONIN 5 MG PO TABS
5.0000 mg | ORAL_TABLET | Freq: Every day | ORAL | Status: DC
  Administered 2022-01-22 – 2022-01-25 (×4): 5 mg via ORAL
  Filled 2022-01-22 (×4): qty 1

## 2022-01-22 MED ORDER — SODIUM CHLORIDE 0.9 % IV SOLN
500.0000 mg | Freq: Once | INTRAVENOUS | Status: AC
  Administered 2022-01-22: 500 mg via INTRAVENOUS
  Filled 2022-01-22: qty 5

## 2022-01-22 MED ORDER — APIXABAN 5 MG PO TABS
5.0000 mg | ORAL_TABLET | Freq: Two times a day (BID) | ORAL | Status: DC
  Administered 2022-01-22 – 2022-01-26 (×8): 5 mg via ORAL
  Filled 2022-01-22 (×8): qty 1

## 2022-01-22 MED ORDER — SODIUM CHLORIDE 0.9 % IV SOLN: 500.0000 mg | INTRAVENOUS | Status: DC

## 2022-01-22 MED ORDER — SENNOSIDES-DOCUSATE SODIUM 8.6-50 MG PO TABS: 1.0000 | ORAL_TABLET | Freq: Every evening | ORAL | Status: DC | PRN

## 2022-01-22 MED ORDER — BACLOFEN 10 MG PO TABS
10.0000 mg | ORAL_TABLET | Freq: Every day | ORAL | Status: DC
  Administered 2022-01-22 – 2022-01-25 (×4): 10 mg via ORAL
  Filled 2022-01-22 (×4): qty 1

## 2022-01-22 MED ORDER — ONDANSETRON HCL 4 MG/2ML IJ SOLN: 4.0000 mg | Freq: Four times a day (QID) | INTRAMUSCULAR | Status: DC | PRN

## 2022-01-22 MED ORDER — SODIUM CHLORIDE 0.9 % IV SOLN
1.0000 g | Freq: Once | INTRAVENOUS | Status: AC
  Administered 2022-01-22: 1 g via INTRAVENOUS
  Filled 2022-01-22: qty 10

## 2022-01-22 MED ORDER — DILTIAZEM HCL ER COATED BEADS 120 MG PO CP24
120.0000 mg | ORAL_CAPSULE | Freq: Every day | ORAL | Status: DC
  Administered 2022-01-23 – 2022-01-25 (×3): 120 mg via ORAL
  Filled 2022-01-22 (×3): qty 1

## 2022-01-22 MED ORDER — SODIUM CHLORIDE 0.9 % IV SOLN
2.0000 g | INTRAVENOUS | Status: DC
  Administered 2022-01-23 – 2022-01-25 (×3): 2 g via INTRAVENOUS
  Filled 2022-01-22 (×4): qty 20

## 2022-01-22 MED ORDER — TRAMADOL HCL 50 MG PO TABS
50.0000 mg | ORAL_TABLET | Freq: Two times a day (BID) | ORAL | Status: DC | PRN
  Administered 2022-01-22 – 2022-01-23 (×2): 50 mg via ORAL
  Filled 2022-01-22 (×2): qty 1

## 2022-01-22 MED ORDER — ONDANSETRON HCL 4 MG PO TABS: 4.0000 mg | ORAL_TABLET | Freq: Four times a day (QID) | ORAL | Status: DC | PRN

## 2022-01-22 MED ORDER — SODIUM CHLORIDE 0.9 % IV SOLN: INTRAVENOUS | Status: AC

## 2022-01-22 MED ORDER — ACETAMINOPHEN 325 MG PO TABS
650.0000 mg | ORAL_TABLET | Freq: Four times a day (QID) | ORAL | Status: DC | PRN
  Administered 2022-01-22 – 2022-01-25 (×3): 650 mg via ORAL
  Filled 2022-01-22 (×3): qty 2

## 2022-01-22 MED ORDER — PRAMIPEXOLE DIHYDROCHLORIDE 0.125 MG PO TABS: 0.1250 mg | ORAL_TABLET | Freq: Every evening | ORAL | Status: DC | PRN

## 2022-01-22 MED ORDER — LOSARTAN POTASSIUM 50 MG PO TABS
100.0000 mg | ORAL_TABLET | Freq: Every day | ORAL | Status: DC
  Administered 2022-01-23 – 2022-01-26 (×4): 100 mg via ORAL
  Filled 2022-01-22 (×4): qty 2

## 2022-01-22 MED ORDER — ALBUTEROL SULFATE (2.5 MG/3ML) 0.083% IN NEBU: 2.5000 mg | INHALATION_SOLUTION | Freq: Four times a day (QID) | RESPIRATORY_TRACT | Status: DC | PRN

## 2022-01-22 MED ORDER — GUAIFENESIN ER 600 MG PO TB12: 600.0000 mg | ORAL_TABLET | Freq: Two times a day (BID) | ORAL | Status: DC | PRN

## 2022-01-22 MED ORDER — ACETAMINOPHEN 650 MG RE SUPP: 650.0000 mg | Freq: Four times a day (QID) | RECTAL | Status: DC | PRN

## 2022-01-22 NOTE — Assessment & Plan Note (Signed)
Continue home losartan and diltiazem.

## 2022-01-22 NOTE — Hospital Course (Signed)
[name] is a 88 y.o. female with medical history significant for history of PE on Eliquis, HTN, HLD, hyponatremia, lower extremity edema, OA, goiter, RLS, physical debility and wheelchair dependent who is admitted with community acquired pneumonia of the left lower lobe.

## 2022-01-22 NOTE — ED Triage Notes (Signed)
Per EMS- Patient is from [facility]. Staff reports that he patient has a Sodium level-123.

## 2022-01-22 NOTE — H&P (Signed)
History and Physical    [name] TDS:[id number] DOB: 1927-12-16 DOA: 01/22/2022  PCP: Eulas Post, MD  Patient coming from: [facility]  I have personally briefly reviewed patient's old medical records in [place]  Chief Complaint: Cough, fatigue  HPI: [name] is a 88 y.o. female with medical history significant for history of PE on Eliquis, HTN, HLD, hyponatremia, lower extremity edema, OA, goiter, RLS, physical debility and wheelchair dependent who presented to the ED from [facility] for evaluation of fatigue and cough.  History supplemented by daughter.  Patient recently moved into [facility] 1 week ago.  She has had some delirium due to change in environment.  She has chronic physical debility requiring 2 person assist for transfer and is wheelchair dependent.  She has had hyponatremia with sodium mid to high 120s over at least the last 3 months followed closely by her PCP.  Was previously on HCTZ which was discontinued.  Also deals with intermittent swelling to her lower legs and ankles.  Was on Lasix only as needed previously.  Daughter states that she was placed on daily Lasix by the ALF.  He was diagnosed with pneumonia earlier this week and started on Augmentin which she has taken for the last 3 days.  Labs at her facility showed worsening sodium from baseline at 123.  She was subsequently sent to the ED for further evaluation.  Patient has chronically slow speech and clearing of her throat which has not really changed from her baseline per daughter.  Denies any issues with dysphagia.  Has been adding salt to diet to try and help with low sodium.  Has been having some left lower chest discomfort.  No increased peripheral edema from baseline.  ED Course  Labs/Imaging on admission: I have personally reviewed following labs and imaging studies.  Initial vitals showed BP 110/54, [name] 95, RR 16, temp 97.9 F, SpO2  94% on room air.  Labs show WBC 9.0, hemoglobin 12.1, platelets 488,000, sodium 127, potassium 4.1, bicarb 25, BUN 15, creatinine 0.70, serum glucose 136, LFTs within normal limits, magnesium 1.7, lipase 51, troponin 6, lactic acid 1.9.  SARS-CoV-2 and influenza PCR negative.  Blood cultures in process.  Portable chest x-ray shows low lung volumes, subsegmental atelectasis right base, airspace disease left base with possible small left effusion.  Large hiatal hernia noted.  Patient was given IV ceftriaxone and azithromycin.  The hospitalist service was consulted to admit for further evaluation and management.  Review of Systems: All systems reviewed and are negative except as documented in history of present illness above.   Past Medical History:  Diagnosis Date   BACK PAIN, LUMBAR, CHRONIC 02/20/2010   Cardiomegaly 03/16/2010   COLONIC POLYPS 02/20/2010   DIVERTICULITIS, HX OF 02/20/2010   GERD 02/20/2010   GOITER 02/20/2010   HYPERLIPIDEMIA 02/20/2010   OSTEOARTHRITIS, MULTIPLE JOINTS 02/20/2010   URINARY INCONTINENCE 02/20/2010    Past Surgical History:  Procedure Laterality Date   ABDOMINAL HYSTERECTOMY     NOSE SURGERY  1975   head surgery to repair cracked bone over nose    SPINE SURGERY     4 back surgeries   THYROIDECTOMY, PARTIAL     ? goiter, no CA   TONSILLECTOMY  1936   TOTAL SHOULDER ARTHROPLASTY  07/30/2011   Procedure: TOTAL SHOULDER ARTHROPLASTY;  Surgeon: Augustin Schooling, MD;  Location: [facility];  Service: Orthopedics;  Laterality: Right;  Right Reversed Total Shoulder  Social History:  reports that she has never smoked. She has never used smokeless tobacco. She reports that she does not drink alcohol and does not use drugs.  No Known Allergies  Family History  Problem Relation Age of Onset   Cancer Mother        ovarian   Hyperlipidemia Mother    Diabetes Mother        type ll     Prior to Admission medications   Medication Sig Start Date End Date Taking?  Authorizing Provider  albuterol (VENTOLIN HFA) 108 (90 Base) MCG/ACT inhaler Inhale 2 puffs into the lungs every 4 (four) hours as needed for wheezing or shortness of breath. 10/13/21   Burchette, Alinda Sierras, MD  apixaban (ELIQUIS) 5 MG TABS tablet AT THE START OF THERAPY , TAKE 2 TABLETS TWICE A DAY FOR 7 DAYS AND THEN 1 TABLET TWICE A DAY THEREAFTER 11/25/21   Burchette, Alinda Sierras, MD  baclofen (LIORESAL) 10 MG tablet Take one tablet by mouth twice daily as needed for muscle spasm. 10/13/21   Burchette, Alinda Sierras, MD  diltiazem (CARDIZEM CD) 120 MG 24 hr capsule Take 1 capsule (120 mg total) by mouth daily. 11/02/21   Burchette, Alinda Sierras, MD  diphenhydramine-acetaminophen (TYLENOL PM) 25-500 MG TABS tablet Take 1 tablet by mouth at bedtime as needed. Takes 3 tablets at night.    [provider]  Docusate Calcium (STOOL SOFTENER PO) Take by mouth.    [provider]  furosemide (LASIX) 20 MG tablet Take 1 tablet once daily as needed for edema 10/30/21   Burchette, Alinda Sierras, MD  HYDROcodone bit-homatropine (HYCODAN) 5-1.5 MG/5ML syrup Take 5 mLs by mouth every 8 (eight) hours as needed for cough. 11/02/21   Burchette, Alinda Sierras, MD  ipratropium-albuterol (DUONEB) 0.5-2.5 (3) MG/3ML SOLN USE 3 ML VIA NEBULIZER EVERY 6 HOURS AS NEEDED 10/21/21   Burchette, Alinda Sierras, MD  losartan (COZAAR) 100 MG tablet Take 1 tablet (100 mg total) by mouth daily. 10/30/21   Burchette, Alinda Sierras, MD  MELATONIN PO Take 5 mg by mouth at bedtime.    [provider]  Multiple Vitamin (MULITIVITAMIN WITH MINERALS) TABS Take 1 tablet by mouth daily.    [provider]  pramipexole (MIRAPEX) 0.125 MG tablet TAKE 1 TO 2 TABLETS BY MOUTH AT NIGHT AS NEEDED FOR RESTLESS LEGS OR SYMPTOMS 01/13/22   Burchette, Alinda Sierras, MD  Spacer/Aero-Holding Chambers (AEROCHAMBER MV) inhaler Use as instructed 10/13/21   Burchette, Alinda Sierras, MD  traMADol (ULTRAM) 50 MG tablet TAKE 1 TABLET BY MOUTH EVERY 12 HOURS AS NEEDED 09/24/21    Eulas Post, MD    Physical Exam: Vitals:   01/22/22 1802 01/22/22 1802 01/22/22 1915  BP:  (!) 110/54 (!) 113/95  [name]:  95 93  Resp:  16 (!) 22  Temp:  97.9 F (36.6 C)   TempSrc:  Oral   SpO2: 94% 92% 95%   Constitutional: Elderly woman resting in bed with head elevated, NAD, calm, comfortable Eyes: EOMI, lids and conjunctivae normal ENMT: Mucous membranes are moist. Posterior pharynx clear of any exudate or lesions.Normal dentition.  Neck: Goiter Respiratory: clear to auscultation bilaterally, no wheezing, no crackles. Normal respiratory effort. No accessory muscle use.  Frequent clearing of throat. Cardiovascular: Regular rate and rhythm, no murmurs / rubs / gallops. No extremity edema. 2+ pedal pulses. Abdomen: no tenderness, no masses palpated.  Musculoskeletal: no clubbing / cyanosis. No joint deformity upper and lower extremities. Good ROM,  no contractures. Normal muscle tone.  Skin: no rashes, lesions, ulcers. No induration Neurologic: Speech is slow, chronic per family.  No dysarthria.  Sensation intact. Strength equal all extremities. Psychiatric: Alert and oriented x 3. Normal mood.   EKG: Personally reviewed. Sinus rhythm, rate 90, low voltage.  Assessment/Plan Principal Problem:   Community acquired pneumonia of left lower lobe of lung Active Problems:   Hyponatremia   Hypertension   RLS (restless legs syndrome)   History of pulmonary embolus (PE)   Physical debility   [name] is a 88 y.o. female with medical history significant for history of PE on Eliquis, HTN, HLD, hyponatremia, lower extremity edema, OA, goiter, RLS, physical debility and wheelchair dependent who is admitted with community acquired pneumonia of the left lower lobe.  Assessment and Plan: * Community acquired pneumonia of left lower lobe of lung CXR with airspace disease left lower lobe concerning for pneumonia.  Treated for the same 3 months ago.  COVID and influenza  negative. -Continue ceftriaxone and azithromycin -Supplemental oxygen as needed -Follow blood cultures, strep pneumonia urinary antigen -Can potentially transition to oral antibiotics and discharge to ALF next 24-48 hours  Hyponatremia Ongoing issue for at least the last 3 months.  Followed closely by her PCP.  Taken off HCTZ.  Sodium 127 on admission, not far off from recent baseline.  Appears euvolemic to mildly hypovolemic. -Check urine sodium, urine and serum osmolality -Start IV NS'@75'$  mL/hour x 10 hours -Hold Lasix -Repeat labs in a.m.  History of pulmonary embolus (PE) Continue Eliquis.  RLS (restless legs syndrome) Continue pramipexole.  Hypertension Continue home losartan and diltiazem.  DVT prophylaxis: Eliquis Code Status: DNR, confirmed on admission Family Communication: Daughter at bedside Disposition Plan: From ALF and likely discharge to same facility pending clinical progress Consults called: None Severity of Illness: The appropriate patient status for this patient is OBSERVATION. Observation status is judged to be reasonable and necessary in order to provide the required intensity of service to ensure the patient's safety. The patient's presenting symptoms, physical exam findings, and initial radiographic and laboratory data in the context of their medical condition is felt to place them at decreased risk for further clinical deterioration. Furthermore, it is anticipated that the patient will be medically stable for discharge from the hospital within 2 midnights of admission.   Zada Finders MD [facility]  If 7PM-7AM, please contact night-coverage [email or web address]  01/22/2022, 10:19 PM

## 2022-01-22 NOTE — ED Provider Notes (Signed)
[facility] DEPT Provider Note   CSN: [id number] Arrival date & time: 01/22/22  1752     History  Chief Complaint  Patient presents with   Abnormal Lab    [name] is a 88 y.o. female.  The history is provided by the patient and medical records. No language interpreter was used.  Abnormal Lab Time since result:  Low NA Patient referred by:  Nursing home Result type: chemistry   Chemistry:    Sodium:  Low      Home Medications Prior to Admission medications   Medication Sig Start Date End Date Taking? Authorizing Provider  albuterol (VENTOLIN HFA) 108 (90 Base) MCG/ACT inhaler Inhale 2 puffs into the lungs every 4 (four) hours as needed for wheezing or shortness of breath. 10/13/21   Burchette, Alinda Sierras, MD  apixaban (ELIQUIS) 5 MG TABS tablet AT THE START OF THERAPY , TAKE 2 TABLETS TWICE A DAY FOR 7 DAYS AND THEN 1 TABLET TWICE A DAY THEREAFTER 11/25/21   Burchette, Alinda Sierras, MD  baclofen (LIORESAL) 10 MG tablet Take one tablet by mouth twice daily as needed for muscle spasm. 10/13/21   Burchette, Alinda Sierras, MD  diltiazem (CARDIZEM CD) 120 MG 24 hr capsule Take 1 capsule (120 mg total) by mouth daily. 11/02/21   Burchette, Alinda Sierras, MD  diphenhydramine-acetaminophen (TYLENOL PM) 25-500 MG TABS tablet Take 1 tablet by mouth at bedtime as needed. Takes 3 tablets at night.    [provider]  Docusate Calcium (STOOL SOFTENER PO) Take by mouth.    [provider]  furosemide (LASIX) 20 MG tablet Take 1 tablet once daily as needed for edema 10/30/21   Burchette, Alinda Sierras, MD  HYDROcodone bit-homatropine (HYCODAN) 5-1.5 MG/5ML syrup Take 5 mLs by mouth every 8 (eight) hours as needed for cough. 11/02/21   Burchette, Alinda Sierras, MD  ipratropium-albuterol (DUONEB) 0.5-2.5 (3) MG/3ML SOLN USE 3 ML VIA NEBULIZER EVERY 6 HOURS AS NEEDED 10/21/21   Burchette, Alinda Sierras, MD  losartan (COZAAR) 100 MG tablet Take 1 tablet (100 mg total) by mouth  daily. 10/30/21   Burchette, Alinda Sierras, MD  MELATONIN PO Take 5 mg by mouth at bedtime.    [provider]  Multiple Vitamin (MULITIVITAMIN WITH MINERALS) TABS Take 1 tablet by mouth daily.    [provider]  pramipexole (MIRAPEX) 0.125 MG tablet TAKE 1 TO 2 TABLETS BY MOUTH AT NIGHT AS NEEDED FOR RESTLESS LEGS OR SYMPTOMS 01/13/22   Burchette, Alinda Sierras, MD  Spacer/Aero-Holding Chambers (AEROCHAMBER MV) inhaler Use as instructed 10/13/21   Burchette, Alinda Sierras, MD  traMADol (ULTRAM) 50 MG tablet TAKE 1 TABLET BY MOUTH EVERY 12 HOURS AS NEEDED 09/24/21   Burchette, Alinda Sierras, MD      Allergies    Patient has no known allergies.    Review of Systems   Review of Systems  Constitutional:  Positive for fatigue. Negative for chills and fever.  HENT:  Positive for congestion.   Respiratory:  Positive for cough. Negative for chest tightness, shortness of breath and wheezing.   Cardiovascular:  Negative for chest pain.  Gastrointestinal:  Negative for abdominal pain, diarrhea, nausea and vomiting.  Genitourinary:  Negative for dysuria and flank pain.  Musculoskeletal:  Negative for back pain, neck pain and neck stiffness.  Skin:  Negative for rash and wound.  Neurological:  Negative for weakness, light-headedness and headaches.  Psychiatric/Behavioral:  Positive for confusion. Negative for agitation.   All  other systems reviewed and are negative.   Physical Exam Updated Vital Signs BP (!) 110/54   Pulse 95   Temp 97.9 F (36.6 C) (Oral)   Resp 16   SpO2 92%  Physical Exam Vitals and nursing note reviewed.  Constitutional:      General: She is not in acute distress.    Appearance: She is well-developed. She is not ill-appearing, toxic-appearing or diaphoretic.  HENT:     Head: Normocephalic and atraumatic.     Nose: Congestion present.     Mouth/Throat:     Mouth: Mucous membranes are moist.     Pharynx: No oropharyngeal exudate or posterior oropharyngeal erythema.  Eyes:      Extraocular Movements: Extraocular movements intact.     Conjunctiva/sclera: Conjunctivae normal.     Pupils: Pupils are equal, round, and reactive to light.  Cardiovascular:     Rate and Rhythm: Normal rate and regular rhythm.     Heart sounds: No murmur heard. Pulmonary:     Effort: Pulmonary effort is normal. No respiratory distress.     Breath sounds: Rhonchi and rales present. No wheezing.  Chest:     Chest wall: No tenderness.  Abdominal:     General: Abdomen is flat.     Palpations: Abdomen is soft.     Tenderness: There is no abdominal tenderness. There is no right CVA tenderness, left CVA tenderness, guarding or rebound.  Musculoskeletal:        General: No swelling or tenderness.     Cervical back: Neck supple. No tenderness.     Right lower leg: Edema present.     Left lower leg: Edema present.  Skin:    General: Skin is warm and dry.     Capillary Refill: Capillary refill takes less than 2 seconds.     Coloration: Skin is pale.     Findings: No erythema or rash.  Neurological:     General: No focal deficit present.     Mental Status: She is alert.     Sensory: No sensory deficit.     Motor: No weakness.  Psychiatric:        Mood and Affect: Mood normal.     ED Results / Procedures / Treatments   Labs (all labs ordered are listed, but only abnormal results are displayed) Labs Reviewed  CBC WITH DIFFERENTIAL/PLATELET - Abnormal; Notable for the following components:      Result Value   Platelets 488 (*)    Monocytes Absolute 1.3 (*)    All other components within normal limits  COMPREHENSIVE METABOLIC PANEL - Abnormal; Notable for the following components:   Sodium 127 (*)    Chloride 92 (*)    Glucose, Bld 136 (*)    Albumin 3.3 (*)    All other components within normal limits  RESP PANEL BY RT-PCR (FLU A&B, COVID) ARPGX2  CULTURE, BLOOD (ROUTINE X 2)  CULTURE, BLOOD (ROUTINE X 2)  URINE CULTURE  LIPASE, BLOOD  LACTIC ACID, PLASMA  AMMONIA   MAGNESIUM  LACTIC ACID, PLASMA  BRAIN NATRIURETIC PEPTIDE  URINALYSIS, ROUTINE W REFLEX MICROSCOPIC  TSH  TROPONIN I (HIGH SENSITIVITY)    EKG EKG Interpretation  Date/Time:  Friday January 22 2022 19:06:41 EST Ventricular Rate:  90 PR Interval:  179 QRS Duration: 75 QT Interval:  359 QTC Calculation: 440 R Axis:   7 Text Interpretation: Sinus rhythm Low voltage, precordial leads when compared to prior, similar appearance. No STEMI Confirmed by Romilda Proby,  [name] [phone]) on 01/22/2022 7:11:54 PM  Radiology DG Chest Portable 1 View  Result Date: 01/22/2022 CLINICAL DATA:  Cough altered EXAM: PORTABLE CHEST 1 VIEW COMPARISON:  10/21/2021, CT 11/15/2018 FINDINGS: Hypoventilatory changes with probable subsegmental atelectasis at the bases. Mild airspace disease at left base with possible small effusion. Large hiatal hernia. Bilateral shoulder replacements. Aortic atherosclerosis IMPRESSION: Low lung volumes. Subsegmental atelectasis right base. Airspace disease at left base could reflect atelectasis or pneumonia. Possible small left effusion. Large hiatal hernia Electronically Signed   By: Donavan Foil M.D.   On: 01/22/2022 19:47    Procedures Procedures    CRITICAL CARE Performed by: Gwenyth Allegra Quaniya Damas Total critical care time: 35 minutes Critical care time was exclusive of separately billable procedures and treating other patients. Critical care was necessary to treat or prevent imminent or life-threatening deterioration. Critical care was time spent personally by me on the following activities: development of treatment plan with patient and/or surrogate as well as nursing, discussions with consultants, evaluation of patient's response to treatment, examination of patient, obtaining history from patient or surrogate, ordering and performing treatments and interventions, ordering and review of laboratory studies, ordering and review of radiographic studies, pulse oximetry and  re-evaluation of patient's condition.  Medications Ordered in ED Medications  cefTRIAXone (ROCEPHIN) 1 g in sodium chloride 0.9 % 100 mL IVPB (has no administration in time range)  azithromycin (ZITHROMAX) 500 mg in sodium chloride 0.9 % 250 mL IVPB (has no administration in time range)    ED Course/ Medical Decision Making/ A&P                           Medical Decision Making Amount and/or Complexity of Data Reviewed Labs: ordered. Radiology: ordered.  Risk Decision regarding hospitalization.    FANTASY DONALD is a 88 y.o. female with a past medical history significant for previous pulmonary embolism on Eliquis therapy, previous diverticulitis, GERD, diuretic use in the setting of fluid overload, and family report of recent pneumonia who presents with altered mental status, fatigue, decreased oral intake, cough, and abnormal lab value.  According to family, patient recently moved into an assisted living facility a week ago and since then has not been eating or drinking as much.  She has had hyponatremia in the past that was thought to be possibly related to diuretics.  Patient does have edema and she has been taking diuretics.  Family said the patient has been acting more fatigued and acting slightly abnormal although she is not having any focal neurologic deficits.  Patient has no recent head injury and is not having any numbness or weakness focally.  No speech difficulty or vision changes.  Patient has been complaining of some cough but denies chest pain or shortness of breath.  She was told that she might have pneumonia but is unclear.  Family could be concerned she might have a UTI given the altered mental status as well.  She reportedly had some labs and her sodium was found to be 123 and they sent her here for assessment and evaluation and possible admission if this is the cause of her altered mental status.  She reports that chronically she has had some pain in her left upper  abdomen/left lower chest wall under her breast that does not seem different.  On exam, lungs were very coarse in the bases and right side was quite abnormal sounding more diminished.  Chest was nontender.  Abdomen was  nontender.  Patient was complaining of chronic pain in her left upper abdomen/left lower chest but there was no reported rash.  Bowel sounds were appreciated.  Abdomen is otherwise nontender.  Back and flanks nontender.  She has edema in her legs but otherwise had no focal neurologic deficits initially.  We will check labs to look for significant lecture light abnormalities and will also get urinalysis and chest x-ray to look for infectious etiologies of her altered mental status.  Given the family report that she is not eating or drinking and is not acting like her normal self, if her electrolytes are abnormal she may need admission for rehydration and monitoring.  As there is no focal deficit, she did not hit her head, will hold on head imaging at this time and family agrees with this.  Anticipate reassessment after workup.  8:07 PM Workup continues to return.  Patient's x-ray does show evidence of likely pneumonia in the left lower lung.  Shows atelectasis in the right base and effusion.  Sodium is 127 so it is improved from reportedly earlier.  She does not have leukocytosis and her lipase is normal.  Magnesium normal.  Troponin normal.  Ammonia normal.  She will get her COVID and flu swab and other labs however due to the pneumonia, altered mental status, I do feel she is admission.  A port score was calculated and found to be a 142 which puts her at a risk last 5 with a 27.0-29.2% mortality recommending hospitalization.  Will order antibiotics and call for admission for pneumonia.  I spoke to family again and was able to find paperwork showing that she was started on Augmentin 2 days ago for what they also suspected was pneumonia.        Final Clinical Impression(s) / ED  Diagnoses Final diagnoses:  Community acquired pneumonia of left lower lobe of lung  Tachypnea  Altered mental status, unspecified altered mental status type  Hyponatremia   \ Clinical Impression: 1. Community acquired pneumonia of left lower lobe of lung   2. Tachypnea   3. Altered mental status, unspecified altered mental status type   4. Hyponatremia     Disposition: Admit  This note was prepared with assistance of Dragon voice recognition software. Occasional wrong-word or sound-a-like substitutions may have occurred due to the inherent limitations of voice recognition software.      Jamoni Broadfoot, Gwenyth Allegra, MD 01/22/22 [phone]

## 2022-01-22 NOTE — Assessment & Plan Note (Signed)
Continue pramipexole.

## 2022-01-22 NOTE — Assessment & Plan Note (Signed)
Ongoing issue for at least the last 3 months.  Followed closely by her PCP.  Taken off HCTZ.  Sodium 127 on admission, not far off from recent baseline.  Appears euvolemic to mildly hypovolemic. -Check urine sodium, urine and serum osmolality -Start IV NS'@75'$  mL/hour x 10 hours -Hold Lasix -Repeat labs in a.m.

## 2022-01-22 NOTE — Assessment & Plan Note (Signed)
CXR with airspace disease left lower lobe concerning for pneumonia.  Treated for the same 3 months ago.  COVID and influenza negative. -Continue ceftriaxone and azithromycin -Supplemental oxygen as needed -Follow blood cultures, strep pneumonia urinary antigen -Can potentially transition to oral antibiotics and discharge to ALF next 24-48 hours

## 2022-01-22 NOTE — Assessment & Plan Note (Signed)
-   Continue Eliquis 

## 2022-01-23 ENCOUNTER — Observation Stay (HOSPITAL_COMMUNITY): Payer: Medicare Other

## 2022-01-23 DIAGNOSIS — I700 Atherosclerosis of aorta: Secondary | ICD-10-CM | POA: Diagnosis not present

## 2022-01-23 DIAGNOSIS — Z86010 Personal history of colonic polyps: Secondary | ICD-10-CM | POA: Diagnosis not present

## 2022-01-23 DIAGNOSIS — S2242XA Multiple fractures of ribs, left side, initial encounter for closed fracture: Secondary | ICD-10-CM | POA: Diagnosis present

## 2022-01-23 DIAGNOSIS — Z833 Family history of diabetes mellitus: Secondary | ICD-10-CM | POA: Diagnosis not present

## 2022-01-23 DIAGNOSIS — Z7409 Other reduced mobility: Secondary | ICD-10-CM | POA: Diagnosis not present

## 2022-01-23 DIAGNOSIS — R54 Age-related physical debility: Secondary | ICD-10-CM | POA: Diagnosis present

## 2022-01-23 DIAGNOSIS — Z83438 Family history of other disorder of lipoprotein metabolism and other lipidemia: Secondary | ICD-10-CM | POA: Diagnosis not present

## 2022-01-23 DIAGNOSIS — Z66 Do not resuscitate: Secondary | ICD-10-CM | POA: Diagnosis present

## 2022-01-23 DIAGNOSIS — R1312 Dysphagia, oropharyngeal phase: Secondary | ICD-10-CM | POA: Diagnosis not present

## 2022-01-23 DIAGNOSIS — N390 Urinary tract infection, site not specified: Secondary | ICD-10-CM | POA: Diagnosis not present

## 2022-01-23 DIAGNOSIS — Z741 Need for assistance with personal care: Secondary | ICD-10-CM | POA: Diagnosis not present

## 2022-01-23 DIAGNOSIS — Z86711 Personal history of pulmonary embolism: Secondary | ICD-10-CM | POA: Diagnosis not present

## 2022-01-23 DIAGNOSIS — Z7901 Long term (current) use of anticoagulants: Secondary | ICD-10-CM | POA: Diagnosis not present

## 2022-01-23 DIAGNOSIS — I10 Essential (primary) hypertension: Secondary | ICD-10-CM | POA: Diagnosis present

## 2022-01-23 DIAGNOSIS — Z7401 Bed confinement status: Secondary | ICD-10-CM | POA: Diagnosis not present

## 2022-01-23 DIAGNOSIS — Z8719 Personal history of other diseases of the digestive system: Secondary | ICD-10-CM | POA: Diagnosis not present

## 2022-01-23 DIAGNOSIS — E861 Hypovolemia: Secondary | ICD-10-CM | POA: Diagnosis present

## 2022-01-23 DIAGNOSIS — G2581 Restless legs syndrome: Secondary | ICD-10-CM | POA: Diagnosis present

## 2022-01-23 DIAGNOSIS — R4182 Altered mental status, unspecified: Secondary | ICD-10-CM | POA: Diagnosis not present

## 2022-01-23 DIAGNOSIS — J9811 Atelectasis: Secondary | ICD-10-CM | POA: Diagnosis present

## 2022-01-23 DIAGNOSIS — Z20822 Contact with and (suspected) exposure to covid-19: Secondary | ICD-10-CM | POA: Diagnosis present

## 2022-01-23 DIAGNOSIS — R0682 Tachypnea, not elsewhere classified: Secondary | ICD-10-CM | POA: Diagnosis present

## 2022-01-23 DIAGNOSIS — E871 Hypo-osmolality and hyponatremia: Secondary | ICD-10-CM | POA: Diagnosis present

## 2022-01-23 DIAGNOSIS — Z79899 Other long term (current) drug therapy: Secondary | ICD-10-CM | POA: Diagnosis not present

## 2022-01-23 DIAGNOSIS — Z8041 Family history of malignant neoplasm of ovary: Secondary | ICD-10-CM | POA: Diagnosis not present

## 2022-01-23 DIAGNOSIS — E785 Hyperlipidemia, unspecified: Secondary | ICD-10-CM | POA: Diagnosis present

## 2022-01-23 DIAGNOSIS — K449 Diaphragmatic hernia without obstruction or gangrene: Secondary | ICD-10-CM | POA: Diagnosis present

## 2022-01-23 DIAGNOSIS — X58XXXA Exposure to other specified factors, initial encounter: Secondary | ICD-10-CM | POA: Diagnosis present

## 2022-01-23 DIAGNOSIS — Z993 Dependence on wheelchair: Secondary | ICD-10-CM | POA: Diagnosis not present

## 2022-01-23 DIAGNOSIS — J189 Pneumonia, unspecified organism: Secondary | ICD-10-CM | POA: Diagnosis present

## 2022-01-23 DIAGNOSIS — R404 Transient alteration of awareness: Secondary | ICD-10-CM | POA: Diagnosis not present

## 2022-01-23 DIAGNOSIS — Z90710 Acquired absence of both cervix and uterus: Secondary | ICD-10-CM | POA: Diagnosis not present

## 2022-01-23 DIAGNOSIS — E049 Nontoxic goiter, unspecified: Secondary | ICD-10-CM | POA: Diagnosis present

## 2022-01-23 DIAGNOSIS — R41841 Cognitive communication deficit: Secondary | ICD-10-CM | POA: Diagnosis not present

## 2022-01-23 DIAGNOSIS — M159 Polyosteoarthritis, unspecified: Secondary | ICD-10-CM | POA: Diagnosis present

## 2022-01-23 LAB — URINALYSIS, ROUTINE W REFLEX MICROSCOPIC
Bacteria, UA: NONE SEEN
Bilirubin Urine: NEGATIVE
Glucose, UA: NEGATIVE mg/dL
Hgb urine dipstick: NEGATIVE
Ketones, ur: NEGATIVE mg/dL
Nitrite: NEGATIVE
Protein, ur: NEGATIVE mg/dL
Specific Gravity, Urine: 1.015 (ref 1.005–1.030)
pH: 5 (ref 5.0–8.0)

## 2022-01-23 LAB — CBC
HCT: 30.3 % — ABNORMAL LOW (ref 36.0–46.0)
Hemoglobin: 10.1 g/dL — ABNORMAL LOW (ref 12.0–15.0)
MCH: 28.9 pg (ref 26.0–34.0)
MCHC: 33.3 g/dL (ref 30.0–36.0)
MCV: 86.6 fL (ref 80.0–100.0)
Platelets: 379 10*3/uL (ref 150–400)
RBC: 3.5 MIL/uL — ABNORMAL LOW (ref 3.87–5.11)
RDW: 13.7 % (ref 11.5–15.5)
WBC: 5.8 10*3/uL (ref 4.0–10.5)
nRBC: 0 % (ref 0.0–0.2)

## 2022-01-23 LAB — OSMOLALITY, URINE: Osmolality, Ur: 455 mOsm/kg (ref 300–900)

## 2022-01-23 LAB — SODIUM, URINE, RANDOM: Sodium, Ur: 17 mmol/L

## 2022-01-23 LAB — BASIC METABOLIC PANEL
Anion gap: 8 (ref 5–15)
BUN: 14 mg/dL (ref 8–23)
CO2: 24 mmol/L (ref 22–32)
Calcium: 8.2 mg/dL — ABNORMAL LOW (ref 8.9–10.3)
Chloride: 96 mmol/L — ABNORMAL LOW (ref 98–111)
Creatinine, Ser: 0.65 mg/dL (ref 0.44–1.00)
GFR, Estimated: >60 mL/min (ref >60)
Glucose, Bld: 97 mg/dL (ref 70–99)
Potassium: 4 mmol/L (ref 3.5–5.1)
Sodium: 128 mmol/L — ABNORMAL LOW (ref 135–145)

## 2022-01-23 LAB — STREP PNEUMONIAE URINARY ANTIGEN: Strep Pneumo Urinary Antigen: NEGATIVE

## 2022-01-23 LAB — OSMOLALITY: Osmolality: 264 mOsm/kg — ABNORMAL LOW (ref 275–295)

## 2022-01-23 LAB — TSH: TSH: 2.137 u[IU]/mL (ref 0.350–4.500)

## 2022-01-23 MED ORDER — SENNOSIDES-DOCUSATE SODIUM 8.6-50 MG PO TABS
2.0000 | ORAL_TABLET | Freq: Every day | ORAL | Status: DC
  Administered 2022-01-23 – 2022-01-24 (×2): 2 via ORAL
  Filled 2022-01-23 (×3): qty 2

## 2022-01-23 MED ORDER — AZITHROMYCIN 250 MG PO TABS
500.0000 mg | ORAL_TABLET | Freq: Every day | ORAL | Status: DC
  Administered 2022-01-23 – 2022-01-25 (×3): 500 mg via ORAL
  Filled 2022-01-23 (×3): qty 2

## 2022-01-23 NOTE — Plan of Care (Signed)
Patient AOX4, delayed response.  VSS throughout shift.  Skin redness scattered, Q2H turns.  Pt's daughter remains at bedside.  Bed alarm activated.  Diminished lungs, IS taught and encouraged.  Purewick in place.  BLE edema.  Admissions documentation done.  POC maintained, will continue to monitor.  Problem: Activity: Goal: Ability to tolerate increased activity will improve Outcome: Progressing   Problem: Clinical Measurements: Goal: Ability to maintain a body temperature in the normal range will improve Outcome: Progressing   Problem: Respiratory: Goal: Ability to maintain adequate ventilation will improve Outcome: Progressing Goal: Ability to maintain a clear airway will improve Outcome: Progressing   Problem: Education: Goal: Knowledge of General Education information will improve Description: Including pain rating scale, medication(s)/side effects and non-pharmacologic comfort measures Outcome: Progressing   Problem: Health Behavior/Discharge Planning: Goal: Ability to manage health-related needs will improve Outcome: Progressing   Problem: Clinical Measurements: Goal: Ability to maintain clinical measurements within normal limits will improve Outcome: Progressing Goal: Will remain free from infection Outcome: Progressing Goal: Diagnostic test results will improve Outcome: Progressing Goal: Respiratory complications will improve Outcome: Progressing Goal: Cardiovascular complication will be avoided Outcome: Progressing   Problem: Activity: Goal: Risk for activity intolerance will decrease Outcome: Progressing   Problem: Nutrition: Goal: Adequate nutrition will be maintained Outcome: Progressing   Problem: Coping: Goal: Level of anxiety will decrease Outcome: Progressing   Problem: Elimination: Goal: Will not experience complications related to bowel motility Outcome: Progressing Goal: Will not experience complications related to urinary retention Outcome:  Progressing   Problem: Pain Managment: Goal: General experience of comfort will improve Outcome: Progressing   Problem: Safety: Goal: Ability to remain free from injury will improve Outcome: Progressing   Problem: Skin Integrity: Goal: Risk for impaired skin integrity will decrease Outcome: Progressing

## 2022-01-23 NOTE — Progress Notes (Addendum)
PROGRESS NOTE  [name] ZJQ:[id number] DOB: 01/08/1928 DOA: 01/22/2022 PCP: Eulas Post, MD  HPI/Recap of past 24 hours: [name] is a 88 y.o. female with medical history significant for prior PE on Eliquis, HTN, HLD, hyponatremia, lower extremity edema, OA, goiter, RLS, physical debility and wheelchair dependent who presented to the ED from [facility] for evaluation of fatigue and cough.  Recently moved into [facility] 1 week ago.  She has had some delirium due to change in environment.  She has chronic physical debility requiring 2 person assist for transfer and is wheelchair dependent.   She has had hyponatremia with sodium mid to high 120s over at least the last 3 months followed closely by her PCP.  Was previously on HCTZ which was discontinued.  Labs at her facility showed worsening sodium from baseline at 123.  She was subsequently sent to the ED for further evaluation.   Patient has chronically slow speech and clearing of her throat which has not really changed from her baseline per daughter.  Denies any issues with dysphagia.  Has been adding salt to diet to try and help with low sodium.  Has been having some left lower chest discomfort.  No increased peripheral edema from baseline.  CT chest on 01/23/2022 showed nondisplaced fractures of the left anterior fifth and sixth ribs, appears to be recent.  Pneumonia cannot be ruled out.  The patient is on Rocephin and azithromycin.  Sodium is uptrending, 128.  01/23/2022: The patient was seen and examined at bedside with her daughter present in the room.  She is alert and minimally interactive.  She has no new complaints.  Assessment/Plan: Principal Problem:   Community acquired pneumonia of left lower lobe of lung Active Problems:   Hyponatremia   Hypertension   RLS (restless legs syndrome)   History of pulmonary embolus (PE)   Physical debility   CAP (community acquired  pneumonia)  Community acquired pneumonia of left lower lobe of lung CXR with airspace disease left lower lobe concerning for pneumonia.  Treated for the same 3 months ago.  COVID and influenza negative. Continue ceftriaxone and azithromycin Currently on room air. -Follow blood cultures, strep pneumonia urinary antigen   Acute on chronic hyponatremia Ongoing issue for at least the last 3 months.  Followed closely by her PCP.  Taken off HCTZ.  Sodium 127 on admission, not far off from recent baseline.   Serum sodium uptrending 128.  She received IV fluid normal saline at 75 cc/h x 10 hours.  Encourage increase oral   History of pulmonary embolus (PE) Continue home Eliquis.   RLS (restless legs syndrome) Continue home pramipexole.   Hypertension Continue home losartan and diltiazem. Closely monitor vital signs  Generalized weakness PT OT assessment Uses 2 persons assist, uses wheelchair Fall precautions   DVT prophylaxis: Eliquis Code Status: DNR, confirmed on admission Family Communication: Updated her daughter at bedside Disposition Plan: From ALF and likely discharge to same facility pending clinical progress Consults called: None  Status is: Inpatient The patient requires at least 2 midnights for further evaluation and treatment of present condition.    Objective: Vitals:   01/22/22 2225 01/22/22 2334 01/23/22 0400 01/23/22 1316  BP:  (!) 122/56 125/60 (!) 127/54  Pulse:  96 96 88  Resp:  '17 17 17  '$ Temp: 97.6 F (36.4 C) 97.8 F (36.6 C) (!) 97.4 F (36.3 C) 97.9 F (36.6 C)  TempSrc: Oral Oral Oral Oral  SpO2:  95% 97% 94%    Intake/Output Summary (Last 24 hours) at 01/23/2022 1626 Last data filed at 01/23/2022 1500 Gross per 24 hour  Intake 80 ml  Output 250 ml  Net -170 ml   There were no vitals filed for this visit.  Exam:  General: 88 y.o. year-old female well developed well nourished in no acute distress.  Alert and minimally  interactive. Cardiovascular: Regular rate and rhythm with no rubs or gallops.  No thyromegaly or JVD noted.   Respiratory: Clear to auscultation with no wheezes or rales. Good inspiratory effort. Abdomen: Soft nontender nondistended with normal bowel sounds x4 quadrants. Musculoskeletal: Trace lower extremity edema bilaterally Skin: No ulcerative lesions noted or rashes, Psychiatry: Mood is appropriate for condition and setting   Data Reviewed: CBC: Recent Labs  Lab 01/22/22 1859 01/23/22 0641  WBC 9.0 5.8  NEUTROABS 5.5  --   HGB 12.1 10.1*  HCT 36.6 30.3*  MCV 86.1 86.6  PLT 488* 756   Basic Metabolic Panel: Recent Labs  Lab 01/22/22 1859 01/23/22 0641  NA 127* 128*  K 4.1 4.0  CL 92* 96*  CO2 25 24  GLUCOSE 136* 97  BUN 15 14  CREATININE 0.70 0.65  CALCIUM 8.9 8.2*  MG 1.7  --    GFR: CrCl cannot be calculated (Unknown ideal weight.). Liver Function Tests: Recent Labs  Lab 01/22/22 1859  AST 36  ALT 24  ALKPHOS 57  BILITOT 0.5  PROT 6.9  ALBUMIN 3.3*   Recent Labs  Lab 01/22/22 1859  LIPASE 51   Recent Labs  Lab 01/22/22 1854  AMMONIA 21   Coagulation Profile: No results for input(s): "INR", "PROTIME" in the last 168 hours. Cardiac Enzymes: No results for input(s): "CKTOTAL", "CKMB", "CKMBINDEX", "TROPONINI" in the last 168 hours. BNP (last 3 results) No results for input(s): "PROBNP" in the last 8760 hours. HbA1C: No results for input(s): "HGBA1C" in the last 72 hours. CBG: No results for input(s): "GLUCAP" in the last 168 hours. Lipid Profile: No results for input(s): "CHOL", "HDL", "LDLCALC", "TRIG", "CHOLHDL", "LDLDIRECT" in the last 72 hours. Thyroid Function Tests: Recent Labs    01/23/22 0641  TSH 2.137   Anemia Panel: No results for input(s): "VITAMINB12", "FOLATE", "FERRITIN", "TIBC", "IRON", "RETICCTPCT" in the last 72 hours. Urine analysis:    Component Value Date/Time   COLORURINE YELLOW 01/23/2022 1100   APPEARANCEUR  CLEAR 01/23/2022 1100   LABSPEC 1.015 01/23/2022 1100   PHURINE 5.0 01/23/2022 1100   GLUCOSEU NEGATIVE 01/23/2022 1100   HGBUR NEGATIVE 01/23/2022 1100   BILIRUBINUR NEGATIVE 01/23/2022 1100   KETONESUR NEGATIVE 01/23/2022 1100   PROTEINUR NEGATIVE 01/23/2022 1100   NITRITE NEGATIVE 01/23/2022 1100   LEUKOCYTESUR SMALL (A) 01/23/2022 1100   Sepsis Labs: '@LABRCNTIP'$ (procalcitonin:4,lacticidven:4)  ) Recent Results (from the past 240 hour(s))  Blood culture (routine x 2)     Status: None (Preliminary result)   Collection Time: 01/22/22  6:54 PM   Specimen: BLOOD  Result Value Ref Range Status   Specimen Description   Final    BLOOD LEFT ANTECUBITAL Performed at [facility], [place] [address] [place], [place] [address]    Special Requests   Final    BOTTLES DRAWN AEROBIC AND ANAEROBIC Blood Culture adequate volume Performed at [facility] [address], [place], [place] [address]    Culture   Final    NO GROWTH < 12 HOURS Performed at [facility]  [address] [place], [place] [address]    Report Status PENDING  Incomplete  Resp Panel by RT-PCR (Flu A&B, Covid) Anterior Nasal Swab     Status: None   Collection Time: 01/22/22  6:54 PM   Specimen: Anterior Nasal Swab  Result Value Ref Range Status   SARS Coronavirus 2 by RT PCR NEGATIVE NEGATIVE Final    Comment: (NOTE) SARS-CoV-2 target nucleic acids are NOT DETECTED.  The SARS-CoV-2 RNA is generally detectable in upper respiratory specimens during the acute phase of infection. The lowest concentration of SARS-CoV-2 viral copies this assay can detect is 138 copies/mL. A negative result does not preclude SARS-Cov-2 infection and should not be used as the sole basis for treatment or other patient management decisions. A negative result may occur with  improper specimen collection/handling, submission of specimen other than nasopharyngeal swab, presence of viral mutation(s)  within the areas targeted by this assay, and inadequate number of viral copies(<138 copies/mL). A negative result must be combined with clinical observations, patient history, and epidemiological information. The expected result is Negative.  Fact Sheet for Patients:  EntrepreneurPulse.com.au  Fact Sheet for Healthcare Providers:  IncredibleEmployment.be  This test is no t yet approved or cleared by the [place] FDA and  has been authorized for detection and/or diagnosis of SARS-CoV-2 by FDA under an Emergency Use Authorization (EUA). This EUA will remain  in effect (meaning this test can be used) for the duration of the COVID-19 declaration under Section 564(b)(1) of the Act, 21 U.S.C.section 360bbb-3(b)(1), unless the authorization is terminated  or revoked sooner.       Influenza A by PCR NEGATIVE NEGATIVE Final   Influenza B by PCR NEGATIVE NEGATIVE Final    Comment: (NOTE) The Xpert Xpress SARS-CoV-2/FLU/RSV plus assay is intended as an aid in the diagnosis of influenza from Nasopharyngeal swab specimens and should not be used as a sole basis for treatment. Nasal washings and aspirates are unacceptable for Xpert Xpress SARS-CoV-2/FLU/RSV testing.  Fact Sheet for Patients: EntrepreneurPulse.com.au  Fact Sheet for Healthcare Providers: IncredibleEmployment.be  This test is not yet approved or cleared by the [place] FDA and has been authorized for detection and/or diagnosis of SARS-CoV-2 by FDA under an Emergency Use Authorization (EUA). This EUA will remain in effect (meaning this test can be used) for the duration of the COVID-19 declaration under Section 564(b)(1) of the Act, 21 U.S.C. section 360bbb-3(b)(1), unless the authorization is terminated or revoked.  Performed at [facility], [place] [address], [place], [place] [address]   Blood culture (routine x 2)      Status: None (Preliminary result)   Collection Time: 01/23/22  6:41 AM   Specimen: BLOOD RIGHT HAND  Result Value Ref Range Status   Specimen Description   Final    BLOOD RIGHT HAND Performed at [facility], [place] [address], [place], [place] [address]    Special Requests   Final    BOTTLES DRAWN AEROBIC ONLY Blood Culture adequate volume Performed at [facility] [address] [place], [place] [address]    Culture PENDING  Incomplete   Report Status PENDING  Incomplete      Studies: CT CHEST WO CONTRAST  Result Date: 01/23/2022 CLINICAL DATA:  Cough, chronic/persistent greater than 8 weeks. EXAM: CT CHEST WITHOUT CONTRAST TECHNIQUE: Multidetector CT imaging of the chest was performed following the standard protocol without IV contrast. RADIATION DOSE REDUCTION: This exam was performed according to the departmental dose-optimization program which includes  automated exposure control, adjustment of the mA and/or kV according to patient size and/or use of iterative reconstruction technique. COMPARISON:  Chest radiography yesterday.  Chest CT 11/15/2018 FINDINGS: Cardiovascular: Heart size upper limits of normal. Coronary artery calcification and/or stents. Aortic atherosclerotic calcification. Mediastinum/Nodes: No evidence of hilar or mediastinal mass or lymphadenopathy. Lungs/Pleura: On the right, there is chronic pulmonary scarring with slightly more atelectasis in the right upper and right lower lobe. On the left, there is chronic elevation of the left hemidiaphragm with chronic volume loss at the left lung base, compared overly more pronounced in the left lower lobe which could go along with atelectasis or atelectatic pneumonia. No pleural fluid. No pneumothorax. Upper Abdomen: Negative Musculoskeletal: There are nondisplaced fractures of the left anterior fifth and sixth ribs, favored to be recent. No acute spinal finding. IMPRESSION: 1. Nondisplaced fractures of the  left anterior fifth and sixth ribs, favored to be recent. 2. Chronic elevation of the left hemidiaphragm with chronic volume loss at the left lung base. Compared to the prior study, this is more pronounced in the left lower lobe which could go along with active atelectasis or atelectatic pneumonia. 3. Chronic pulmonary scarring on the right with slightly more atelectasis in the right upper and right lower lobe. 4. Aortic atherosclerosis. Aortic Atherosclerosis (ICD10-I70.0). Electronically Signed   By: Nelson Chimes M.D.   On: 01/23/2022 12:51   DG Chest Portable 1 View  Result Date: 01/22/2022 CLINICAL DATA:  Cough altered EXAM: PORTABLE CHEST 1 VIEW COMPARISON:  10/21/2021, CT 11/15/2018 FINDINGS: Hypoventilatory changes with probable subsegmental atelectasis at the bases. Mild airspace disease at left base with possible small effusion. Large hiatal hernia. Bilateral shoulder replacements. Aortic atherosclerosis IMPRESSION: Low lung volumes. Subsegmental atelectasis right base. Airspace disease at left base could reflect atelectasis or pneumonia. Possible small left effusion. Large hiatal hernia Electronically Signed   By: Donavan Foil M.D.   On: 01/22/2022 19:47    Scheduled Meds:  apixaban  5 mg Oral BID   azithromycin  500 mg Oral QHS   baclofen  10 mg Oral QHS   diltiazem  120 mg Oral QHS   guaiFENesin  600 mg Oral BID   losartan  100 mg Oral Daily   melatonin  5 mg Oral QHS    Continuous Infusions:  cefTRIAXone (ROCEPHIN)  IV       LOS: 0 days     Kayleen Memos, MD [facility] Pager [phone]  If 7PM-7AM, please contact night-coverage [email or web address] Password [facility] 01/23/2022, 4:26 PM

## 2022-01-23 NOTE — Progress Notes (Signed)
PHARMACIST - PHYSICIAN COMMUNICATION  CONCERNING: Antibiotic IV to Oral Route Change Policy  RECOMMENDATION: This patient is receiving azithromycin by the intravenous route.  Based on criteria approved by the Pharmacy and Therapeutics Committee, the antibiotic(s) is/are being converted to the equivalent oral dose form(s).   DESCRIPTION: These criteria include:  Patient being treated for a respiratory tract infection, urinary tract infection, cellulitis or clostridium difficile associated diarrhea if on metronidazole  The patient is not neutropenic and does not exhibit a GI malabsorption state  The patient is eating (either orally or via tube) and/or has been taking other orally administered medications for a least 24 hours  The patient is improving clinically and has a Tmax < 100.5  If you have questions about this conversion, please contact the Pharmacy Department  []  ( [phone] )  [place] []  ( [phone]  [facility] []  ( [phone]  [place] []  ( [phone]  [facility] [x]  ( [phone] )   [facility]  

## 2022-01-24 DIAGNOSIS — J189 Pneumonia, unspecified organism: Secondary | ICD-10-CM | POA: Diagnosis not present

## 2022-01-24 LAB — BASIC METABOLIC PANEL
Anion gap: 8 (ref 5–15)
BUN: 9 mg/dL (ref 8–23)
CO2: 25 mmol/L (ref 22–32)
Calcium: 8.4 mg/dL — ABNORMAL LOW (ref 8.9–10.3)
Chloride: 98 mmol/L (ref 98–111)
Creatinine, Ser: 0.57 mg/dL (ref 0.44–1.00)
GFR, Estimated: >60 mL/min (ref >60)
Glucose, Bld: 115 mg/dL — ABNORMAL HIGH (ref 70–99)
Potassium: 3.9 mmol/L (ref 3.5–5.1)
Sodium: 131 mmol/L — ABNORMAL LOW (ref 135–145)

## 2022-01-24 LAB — URINE CULTURE: Culture: NO GROWTH

## 2022-01-24 MED ORDER — IPRATROPIUM-ALBUTEROL 0.5-2.5 (3) MG/3ML IN SOLN: 3.0000 mL | RESPIRATORY_TRACT | Status: DC | PRN

## 2022-01-24 MED ORDER — HYDRALAZINE HCL 20 MG/ML IJ SOLN
10.0000 mg | INTRAMUSCULAR | Status: DC | PRN
  Administered 2022-01-25: 10 mg via INTRAVENOUS
  Filled 2022-01-24: qty 1

## 2022-01-24 MED ORDER — TRAZODONE HCL 50 MG PO TABS: 50.0000 mg | ORAL_TABLET | Freq: Every evening | ORAL | Status: DC | PRN

## 2022-01-24 MED ORDER — METOPROLOL TARTRATE 5 MG/5ML IV SOLN: 5.0000 mg | INTRAVENOUS | Status: DC | PRN

## 2022-01-24 NOTE — Progress Notes (Addendum)
PROGRESS NOTE    [name]  JEH:[id number] DOB: 08/01/1927 DOA: 01/22/2022 PCP: Eulas Post, MD   Brief Narrative:  88 year old with history of prior PE on Eliquis, HTN, HLD, hyponatremia, lower extremity edema, goiter, osteoarthritis, RLS, physical disability wheelchair-bound sent to the ED from [facility] for evaluation of fatigue and cough.  Patient was moved there about a week ago and since then off-and-on has some delirium.  Patient has been dealing with hyponatremia outpatient for the past 3 months and HCTZ was discontinued.  CT of the chest on 12/9 showed nondisplaced left anterior fifth and sixth rib fractures with possible pneumonia therefore started on Rocephin and azithromycin.   Assessment & Plan:  Principal Problem:   Community acquired pneumonia of left lower lobe of lung Active Problems:   Hyponatremia   Hypertension   RLS (restless legs syndrome)   History of pulmonary embolus (PE)   Physical debility   CAP (community acquired pneumonia)    Community acquired pneumonia of left lower lobe of lung CXR with airspace disease left lower lobe concerning for pneumonia.  Treated for the same 3 months ago.  COVID and influenza negative.  On empiric IV Rocephin and azithromycin.  As needed bronchodilators, I-S, flutter valve Continue ceftriaxone and azithromycin Currently on room air. -Follow blood cultures, strep pneumonia urinary antigen-negative   Acute on chronic hyponatremia This appears to be overall chronic, was taken off HCTZ by PCP has been dealing with this outpatient.  Some clinical signs of dehydration therefore will avoid any further diuretics.  Sodium yesterday 128, today pending   History of pulmonary embolus (PE) Continue home Eliquis 5 mg twice daily   RLS (restless legs syndrome) Continue home pramipexole.   Hypertension Patient is on home Cardizem and losartan.  IV as needed ordered   Generalized weakness PT OT assessment.   TSH normal Uses 2 persons assist, uses wheelchair Fall precautions   DVT prophylaxis: Eliquis Code Status: DNR, confirmed on admission Family Communication: Called daughter [name] Disposition Plan: From ALF and likely discharge to same facility pending clinical progress Consults called: None   Currently PT/OT assessment is pending  Subjective: Patient overall still feels significantly weak.  Denies any other complaints.   Examination:  General exam: Appears calm and comfortable, generally ill and frail appearing. Respiratory system: Minimal bibasilar rhonchi Cardiovascular system: S1 & S2 heard, RRR. No JVD, murmurs, rubs, gallops or clicks. No pedal edema. Gastrointestinal system: Abdomen is nondistended, soft and nontender. No organomegaly or masses felt. Normal bowel sounds heard. Central nervous system: Alert and oriented. No focal neurological deficits. Extremities: Symmetric 5 x 5 power. Skin: No rashes, lesions or ulcers Psychiatry: Judgement and insight appear normal. Mood & affect appropriate.     Objective: Vitals:   01/23/22 2152 01/23/22 2152 01/23/22 2259 01/24/22 0455  BP: (!) 162/74 (!) 162/74 (!) 162/74 (!) 114/53  Pulse:  100 100 82  Resp:  '16 16 14  '$ Temp:  98.3 F (36.8 C) 98.3 F (36.8 C) 98.7 F (37.1 C)  TempSrc:  Oral Oral Oral  SpO2:  97%  90%    Intake/Output Summary (Last 24 hours) at 01/24/2022 0747 Last data filed at 01/24/2022 0547 Gross per 24 hour  Intake 280 ml  Output 450 ml  Net -170 ml   There were no vitals filed for this visit.   Data Reviewed:   CBC: Recent Labs  Lab 01/22/22 1859 01/23/22 0641  WBC 9.0 5.8  NEUTROABS 5.5  --  HGB 12.1 10.1*  HCT 36.6 30.3*  MCV 86.1 86.6  PLT 488* 929   Basic Metabolic Panel: Recent Labs  Lab 01/22/22 1859 01/23/22 0641  NA 127* 128*  K 4.1 4.0  CL 92* 96*  CO2 25 24  GLUCOSE 136* 97  BUN 15 14  CREATININE 0.70 0.65  CALCIUM 8.9 8.2*  MG 1.7  --    GFR: CrCl  cannot be calculated (Unknown ideal weight.). Liver Function Tests: Recent Labs  Lab 01/22/22 1859  AST 36  ALT 24  ALKPHOS 57  BILITOT 0.5  PROT 6.9  ALBUMIN 3.3*   Recent Labs  Lab 01/22/22 1859  LIPASE 51   Recent Labs  Lab 01/22/22 1854  AMMONIA 21   Coagulation Profile: No results for input(s): "INR", "PROTIME" in the last 168 hours. Cardiac Enzymes: No results for input(s): "CKTOTAL", "CKMB", "CKMBINDEX", "TROPONINI" in the last 168 hours. BNP (last 3 results) No results for input(s): "PROBNP" in the last 8760 hours. HbA1C: No results for input(s): "HGBA1C" in the last 72 hours. CBG: No results for input(s): "GLUCAP" in the last 168 hours. Lipid Profile: No results for input(s): "CHOL", "HDL", "LDLCALC", "TRIG", "CHOLHDL", "LDLDIRECT" in the last 72 hours. Thyroid Function Tests: Recent Labs    01/23/22 0641  TSH 2.137   Anemia Panel: No results for input(s): "VITAMINB12", "FOLATE", "FERRITIN", "TIBC", "IRON", "RETICCTPCT" in the last 72 hours. Sepsis Labs: Recent Labs  Lab 01/22/22 1854  LATICACIDVEN 1.9    Recent Results (from the past 240 hour(s))  Blood culture (routine x 2)     Status: None (Preliminary result)   Collection Time: 01/22/22  6:54 PM   Specimen: BLOOD  Result Value Ref Range Status   Specimen Description   Final    BLOOD LEFT ANTECUBITAL Performed at [facility] [address], [place], [place] [address]    Special Requests   Final    BOTTLES DRAWN AEROBIC AND ANAEROBIC Blood Culture adequate volume Performed at [facility] [address] [place] [address] [place], [place] [address]    Culture   Final    NO GROWTH < 12 HOURS Performed at [facility] [address] [place], [place] [address]    Report Status PENDING  Incomplete  Resp Panel by RT-PCR (Flu A&B, Covid) Anterior Nasal Swab     Status: None   Collection Time: 01/22/22  6:54 PM   Specimen: Anterior Nasal Swab  Result Value Ref  Range Status   SARS Coronavirus 2 by RT PCR NEGATIVE NEGATIVE Final    Comment: (NOTE) SARS-CoV-2 target nucleic acids are NOT DETECTED.  The SARS-CoV-2 RNA is generally detectable in upper respiratory specimens during the acute phase of infection. The lowest concentration of SARS-CoV-2 viral copies this assay can detect is 138 copies/mL. A negative result does not preclude SARS-Cov-2 infection and should not be used as the sole basis for treatment or other patient management decisions. A negative result may occur with  improper specimen collection/handling, submission of specimen other than nasopharyngeal swab, presence of viral mutation(s) within the areas targeted by this assay, and inadequate number of viral copies(<138 copies/mL). A negative result must be combined with clinical observations, patient history, and epidemiological information. The expected result is Negative.  Fact Sheet for Patients:  EntrepreneurPulse.com.au  Fact Sheet for Healthcare Providers:  IncredibleEmployment.be  This test is no t yet approved or cleared by the [place] FDA and  has been authorized for detection and/or diagnosis of SARS-CoV-2  by FDA under an Emergency Use Authorization (EUA). This EUA will remain  in effect (meaning this test can be used) for the duration of the COVID-19 declaration under Section 564(b)(1) of the Act, 21 U.S.C.section 360bbb-3(b)(1), unless the authorization is terminated  or revoked sooner.       Influenza A by PCR NEGATIVE NEGATIVE Final   Influenza B by PCR NEGATIVE NEGATIVE Final    Comment: (NOTE) The Xpert Xpress SARS-CoV-2/FLU/RSV plus assay is intended as an aid in the diagnosis of influenza from Nasopharyngeal swab specimens and should not be used as a sole basis for treatment. Nasal washings and aspirates are unacceptable for Xpert Xpress SARS-CoV-2/FLU/RSV testing.  Fact Sheet for  Patients: EntrepreneurPulse.com.au  Fact Sheet for Healthcare Providers: IncredibleEmployment.be  This test is not yet approved or cleared by the [place] FDA and has been authorized for detection and/or diagnosis of SARS-CoV-2 by FDA under an Emergency Use Authorization (EUA). This EUA will remain in effect (meaning this test can be used) for the duration of the COVID-19 declaration under Section 564(b)(1) of the Act, 21 U.S.C. section 360bbb-3(b)(1), unless the authorization is terminated or revoked.  Performed at [facility], [place] [address] [place], [place] [address]   Blood culture (routine x 2)     Status: None (Preliminary result)   Collection Time: 01/23/22  6:41 AM   Specimen: BLOOD RIGHT HAND  Result Value Ref Range Status   Specimen Description   Final    BLOOD RIGHT HAND Performed at [facility], [place] [address] [place], [place] [address]    Special Requests   Final    BOTTLES DRAWN AEROBIC ONLY Blood Culture adequate volume Performed at [facility] [address], [place], [place] [address]    Culture PENDING  Incomplete   Report Status PENDING  Incomplete         Radiology Studies: CT CHEST WO CONTRAST  Result Date: 01/23/2022 CLINICAL DATA:  Cough, chronic/persistent greater than 8 weeks. EXAM: CT CHEST WITHOUT CONTRAST TECHNIQUE: Multidetector CT imaging of the chest was performed following the standard protocol without IV contrast. RADIATION DOSE REDUCTION: This exam was performed according to the departmental dose-optimization program which includes automated exposure control, adjustment of the mA and/or kV according to patient size and/or use of iterative reconstruction technique. COMPARISON:  Chest radiography yesterday.  Chest CT 11/15/2018 FINDINGS: Cardiovascular: Heart size upper limits of normal. Coronary artery calcification and/or stents. Aortic atherosclerotic  calcification. Mediastinum/Nodes: No evidence of hilar or mediastinal mass or lymphadenopathy. Lungs/Pleura: On the right, there is chronic pulmonary scarring with slightly more atelectasis in the right upper and right lower lobe. On the left, there is chronic elevation of the left hemidiaphragm with chronic volume loss at the left lung base, compared overly more pronounced in the left lower lobe which could go along with atelectasis or atelectatic pneumonia. No pleural fluid. No pneumothorax. Upper Abdomen: Negative Musculoskeletal: There are nondisplaced fractures of the left anterior fifth and sixth ribs, favored to be recent. No acute spinal finding. IMPRESSION: 1. Nondisplaced fractures of the left anterior fifth and sixth ribs, favored to be recent. 2. Chronic elevation of the left hemidiaphragm with chronic volume loss at the left lung base. Compared to the prior study, this is more pronounced in the left lower lobe which could go along with active atelectasis or atelectatic pneumonia. 3. Chronic pulmonary scarring on the right with slightly more atelectasis in the right upper and right lower lobe. 4.  Aortic atherosclerosis. Aortic Atherosclerosis (ICD10-I70.0). Electronically Signed   By: Nelson Chimes M.D.   On: 01/23/2022 12:51   DG Chest Portable 1 View  Result Date: 01/22/2022 CLINICAL DATA:  Cough altered EXAM: PORTABLE CHEST 1 VIEW COMPARISON:  10/21/2021, CT 11/15/2018 FINDINGS: Hypoventilatory changes with probable subsegmental atelectasis at the bases. Mild airspace disease at left base with possible small effusion. Large hiatal hernia. Bilateral shoulder replacements. Aortic atherosclerosis IMPRESSION: Low lung volumes. Subsegmental atelectasis right base. Airspace disease at left base could reflect atelectasis or pneumonia. Possible small left effusion. Large hiatal hernia Electronically Signed   By: Donavan Foil M.D.   On: 01/22/2022 19:47        Scheduled Meds:  apixaban  5 mg Oral  BID   azithromycin  500 mg Oral QHS   baclofen  10 mg Oral QHS   diltiazem  120 mg Oral QHS   guaiFENesin  600 mg Oral BID   losartan  100 mg Oral Daily   melatonin  5 mg Oral QHS   senna-docusate  2 tablet Oral QHS   Continuous Infusions:  cefTRIAXone (ROCEPHIN)  IV Stopped (01/23/22 2202)     LOS: 1 day   Time spent= 35 mins    Shakur Lembo Arsenio Loader, MD [facility]  If 7PM-7AM, please contact night-coverage  01/24/2022, 7:47 AM

## 2022-01-24 NOTE — TOC Initial Note (Signed)
Transition of Care [facility]) - Initial/Assessment Note    Patient Details  Name: NAILANI FULL MRN: [id number] Date of Birth: 1927-06-27  Transition of Care [facility]) CM/SW Contact:    Henrietta Dine, RN Phone Number: 01/24/2022, 3:25 PM  Clinical Narrative:                 Pt from [facility]; spoke with Shirlee Limerick at facility to verify pt's level of care; she says the pt is from [facility] and a d/c summary and FL2 are needed for pt return; spoke w/ pt's dtr Kennis Carina [phone]) and she agrees to pt returning to facility; she says the pt wears glasses, and hearing aids; she also says the pt has upper partial; she also says the pt is incontinent of bladder but continent of bowel; the pt uses a wheelchair; will complete FL2 and submit for MD signature.        Patient Goals and CMS Choice        Expected Discharge Plan and Services                                                Prior Living Arrangements/Services                       Activities of Daily Living Home Assistive Devices/Equipment: Eyeglasses, Hearing aid ADL Screening (condition at time of admission) Patient's cognitive ability adequate to safely complete daily activities?: Yes Is the patient deaf or have difficulty hearing?: Yes Does the patient have difficulty seeing, even when wearing glasses/contacts?: Yes Does the patient have difficulty concentrating, remembering, or making decisions?: Yes Patient able to express need for assistance with ADLs?: Yes Does the patient have difficulty dressing or bathing?: Yes Independently performs ADLs?: No Communication: Needs assistance Is this a change from baseline?: Pre-admission baseline Dressing (OT): Needs assistance Is this a change from baseline?: Pre-admission baseline Grooming: Needs assistance Is this a change from baseline?: Pre-admission baseline Feeding: Independent Bathing: Needs assistance Is this a change from baseline?:  Pre-admission baseline Toileting: Dependent Is this a change from baseline?: Pre-admission baseline In/Out Bed: Dependent Is this a change from baseline?: Pre-admission baseline Walks in Home: Dependent Is this a change from baseline?: Pre-admission baseline Does the patient have difficulty walking or climbing stairs?: Yes Weakness of Legs: Both Weakness of Arms/Hands: Both  Permission Sought/Granted                  Emotional Assessment              Admission diagnosis:  Tachypnea [R06.82] Hyponatremia [E87.1] Altered mental status, unspecified altered mental status type [R41.82] Community acquired pneumonia of left lower lobe of lung [J18.9] CAP (community acquired pneumonia) [J18.9] Patient Active Problem List   Diagnosis Date Noted   CAP (community acquired pneumonia) 01/23/2022   Community acquired pneumonia of left lower lobe of lung 01/22/2022   History of pulmonary embolus (PE) 01/22/2022   Physical debility 01/22/2022   Hyponatremia 01/22/2022   RLS (restless legs syndrome) 08/04/2020   Hypertension 12/12/2018   Left leg weakness 04/19/2013   Hyperlipidemia 01/11/2011   Pulmonary emboli ([place]) 06/24/2010   CARDIOMEGALY 03/16/2010   COLONIC POLYPS 02/20/2010   GOITER 02/20/2010   HYPERLIPIDEMIA 02/20/2010   GERD 02/20/2010   Osteoarthritis of multiple joints 02/20/2010   BACK PAIN, LUMBAR, CHRONIC  02/20/2010   Urinary incontinence 02/20/2010   DIVERTICULITIS, HX OF 02/20/2010   PCP:  Eulas Post, MD Pharmacy:   [facility], [place] - [place] [address] [place] [address] Phone: [phone] Fax: [phone]     Social Determinants of Health (SDOH) Interventions Utilities Interventions: Intervention Not Indicated  Readmission Risk Interventions     No data to display

## 2022-01-24 NOTE — Progress Notes (Signed)
RN assessed PT and during med administration coughing noted with thin liquids. Pt repositioned HOB 45 degrees. Meds given whole with applesauce and tolerated better but clearing throat and a small amount of coughing noted. Son at bedside and denies swallowing issues as well as PT denies swallowing issues. Larger pills seemed harder to swallow and required more cueing.

## 2022-01-24 NOTE — NC FL2 (Signed)
[place] LEVEL OF CARE FORM     IDENTIFICATION  Patient Name: [name] Birthdate: 1927-09-14 Sex: female Admission Date (Current Location): 01/22/2022  [facility] and [place] Number:  Herbalist and Address:  [facility],  [employer] [place], [place]      Provider Number: [id number]  Attending Physician Name and Address:  Damita Lack, MD  Relative Name and Phone Number:  Kennis Carina (dtr) [phone]    Current Level of Care: Hospital Recommended Level of Care: [place] Prior Approval Number:    Date Approved/Denied:   PASRR Number: [id number]  Discharge Plan: Other (Comment) ([place])    Current Diagnoses: Patient Active Problem List   Diagnosis Date Noted   CAP (community acquired pneumonia) 01/23/2022   Community acquired pneumonia of left lower lobe of lung 01/22/2022   History of pulmonary embolus (PE) 01/22/2022   Physical debility 01/22/2022   Hyponatremia 01/22/2022   RLS (restless legs syndrome) 08/04/2020   Hypertension 12/12/2018   Left leg weakness 04/19/2013   Hyperlipidemia 01/11/2011   Pulmonary emboli ([place]) 06/24/2010   CARDIOMEGALY 03/16/2010   COLONIC POLYPS 02/20/2010   GOITER 02/20/2010   HYPERLIPIDEMIA 02/20/2010   GERD 02/20/2010   Osteoarthritis of multiple joints 02/20/2010   BACK PAIN, LUMBAR, CHRONIC 02/20/2010   Urinary incontinence 02/20/2010   DIVERTICULITIS, HX OF 02/20/2010    Orientation RESPIRATION BLADDER Height & Weight     Self, Place (oriented to person, place; disoriented to time, situation)  Normal Incontinent Weight:   Height:     BEHAVIORAL SYMPTOMS/MOOD NEUROLOGICAL BOWEL NUTRITION STATUS      Continent Diet (regular)  AMBULATORY STATUS COMMUNICATION OF NEEDS Skin   Total Care Verbally Other (Comment) (erythema to groin, breast, elbow)                       Personal Care Assistance Level of  Assistance  Bathing, Feeding, Dressing Bathing Assistance: Maximum assistance Feeding assistance: Limited assistance Dressing Assistance: Maximum assistance     Functional Limitations Info  Sight, Hearing, Speech Sight Info: Impaired Hearing Info: Impaired Speech Info: Adequate    SPECIAL CARE FACTORS FREQUENCY                       Contractures Contractures Info: Not present    Additional Factors Info  Code Status, Allergies Code Status Info: DNR Allergies Info: NKDA           Current Medications (01/24/2022):  This is the current hospital active medication list Current Facility-Administered Medications  Medication Dose Route Frequency Provider Last Rate Last Admin   acetaminophen (TYLENOL) tablet 650 mg  650 mg Oral Q6H PRN Lenore Cordia, MD   650 mg at 01/23/22 2134   Or   acetaminophen (TYLENOL) suppository 650 mg  650 mg Rectal Q6H PRN Lenore Cordia, MD       apixaban (ELIQUIS) tablet 5 mg  5 mg Oral BID Zada Finders R, MD   5 mg at 01/24/22 0944   azithromycin (ZITHROMAX) tablet 500 mg  500 mg Oral QHS Leodis Sias T, RPH   500 mg at 01/23/22 2133   baclofen (LIORESAL) tablet 10 mg  10 mg Oral QHS Zada Finders R, MD   10 mg at 01/23/22 2132   cefTRIAXone (ROCEPHIN) 2 g in sodium chloride 0.9 % 100 mL IVPB  2 g Intravenous Q24H Lenore Cordia, MD  Stopped at 01/23/22 2202   diltiazem (CARDIZEM CD) 24 hr capsule 120 mg  120 mg Oral QHS Zada Finders R, MD   120 mg at 01/23/22 2152   guaiFENesin (MUCINEX) 12 hr tablet 600 mg  600 mg Oral BID Lenore Cordia, MD   600 mg at 01/24/22 0944   guaiFENesin (MUCINEX) 12 hr tablet 600 mg  600 mg Oral Q12H PRN Lenore Cordia, MD       hydrALAZINE (APRESOLINE) injection 10 mg  10 mg Intravenous Q4H PRN Amin, Ankit Chirag, MD       ipratropium-albuterol (DUONEB) 0.5-2.5 (3) MG/3ML nebulizer solution 3 mL  3 mL Nebulization Q4H PRN Amin, Ankit Chirag, MD       losartan (COZAAR) tablet 100 mg  100 mg Oral Daily  Zada Finders R, MD   100 mg at 01/24/22 0944   melatonin tablet 5 mg  5 mg Oral QHS Zada Finders R, MD   5 mg at 01/23/22 2133   metoprolol tartrate (LOPRESSOR) injection 5 mg  5 mg Intravenous Q4H PRN Amin, Ankit Chirag, MD       ondansetron (ZOFRAN) tablet 4 mg  4 mg Oral Q6H PRN Lenore Cordia, MD       Or   ondansetron (ZOFRAN) injection 4 mg  4 mg Intravenous Q6H PRN Zada Finders R, MD       pramipexole (MIRAPEX) tablet 0.125 mg  0.125 mg Oral QHS PRN Lenore Cordia, MD       senna-docusate (Senokot-S) tablet 1 tablet  1 tablet Oral QHS PRN Lenore Cordia, MD       senna-docusate (Senokot-S) tablet 2 tablet  2 tablet Oral QHS Irene Pap N, DO   2 tablet at 01/23/22 2132   traMADol (ULTRAM) tablet 50 mg  50 mg Oral Q12H PRN Lenore Cordia, MD   50 mg at 01/23/22 2134   traZODone (DESYREL) tablet 50 mg  50 mg Oral QHS PRN Damita Lack, MD         Discharge Medications: Please see discharge summary for a list of discharge medications.  Relevant Imaging Results:  Relevant Lab Results:   Additional Information SSN [id number]  Henrietta Dine, RN

## 2022-01-25 DIAGNOSIS — J189 Pneumonia, unspecified organism: Secondary | ICD-10-CM | POA: Diagnosis not present

## 2022-01-25 LAB — CBC
HCT: 33 % — ABNORMAL LOW (ref 36.0–46.0)
Hemoglobin: 10.9 g/dL — ABNORMAL LOW (ref 12.0–15.0)
MCH: 28.6 pg (ref 26.0–34.0)
MCHC: 33 g/dL (ref 30.0–36.0)
MCV: 86.6 fL (ref 80.0–100.0)
Platelets: 433 10*3/uL — ABNORMAL HIGH (ref 150–400)
RBC: 3.81 MIL/uL — ABNORMAL LOW (ref 3.87–5.11)
RDW: 13.5 % (ref 11.5–15.5)
WBC: 8.9 10*3/uL (ref 4.0–10.5)
nRBC: 0 % (ref 0.0–0.2)

## 2022-01-25 LAB — LACTIC ACID, PLASMA: Lactic Acid, Venous: 1.1 mmol/L (ref 0.5–1.9)

## 2022-01-25 LAB — BASIC METABOLIC PANEL
Anion gap: 9 (ref 5–15)
Anion gap: 11 (ref 5–15)
BUN: 7 mg/dL — ABNORMAL LOW (ref 8–23)
BUN: 8 mg/dL (ref 8–23)
CO2: 23 mmol/L (ref 22–32)
CO2: 20 mmol/L — ABNORMAL LOW (ref 22–32)
Calcium: 8.2 mg/dL — ABNORMAL LOW (ref 8.9–10.3)
Calcium: 8.6 mg/dL — ABNORMAL LOW (ref 8.9–10.3)
Chloride: 99 mmol/L (ref 98–111)
Chloride: 95 mmol/L — ABNORMAL LOW (ref 98–111)
Creatinine, Ser: 0.53 mg/dL (ref 0.44–1.00)
Creatinine, Ser: 0.54 mg/dL (ref 0.44–1.00)
GFR, Estimated: >60 mL/min (ref >60)
GFR, Estimated: >60 mL/min (ref >60)
Glucose, Bld: 99 mg/dL (ref 70–99)
Glucose, Bld: 123 mg/dL — ABNORMAL HIGH (ref 70–99)
Potassium: 3.9 mmol/L (ref 3.5–5.1)
Potassium: 4.8 mmol/L (ref 3.5–5.1)
Sodium: 131 mmol/L — ABNORMAL LOW (ref 135–145)
Sodium: 126 mmol/L — ABNORMAL LOW (ref 135–145)

## 2022-01-25 LAB — MAGNESIUM: Magnesium: 2.1 mg/dL (ref 1.7–2.4)

## 2022-01-25 MED ORDER — TRAMADOL HCL 50 MG PO TABS: 50.0000 mg | ORAL_TABLET | Freq: Two times a day (BID) | ORAL | 0 refills | Status: DC | PRN

## 2022-01-25 MED ORDER — BACLOFEN 10 MG PO TABS: 10.0000 mg | ORAL_TABLET | Freq: Every day | ORAL | 0 refills | Status: DC

## 2022-01-25 MED ORDER — SENNOSIDES-DOCUSATE SODIUM 8.6-50 MG PO TABS: 1.0000 | ORAL_TABLET | Freq: Every evening | ORAL | Status: DC | PRN

## 2022-01-25 MED ORDER — AMOXICILLIN-POT CLAVULANATE 875-125 MG PO TABS: 1.0000 | ORAL_TABLET | Freq: Two times a day (BID) | ORAL | 0 refills | Status: AC

## 2022-01-25 NOTE — Evaluation (Signed)
Occupational Therapy Evaluation Patient Details Name: [name] MRN: [id number] DOB: Aug 07, 1927 Today's Date: 01/25/2022   History of Present Illness Patient is a 88 year old female who was admitted with community acquired pneumonia of left lower lobe of lung, nondisplaced left anterior fifth and sixth rib fractures, acute on chronic hyponatremia. PMH: PE, RLS, HTN, weakness.   Clinical Impression    Patient is a 88 year old female who was admitted for above. Patient with recent transition to ALF for LTC with in last two weeks. Patients daughter reported that patient was able to participate more in ADLs but recently had been TD and +2 for transfers at ALF. Patient plans to d/c back to ALF at time of d/c. Recommend skilled OT services continue in next level of care to establish baseline for patient for ADLs. Patient would continue to benefit from skilled OT services at this time while admitted and after d/c to address noted deficits in order to improve overall safety and independence in ADLs.        Recommendations for follow up therapy are one component of a multi-disciplinary discharge planning process, led by the attending physician.  Recommendations may be updated based on patient status, additional functional criteria and insurance authorization.   Follow Up Recommendations  Other (comment) (return to ALF with [facility] services)     Assistance Recommended at Discharge Frequent or constant Supervision/Assistance  Patient can return home with the following Two people to help with walking and/or transfers;A lot of help with bathing/dressing/bathroom;Assistance with cooking/housework;Direct supervision/assist for medications management;Assist for transportation;Help with stairs or ramp for entrance;Direct supervision/assist for financial management    Functional Status Assessment  Patient has had a recent decline in their functional status and demonstrates the ability to make  significant improvements in function in a reasonable and predictable amount of time.  Equipment Recommendations       Recommendations for Other Services       Precautions / Restrictions Precautions Precautions: Fall Precaution Comments: incontinence, rib fx? Restrictions Weight Bearing Restrictions: No      Mobility Bed Mobility Overal bed mobility: Needs Assistance Bed Mobility: Supine to Sit     Supine to sit: Total assist, +2 for physical assistance, +2 for safety/equipment, HOB elevated     General bed mobility comments: assist with legs and trunk, use of bed pad to pull and slide and sit patient upright.    Transfers                          Balance Overall balance assessment: Needs assistance Sitting-balance support: Bilateral upper extremity supported, Feet supported Sitting balance-Leahy Scale: Poor   Postural control: Posterior lean     Standing balance comment: unable to stand                           ADL either performed or assessed with clinical judgement   ADL Overall ADL's : Needs assistance/impaired Eating/Feeding: Set up;Sitting   Grooming: Minimal assistance;Bed level   Upper Body Bathing: Bed level;Maximal assistance   Lower Body Bathing: Bed level;Maximal assistance   Upper Body Dressing : Bed level;Maximal assistance   Lower Body Dressing: Bed level;Maximal assistance   Toilet Transfer: +2 for physical assistance;+2 for safety/equipment Toilet Transfer Details (indicate cue type and reason): patient was +2 to scoot transfer from edge of bed to recliner in room. patients daughter present reporting that staff has been assisting patient +  2 since admission to ALF about two weeks ago. Toileting- Clothing Manipulation and Hygiene: Bed level;Total assistance       Functional mobility during ADLs: +2 for physical assistance;+2 for safety/equipment       Vision Baseline Vision/History: 1 Wears glasses        Perception     Praxis      Pertinent Vitals/Pain Pain Assessment Pain Assessment: No/denies pain     Hand Dominance Right   Extremity/Trunk Assessment Upper Extremity Assessment Upper Extremity Assessment: LUE deficits/detail;RUE deficits/detail RUE Deficits / Details: h/o shoulder replacement per daughter. able to ROM to about 30 degrees. LUE Deficits / Details: patient was able to abduct shoulder to about 80 degrees. uanble to AROM FF. patient also noted to have history of shoulder replacement on this side.   Lower Extremity Assessment Lower Extremity Assessment: Defer to PT evaluation RLE Deficits / Details: strong upgoing toes to stimulation, mild heel redness LLE Deficits / Details: bruised left great toe noted, upgoing toes, heel redness noted,   Cervical / Trunk Assessment Cervical / Trunk Assessment: Kyphotic   Communication Communication Communication: Other (comment) (speaks very softly)   Cognition Arousal/Alertness: Awake/alert Behavior During Therapy: Flat affect Overall Cognitive Status: History of cognitive impairments - at baseline                                 General Comments: hard to hear patient with patient with minimal vocalizations. daughter in room requesting for patient to speak up and voice thoughts. daughter was able to provide PLOF     General Comments       Exercises     Shoulder Instructions      Home Living Family/patient expects to be discharged to:: Assisted living                             Home Equipment: Wheelchair - power   Additional Comments: recenrtly moved from home to ALF.,has power reclner WC      Prior Functioning/Environment Prior Level of Function : Needs assist  Cognitive Assist : Mobility (cognitive);ADLs (cognitive) Mobility (Cognitive): Step by step cues   Physical Assist : Mobility (physical) Mobility (physical): Bed mobility;Transfers   Mobility Comments: 2 assist to  pivot  to [facility] per daughter. ADLs Comments: total assist, patients daughter reported that she has a private caregiver that sits with the patient at ALF as well.        OT Problem List: Decreased activity tolerance;Impaired balance (sitting and/or standing);Decreased safety awareness;Decreased knowledge of precautions;Decreased knowledge of use of DME or AE;Cardiopulmonary status limiting activity;Impaired UE functional use      OT Treatment/Interventions: Self-care/ADL training;Energy conservation;Therapeutic exercise;DME and/or AE instruction;Therapeutic activities;Patient/family education;Balance training    OT Goals(Current goals can be found in the care plan section) Acute Rehab OT Goals Patient Stated Goal: for patient to get back to ALF OT Goal Formulation: With family Time For Goal Achievement: 02/08/22 Potential to Achieve Goals: Fair  OT Frequency: Min 2X/week    Co-evaluation PT/OT/SLP Co-Evaluation/Treatment: Yes Reason for Co-Treatment: To address functional/ADL transfers;For patient/therapist safety PT goals addressed during session: Mobility/safety with mobility OT goals addressed during session: ADL's and self-care      AM-PAC OT "6 Clicks" Daily Activity     Outcome Measure Help from another person eating meals?: A Little Help from another person taking care of personal grooming?: A Lot Help  from another person toileting, which includes using toliet, bedpan, or urinal?: Total Help from another person bathing (including washing, rinsing, drying)?: Total Help from another person to put on and taking off regular upper body clothing?: Total Help from another person to put on and taking off regular lower body clothing?: Total 6 Click Score: 9   End of Session Equipment Utilized During Treatment: Gait belt Nurse Communication: Other (comment) (ok to see patient. recommendations for d/c.)  Activity Tolerance: Patient limited by fatigue Patient left: in chair;with call  bell/phone within reach;with family/visitor present  OT Visit Diagnosis: Unsteadiness on feet (R26.81);Muscle weakness (generalized) (M62.81);Other abnormalities of gait and mobility (R26.89)                Time: 8115-7262 OT Time Calculation (min): 18 min Charges:  OT General Charges $OT Visit: 1 Visit OT Evaluation $OT Eval Moderate Complexity: 1 Mod  Nathian Stencil OTR/L, MS Acute Rehabilitation Department Office# [phone]   Willa Rough 01/25/2022, 2:47 PM

## 2022-01-25 NOTE — Evaluation (Signed)
Physical Therapy Evaluation Patient Details Name: [name] MRN: [id number] DOB: 1928-02-10 Today's Date: 01/25/2022  History of Present Illness  [name] is a 88 y.o. female with medical history significant for history of PE on Eliquis, HTN, HLD, hyponatremia, lower extremity edema, OA, goiter, RLS, physical debility and wheelchair dependent who presented to the ED 01/22/22 from [facility]F for evaluation of fatigue and cough, hynatremia.  Clinical Impression  Pt admitted with above diagnosis.  Pt currently with functional limitations due to the deficits listed below (see PT Problem List). Pt will benefit from skilled PT to decrease burden of care with mobility to allow discharge  back to ALF.    The patient's daughter present to provide information. Patient moved to ALF recently, has been requiring 2 person assistance for transfers and ADL's . Patient  has a sitter/caregiver  daytime at ALF.  Patient very quiet.     Recommendations for follow up therapy are one component of a multi-disciplinary discharge planning process, led by the attending physician.  Recommendations may be updated based on patient status, additional functional criteria and insurance authorization.  Follow Up Recommendations Home health PT (return to ALF) at 2 person assistance level      Assistance Recommended at Discharge Frequent or constant Supervision/Assistance  Patient can return home with the following  Two people to help with walking and/or transfers;A lot of help with bathing/dressing/bathroom    Equipment Recommendations None recommended by PT  Recommendations for Other Services       Functional Status Assessment Patient has not had a recent decline in their functional status     Precautions / Restrictions Precautions Precautions: Fall Precaution Comments: incontinence      Mobility  Bed Mobility Overal bed mobility: Needs Assistance Bed Mobility: Supine to Sit      Supine to sit: Total assist, +2 for physical assistance, +2 for safety/equipment, HOB elevated     General bed mobility comments: assist with legs and trunk, use of bed pad to pull and slide and sit patient upright.    Transfers Overall transfer level: Needs assistance   Transfers: Sit to/from Stand, Bed to chair/wheelchair/BSC       Squat pivot transfers: +2 physical assistance, Total assist, +2 safety/equipment     General transfer comment: using bed  pad,  lifted and scoot over to recliner, armrest dropped down.    Ambulation/Gait                  Stairs            Wheelchair Mobility    Modified Rankin (Stroke Patients Only)       Balance Overall balance assessment: Needs assistance Sitting-balance support: Bilateral upper extremity supported, Feet supported Sitting balance-Leahy Scale: Poor   Postural control: Posterior lean     Standing balance comment: does not stand                             Pertinent Vitals/Pain Pain Assessment Pain Assessment: No/denies pain    Home Living Family/patient expects to be discharged to:: Assisted living                 Home Equipment: Wheelchair - power Additional Comments: recenrtly moved from home to ALF.,has power reclner WC    Prior Function Prior Level of Function : Needs assist  Cognitive Assist : Mobility (cognitive);ADLs (cognitive) Mobility (Cognitive): Step by step cues   Physical  Assist : Mobility (physical) Mobility (physical): Bed mobility;Transfers   Mobility Comments: 2 assist to  pivot to [facility] per daughter. ADLs Comments: total assist     Hand Dominance   Dominant Hand: Right    Extremity/Trunk Assessment        Lower Extremity Assessment Lower Extremity Assessment: RLE deficits/detail;LLE deficits/detail RLE Deficits / Details: strong upgoing toes to stimulation, mild heel redness LLE Deficits / Details: bruised left great toe noted, upgoing toes,  heel redness noted,    Cervical / Trunk Assessment Cervical / Trunk Assessment: Kyphotic  Communication   Communication:  (voice very low)  Cognition Arousal/Alertness: Awake/alert Behavior During Therapy: Flat affect Overall Cognitive Status: History of cognitive impairments - at baseline                                 General Comments: per daughter, move from her home to ALF has affected her MS, less verbal,        General Comments      Exercises     Assessment/Plan    PT Assessment Patient needs continued PT services  PT Problem List Decreased strength;Decreased cognition;Decreased activity tolerance;Decreased mobility;Decreased safety awareness;Decreased balance       PT Treatment Interventions Therapeutic exercise;Balance training;Functional mobility training;Therapeutic activities;Patient/family education;Cognitive remediation    PT Goals (Current goals can be found in the Care Plan section)  Acute Rehab PT Goals Patient Stated Goal: to return to ALF PT Goal Formulation: With family Time For Goal Achievement: 02/08/22 Potential to Achieve Goals: Fair    Frequency Min 1X/week     Co-evaluation               AM-PAC PT "6 Clicks" Mobility  Outcome Measure Help needed turning from your back to your side while in a flat bed without using bedrails?: Total Help needed moving from lying on your back to sitting on the side of a flat bed without using bedrails?: Total Help needed moving to and from a bed to a chair (including a wheelchair)?: Total Help needed standing up from a chair using your arms (e.g., wheelchair or bedside chair)?: Total Help needed to walk in hospital room?: Total Help needed climbing 3-5 steps with a railing? : Total 6 Click Score: 6    End of Session Equipment Utilized During Treatment: Gait belt Activity Tolerance: Patient tolerated treatment well Patient left: in chair;with call bell/phone within reach Nurse  Communication: Mobility status PT Visit Diagnosis: Unsteadiness on feet (R26.81);Muscle weakness (generalized) (M62.81)    Time: 1610-9604 PT Time Calculation (min) (ACUTE ONLY): 18 min   Charges:   PT Evaluation $PT Eval Low Complexity: [place] Office [phone] Weekend [phone]   Claretha Cooper 01/25/2022, 1:40 PM

## 2022-01-25 NOTE — Progress Notes (Signed)
Report called to Engineer, agricultural at [employer].

## 2022-01-25 NOTE — Progress Notes (Signed)
Attempted to call report to [facility], no answer.

## 2022-01-25 NOTE — Progress Notes (Signed)
       CROSS COVER NOTE  NAME: [name] MRN: [id number] DOB : [birth year]    Date of Service   01/25/2022   HPI/Events of Note   Notified by RN for concern over parameters for discharge at check for transport.  Notified by RN for slight tachycardia of 102, BP 183/74, temperature 100.2 F oral.  Parameters are above acceptable for acceptance to skilled nursing facility.  Transport cancelled for tonight.    Interventions   Plan: Treating temp. Labs ordered Discharge pending for tomorrow.        Gershon Cull BSN MSNA MSN ACNPC-AG Acute Care Nurse Practitioner [place]

## 2022-01-25 NOTE — TOC Transition Note (Addendum)
Transition of Care [facility]) - CM/SW Discharge Note   Patient Details  Name: [name] MRN: [id number] Date of Birth: [birth year]  Transition of Care [facility]) CM/SW Contact:  Angelita Ingles, RN Phone Number:[phone]  01/25/2022, 2:43 PM   Clinical Narrative:    CM received confirmation that patient can return to [facility]. Message has been sent to MD and nurse. D/c summary sent via hub.  Please call report to  [phone]  1515 Transportation has been arranged via [facility].         Patient Goals and CMS Choice        Discharge Placement                       Discharge Plan and Services                                     Social Determinants of Health (SDOH) Interventions Utilities Interventions: Intervention Not Indicated   Readmission Risk Interventions     No data to display

## 2022-01-25 NOTE — TOC Progression Note (Addendum)
Transition of Care [facility]) - Progression Note    Patient Details  Name: KEYLIE BEAVERS MRN: [id number] Date of Birth: October 09, 1927  Transition of Care [facility]) CM/SW [place], RN Phone Number:[phone]  01/25/2022, 10:48 AM  Clinical Narrative:    Patient is from Assisted living at [facility] per previous documentation from [facility]. CM has attempted to contact [facility] to inquire about needed documentation prior to patients return. CM forwarded to ext [phone]. Message has been left. TOC will continue to follow.  1217 CM has attempted to call [facility] again to confirm that patient can  return. CM forwarded to Salvatore Marvel of Social work. No answer message has been left. CM will await return call.   1230 CM received return call from Delton See at [facility]. Per Judson Roch the facility is recommending that patient have a PT evaluation for recommendation because patient was requiring more assistance prior to admit and facility would life to know if patient needs ALF level of care versus SNF. Facility acknowledges that they do have a SNF level of care. Message has been sent to MD.   1440 CM received message from Elyse Hsu at [facility] stating that patient can return to facility. Message has been sent to MD and nurse.          Expected Discharge Plan and Services           Expected Discharge Date: 01/25/22                                     Social Determinants of Health (SDOH) Interventions Utilities Interventions: Intervention Not Indicated  Readmission Risk Interventions     No data to display

## 2022-01-25 NOTE — Discharge Summary (Signed)
Physician Discharge Summary  [name] FAO:[id number] DOB: 1927-11-30 DOA: 01/22/2022  PCP: Eulas Post, MD  Admit date: 01/22/2022 Discharge date: 01/26/2022  Admitted From: Assisted living Disposition: Assisted living  Recommendations for Outpatient Follow-up:  Follow up with PCP in 1-2 weeks Please obtain BMP/CBC in 3-4 days. Complete course of oral Augmentin   Discharge Condition: Stable CODE STATUS: DNR Diet recommendation: 2 g salt  Brief/Interim Summary: 88 year old with history of prior PE on Eliquis, HTN, HLD, hyponatremia, lower extremity edema, goiter, osteoarthritis, RLS, physical disability wheelchair-bound sent to the ED from [facility] for evaluation of fatigue and cough.  Patient was moved there about a week ago and since then off-and-on has some delirium.  Patient has been dealing with hyponatremia outpatient for the past 3 months and HCTZ was discontinued.  CT of the chest on 12/9 showed nondisplaced left anterior fifth and sixth rib fractures with possible pneumonia therefore started on Rocephin and azithromycin. During the hospitalization patient did much better, sodium levels improved with IV fluids.  Today sodium is 131, doing well.  Stable for discharge. Spoke with patient's daughter and extent at bedside     Assessment & Plan:  Principal Problem:   Community acquired pneumonia of left lower lobe of lung Active Problems:   Hyponatremia   Hypertension   RLS (restless legs syndrome)   History of pulmonary embolus (PE)   Physical debility   CAP (community acquired pneumonia)     Community acquired pneumonia of left lower lobe of lung CXR with airspace disease left lower lobe concerning for pneumonia.  Treated for the same 3 months ago.  COVID and influenza negative.  On empiric IV Rocephin and azithromycin.  As needed bronchodilators, I-S, flutter valve Continue ceftriaxone and azithromycin, will transition to Augmentin Currently  on room air. -Follow blood cultures, strep pneumonia urinary antigen-negative   Acute on chronic hyponatremia This appears to be overall chronic, was taken off HCTZ by PCP has been dealing with this outpatient.  Some clinical signs of dehydration therefore will avoid any further diuretics.  Sodium level today is 131.  Probably acceptable range in her case would be 126 and above.   History of pulmonary embolus (PE) Continue home Eliquis 5 mg twice daily   RLS (restless legs syndrome) Continue home pramipexole.   Hypertension Patient is on home Cardizem and losartan.  IV as needed ordered   Generalized weakness PT OT assessment.  TSH normal Uses 2 persons assist, uses wheelchair Fall precautions    Discharge Diagnoses:  Principal Problem:   Community acquired pneumonia of left lower lobe of lung Active Problems:   Hyponatremia   Hypertension   RLS (restless legs syndrome)   History of pulmonary embolus (PE)   Physical debility   CAP (community acquired pneumonia)      Consultations: None  Subjective: Feeling well no complaints Daughter present at bedside  Discharge Exam: Vitals:   01/25/22 2332 01/26/22 0432  BP: (!) 146/82 (!) 107/56  Pulse: (!) 109 90  Resp: 20 16  Temp: 98.7 F (37.1 C) 98.9 F (37.2 C)  SpO2: 95% 96%   Vitals:   01/25/22 2056 01/25/22 2202 01/25/22 2332 01/26/22 0432  BP: (!) 183/74 (!) 180/83 (!) 146/82 (!) 107/56  Pulse: (!) 102 (!) 104 (!) 109 90  Resp: '20 18 20 16  '$ Temp: 100.2 F (37.9 C) 99.5 F (37.5 C) 98.7 F (37.1 C) 98.9 F (37.2 C)  TempSrc: Oral Oral Oral Oral  SpO2: 94%  95% 95% 96%    General: Pt is alert, awake, not in acute distress Cardiovascular: RRR, S1/S2 +, no rubs, no gallops Respiratory: CTA bilaterally, no wheezing, no rhonchi Abdominal: Soft, NT, ND, bowel sounds + Extremities: no edema, no cyanosis  Discharge Instructions   Allergies as of 01/26/2022   No Known Allergies      Medication List      STOP taking these medications    amoxicillin-clavulanate 500-125 MG tablet Commonly known as: AUGMENTIN Replaced by: amoxicillin-clavulanate 875-125 MG tablet       TAKE these medications    acetaminophen 500 MG tablet Commonly known as: TYLENOL Take 500 mg by mouth at bedtime.   AeroChamber MV inhaler Use as instructed   albuterol 108 (90 Base) MCG/ACT inhaler Commonly known as: VENTOLIN HFA Inhale 2 puffs into the lungs every 4 (four) hours as needed for wheezing or shortness of breath.   amoxicillin-clavulanate 875-125 MG tablet Commonly known as: AUGMENTIN Take 1 tablet by mouth 2 (two) times daily for 5 days. Replaces: amoxicillin-clavulanate 500-125 MG tablet   apixaban 5 MG Tabs tablet Commonly known as: Eliquis AT THE START OF THERAPY , TAKE 2 TABLETS TWICE A DAY FOR 7 DAYS AND THEN 1 TABLET TWICE A DAY THEREAFTER What changed:  how much to take how to take this when to take this additional instructions   baclofen 10 MG tablet Commonly known as: LIORESAL Take 1 tablet (10 mg total) by mouth at bedtime.   Culturelle Digestive Health Caps Take 1 capsule by mouth 2 (two) times daily before a meal.   diltiazem 120 MG 24 hr capsule Commonly known as: Cardizem CD Take 1 capsule (120 mg total) by mouth daily.   FeroSul 325 (65 FE) MG tablet Generic drug: ferrous sulfate Take 325 mg by mouth in the morning.   furosemide 20 MG tablet Commonly known as: LASIX Take 1 tablet once daily as needed for edema   losartan 100 MG tablet Commonly known as: COZAAR Take 1 tablet (100 mg total) by mouth daily.   melatonin 5 MG Tabs Take 5 mg by mouth at bedtime.   Mucinex 600 MG 12 hr tablet Generic drug: guaiFENesin Take 600 mg by mouth every 12 (twelve) hours as needed for cough.   multivitamin with minerals Tabs tablet Take 1 tablet by mouth daily.   pramipexole 0.125 MG tablet Commonly known as: MIRAPEX TAKE 1 TO 2 TABLETS BY MOUTH AT NIGHT AS NEEDED  FOR RESTLESS LEGS OR SYMPTOMS What changed: See the new instructions.   senna-docusate 8.6-50 MG tablet Commonly known as: Senokot-S Take 1 tablet by mouth at bedtime as needed for mild constipation.   traMADol 50 MG tablet Commonly known as: ULTRAM Take 1 tablet (50 mg total) by mouth every 12 (twelve) hours as needed. What changed: reasons to take this        No Known Allergies  You were cared for by a hospitalist during your hospital stay. If you have any questions about your discharge medications or the care you received while you were in the hospital after you are discharged, you can call the unit and asked to speak with the hospitalist on call if the hospitalist that took care of you is not available. Once you are discharged, your primary care physician will handle any further medical issues. Please note that no refills for any discharge medications will be authorized once you are discharged, as it is imperative that you return to your primary care physician (or  establish a relationship with a primary care physician if you do not have one) for your aftercare needs so that they can reassess your need for medications and monitor your lab values.   Procedures/Studies: CT CHEST WO CONTRAST  Result Date: 01/23/2022 CLINICAL DATA:  Cough, chronic/persistent greater than 8 weeks. EXAM: CT CHEST WITHOUT CONTRAST TECHNIQUE: Multidetector CT imaging of the chest was performed following the standard protocol without IV contrast. RADIATION DOSE REDUCTION: This exam was performed according to the departmental dose-optimization program which includes automated exposure control, adjustment of the mA and/or kV according to patient size and/or use of iterative reconstruction technique. COMPARISON:  Chest radiography yesterday.  Chest CT 11/15/2018 FINDINGS: Cardiovascular: Heart size upper limits of normal. Coronary artery calcification and/or stents. Aortic atherosclerotic calcification.  Mediastinum/Nodes: No evidence of hilar or mediastinal mass or lymphadenopathy. Lungs/Pleura: On the right, there is chronic pulmonary scarring with slightly more atelectasis in the right upper and right lower lobe. On the left, there is chronic elevation of the left hemidiaphragm with chronic volume loss at the left lung base, compared overly more pronounced in the left lower lobe which could go along with atelectasis or atelectatic pneumonia. No pleural fluid. No pneumothorax. Upper Abdomen: Negative Musculoskeletal: There are nondisplaced fractures of the left anterior fifth and sixth ribs, favored to be recent. No acute spinal finding. IMPRESSION: 1. Nondisplaced fractures of the left anterior fifth and sixth ribs, favored to be recent. 2. Chronic elevation of the left hemidiaphragm with chronic volume loss at the left lung base. Compared to the prior study, this is more pronounced in the left lower lobe which could go along with active atelectasis or atelectatic pneumonia. 3. Chronic pulmonary scarring on the right with slightly more atelectasis in the right upper and right lower lobe. 4. Aortic atherosclerosis. Aortic Atherosclerosis (ICD10-I70.0). Electronically Signed   By: Nelson Chimes M.D.   On: 01/23/2022 12:51   DG Chest Portable 1 View  Result Date: 01/22/2022 CLINICAL DATA:  Cough altered EXAM: PORTABLE CHEST 1 VIEW COMPARISON:  10/21/2021, CT 11/15/2018 FINDINGS: Hypoventilatory changes with probable subsegmental atelectasis at the bases. Mild airspace disease at left base with possible small effusion. Large hiatal hernia. Bilateral shoulder replacements. Aortic atherosclerosis IMPRESSION: Low lung volumes. Subsegmental atelectasis right base. Airspace disease at left base could reflect atelectasis or pneumonia. Possible small left effusion. Large hiatal hernia Electronically Signed   By: Donavan Foil M.D.   On: 01/22/2022 19:47     The results of significant diagnostics from this  hospitalization (including imaging, microbiology, ancillary and laboratory) are listed below for reference.     Microbiology: Recent Results (from the past 240 hour(s))  Blood culture (routine x 2)     Status: None (Preliminary result)   Collection Time: 01/22/22  6:54 PM   Specimen: BLOOD  Result Value Ref Range Status   Specimen Description   Final    BLOOD LEFT ANTECUBITAL Performed at [facility] [address] [place], [place] [address]    Special Requests   Final    BOTTLES DRAWN AEROBIC AND ANAEROBIC Blood Culture adequate volume Performed at [facility] [address] [place], [place] [address]    Culture   Final    NO GROWTH 2 DAYS Performed at [facility] [address], [place], [place] [address]    Report Status PENDING  Incomplete  Resp Panel by RT-PCR (Flu A&B, Covid) Anterior Nasal Swab     Status: None  Collection Time: 01/22/22  6:54 PM   Specimen: Anterior Nasal Swab  Result Value Ref Range Status   SARS Coronavirus 2 by RT PCR NEGATIVE NEGATIVE Final    Comment: (NOTE) SARS-CoV-2 target nucleic acids are NOT DETECTED.  The SARS-CoV-2 RNA is generally detectable in upper respiratory specimens during the acute phase of infection. The lowest concentration of SARS-CoV-2 viral copies this assay can detect is 138 copies/mL. A negative result does not preclude SARS-Cov-2 infection and should not be used as the sole basis for treatment or other patient management decisions. A negative result may occur with  improper specimen collection/handling, submission of specimen other than nasopharyngeal swab, presence of viral mutation(s) within the areas targeted by this assay, and inadequate number of viral copies(<138 copies/mL). A negative result must be combined with clinical observations, patient history, and epidemiological information. The expected result is Negative.  Fact Sheet for Patients:   EntrepreneurPulse.com.au  Fact Sheet for Healthcare Providers:  IncredibleEmployment.be  This test is no t yet approved or cleared by the [place] FDA and  has been authorized for detection and/or diagnosis of SARS-CoV-2 by FDA under an Emergency Use Authorization (EUA). This EUA will remain  in effect (meaning this test can be used) for the duration of the COVID-19 declaration under Section 564(b)(1) of the Act, 21 U.S.C.section 360bbb-3(b)(1), unless the authorization is terminated  or revoked sooner.       Influenza A by PCR NEGATIVE NEGATIVE Final   Influenza B by PCR NEGATIVE NEGATIVE Final    Comment: (NOTE) The Xpert Xpress SARS-CoV-2/FLU/RSV plus assay is intended as an aid in the diagnosis of influenza from Nasopharyngeal swab specimens and should not be used as a sole basis for treatment. Nasal washings and aspirates are unacceptable for Xpert Xpress SARS-CoV-2/FLU/RSV testing.  Fact Sheet for Patients: EntrepreneurPulse.com.au  Fact Sheet for Healthcare Providers: IncredibleEmployment.be  This test is not yet approved or cleared by the [place] FDA and has been authorized for detection and/or diagnosis of SARS-CoV-2 by FDA under an Emergency Use Authorization (EUA). This EUA will remain in effect (meaning this test can be used) for the duration of the COVID-19 declaration under Section 564(b)(1) of the Act, 21 U.S.C. section 360bbb-3(b)(1), unless the authorization is terminated or revoked.  Performed at [facility], [place] [address], [place], [place] [address]   Blood culture (routine x 2)     Status: None (Preliminary result)   Collection Time: 01/23/22  6:41 AM   Specimen: BLOOD RIGHT HAND  Result Value Ref Range Status   Specimen Description   Final    BLOOD RIGHT HAND Performed at [facility], [place] [address], [place], [place] [address]    Special  Requests   Final    BOTTLES DRAWN AEROBIC ONLY Blood Culture adequate volume Performed at [facility] [address] [place], [place] [address]    Culture   Final    NO GROWTH 2 DAYS Performed at [facility] [address] [place], [place] [address]    Report Status PENDING  Incomplete  Urine Culture     Status: None   Collection Time: 01/23/22 11:00 AM   Specimen: Urine, Clean Catch  Result Value Ref Range Status   Specimen Description   Final    URINE, CLEAN CATCH Performed at [facility], [place] [address], [place], [place] [address]    Special Requests   Final    NONE Performed at [facility]  [place] [address], [place], [place] [address]    Culture   Final    NO GROWTH Performed at [facility], [place] [address] [place], [place] [address]    Report Status 01/24/2022 FINAL  Final     Labs: BNP (last 3 results) Recent Labs    01/22/22 1854  BNP 98.3   Basic Metabolic Panel: Recent Labs  Lab 01/22/22 1859 01/23/22 0641 01/24/22 0944 01/25/22 0610 01/25/22 2236 01/26/22 0526  NA 127* 128* 131* 131* 126* 130*  K 4.1 4.0 3.9 3.9 4.8 4.0  CL 92* 96* 98 99 95* 98  CO2 '25 24 25 23 '$ 20* 23  GLUCOSE 136* 97 115* 99 123* 108*  BUN '15 14 9 '$ 7* 8 7*  CREATININE 0.70 0.65 0.57 0.53 0.54 0.55  CALCIUM 8.9 8.2* 8.4* 8.2* 8.6* 8.4*  MG 1.7  --   --  2.1  --  1.8   Liver Function Tests: Recent Labs  Lab 01/22/22 1859  AST 36  ALT 24  ALKPHOS 57  BILITOT 0.5  PROT 6.9  ALBUMIN 3.3*   Recent Labs  Lab 01/22/22 1859  LIPASE 51   Recent Labs  Lab 01/22/22 1854  AMMONIA 21   CBC: Recent Labs  Lab 01/22/22 1859 01/23/22 0641 01/25/22 2236  WBC 9.0 5.8 8.9  NEUTROABS 5.5  --   --   HGB 12.1 10.1* 10.9*  HCT 36.6 30.3* 33.0*  MCV 86.1 86.6 86.6  PLT 488* 379 433*   Cardiac Enzymes: No results for input(s): "CKTOTAL", "CKMB", "CKMBINDEX", "TROPONINI" in the last 168  hours. BNP: Invalid input(s): "POCBNP" CBG: No results for input(s): "GLUCAP" in the last 168 hours. D-Dimer No results for input(s): "DDIMER" in the last 72 hours. Hgb A1c No results for input(s): "HGBA1C" in the last 72 hours. Lipid Profile No results for input(s): "CHOL", "HDL", "LDLCALC", "TRIG", "CHOLHDL", "LDLDIRECT" in the last 72 hours. Thyroid function studies No results for input(s): "TSH", "T4TOTAL", "T3FREE", "THYROIDAB" in the last 72 hours.  Invalid input(s): "FREET3"  Anemia work up No results for input(s): "VITAMINB12", "FOLATE", "FERRITIN", "TIBC", "IRON", "RETICCTPCT" in the last 72 hours. Urinalysis    Component Value Date/Time   COLORURINE YELLOW 01/23/2022 1100   APPEARANCEUR CLEAR 01/23/2022 1100   LABSPEC 1.015 01/23/2022 1100   PHURINE 5.0 01/23/2022 1100   GLUCOSEU NEGATIVE 01/23/2022 1100   HGBUR NEGATIVE 01/23/2022 1100   BILIRUBINUR NEGATIVE 01/23/2022 1100   KETONESUR NEGATIVE 01/23/2022 1100   PROTEINUR NEGATIVE 01/23/2022 1100   NITRITE NEGATIVE 01/23/2022 1100   LEUKOCYTESUR SMALL (A) 01/23/2022 1100   Sepsis Labs Recent Labs  Lab 01/22/22 1859 01/23/22 0641 01/25/22 2236  WBC 9.0 5.8 8.9   Microbiology Recent Results (from the past 240 hour(s))  Blood culture (routine x 2)     Status: None (Preliminary result)   Collection Time: 01/22/22  6:54 PM   Specimen: BLOOD  Result Value Ref Range Status   Specimen Description   Final    BLOOD LEFT ANTECUBITAL Performed at [facility], [place] [address], [place], [place] [address]    Special Requests   Final    BOTTLES DRAWN AEROBIC AND ANAEROBIC Blood Culture adequate volume Performed at [facility] [address], [place], [place] [address]    Culture   Final    NO GROWTH 2 DAYS Performed at [facility] [address] [place], [place] [address]    Report Status PENDING  Incomplete  Resp Panel by RT-PCR (Flu A&B, Covid) Anterior Nasal  Swab     Status: None   Collection Time: 01/22/22  6:54 PM   Specimen: Anterior Nasal Swab  Result Value Ref Range Status   SARS Coronavirus 2 by RT PCR NEGATIVE NEGATIVE Final    Comment: (NOTE) SARS-CoV-2 target nucleic acids are NOT DETECTED.  The SARS-CoV-2 RNA is generally detectable in upper respiratory specimens during the acute phase of infection. The lowest concentration of SARS-CoV-2 viral copies this assay can detect is 138 copies/mL. A negative result does not preclude SARS-Cov-2 infection and should not be used as the sole basis for treatment or other patient management decisions. A negative result may occur with  improper specimen collection/handling, submission of specimen other than nasopharyngeal swab, presence of viral mutation(s) within the areas targeted by this assay, and inadequate number of viral copies(<138 copies/mL). A negative result must be combined with clinical observations, patient history, and epidemiological information. The expected result is Negative.  Fact Sheet for Patients:  EntrepreneurPulse.com.au  Fact Sheet for Healthcare Providers:  IncredibleEmployment.be  This test is no t yet approved or cleared by the [place] FDA and  has been authorized for detection and/or diagnosis of SARS-CoV-2 by FDA under an Emergency Use Authorization (EUA). This EUA will remain  in effect (meaning this test can be used) for the duration of the COVID-19 declaration under Section 564(b)(1) of the Act, 21 U.S.C.section 360bbb-3(b)(1), unless the authorization is terminated  or revoked sooner.       Influenza A by PCR NEGATIVE NEGATIVE Final   Influenza B by PCR NEGATIVE NEGATIVE Final    Comment: (NOTE) The Xpert Xpress SARS-CoV-2/FLU/RSV plus assay is intended as an aid in the diagnosis of influenza from Nasopharyngeal swab specimens and should not be used as a sole basis for treatment. Nasal washings and aspirates  are unacceptable for Xpert Xpress SARS-CoV-2/FLU/RSV testing.  Fact Sheet for Patients: EntrepreneurPulse.com.au  Fact Sheet for Healthcare Providers: IncredibleEmployment.be  This test is not yet approved or cleared by the [place] FDA and has been authorized for detection and/or diagnosis of SARS-CoV-2 by FDA under an Emergency Use Authorization (EUA). This EUA will remain in effect (meaning this test can be used) for the duration of the COVID-19 declaration under Section 564(b)(1) of the Act, 21 U.S.C. section 360bbb-3(b)(1), unless the authorization is terminated or revoked.  Performed at [facility], [place] [address], [place], [place] [address]   Blood culture (routine x 2)     Status: None (Preliminary result)   Collection Time: 01/23/22  6:41 AM   Specimen: BLOOD RIGHT HAND  Result Value Ref Range Status   Specimen Description   Final    BLOOD RIGHT HAND Performed at [facility], [place] [address] [place], [place] [address]    Special Requests   Final    BOTTLES DRAWN AEROBIC ONLY Blood Culture adequate volume Performed at [facility] [address] [place], [place] [address]    Culture   Final    NO GROWTH 2 DAYS Performed at [facility] [address], [place], [place] [address]    Report Status PENDING  Incomplete  Urine Culture     Status: None   Collection Time: 01/23/22 11:00 AM   Specimen: Urine, Clean Catch  Result Value Ref Range Status   Specimen Description   Final    URINE, CLEAN CATCH Performed at [facility], [place]  [address], [place], [place] [address]    Special Requests   Final    NONE Performed at [facility], [place] [address] [place], [place] [address]    Culture   Final    NO GROWTH Performed at [facility], [place] [address] [place], [place] [address]    Report Status 01/24/2022 FINAL  Final     Time  coordinating discharge:  I have spent 35 minutes face to face with the patient and on the ward discussing the patients care, assessment, plan and disposition with other care givers. >50% of the time was devoted counseling the patient about the risks and benefits of treatment/Discharge disposition and coordinating care.   SIGNED:   Damita Lack, MD  [facility] 01/26/2022, 7:58 AM   If 7PM-7AM, please contact night-coverage

## 2022-01-26 LAB — LEGIONELLA PNEUMOPHILA SEROGP 1 UR AG: L. pneumophila Serogp 1 Ur Ag: NEGATIVE

## 2022-01-26 LAB — BASIC METABOLIC PANEL
Anion gap: 9 (ref 5–15)
BUN: 7 mg/dL — ABNORMAL LOW (ref 8–23)
CO2: 23 mmol/L (ref 22–32)
Calcium: 8.4 mg/dL — ABNORMAL LOW (ref 8.9–10.3)
Chloride: 98 mmol/L (ref 98–111)
Creatinine, Ser: 0.55 mg/dL (ref 0.44–1.00)
GFR, Estimated: >60 mL/min (ref >60)
Glucose, Bld: 108 mg/dL — ABNORMAL HIGH (ref 70–99)
Potassium: 4 mmol/L (ref 3.5–5.1)
Sodium: 130 mmol/L — ABNORMAL LOW (ref 135–145)

## 2022-01-26 LAB — MAGNESIUM: Magnesium: 1.8 mg/dL (ref 1.7–2.4)

## 2022-01-26 NOTE — Progress Notes (Signed)
As mentioned in note below by NP, Patient's vitals were not stable for transfer to [facility]. Patient felt very warm per daughter. Her vitals were obtained as [facility] was pulling up to pick up patient. She had a temp of 100.2, elevated BP and pulse. MD was notified and discharge cancelled. Patient was given tylenol (temp)and hydralazine for elevated BP.  [facility] was notified that patient will need to stay another night.Roderick Pee

## 2022-01-26 NOTE — Care Management Important Message (Signed)
Important Message  Patient Details IM Letter given. Name: [name] MRN: [id number] Date of Birth: 1927/09/14   Medicare Important Message Given:  Yes     Kerin Salen 01/26/2022, 11:24 AM

## 2022-01-26 NOTE — Progress Notes (Signed)
Patient's transfer held up yesterday evening as during transfer she had low-grade temperature, tachycardia and elevated blood pressure.  Seen and examined at bedside.  She is eating her breakfast.  Her caregiver is at bedside who tells me patient is doing well overall  Physical exam-not in acute distress, clear to auscultation bilaterally.  Overall elderly frail   This morning vital signs remained stable.  She does have community-acquired pneumonia and will be on antibiotics Augmentin upon discharge.  Her blood cultures and urine cultures remain negative here. Overall her blood work is also unremarkable.  Will clear the patient for discharge. Updated discharge summary.  Time spent-15 minutes  Gerlean Ren MD [facility]

## 2022-01-27 DIAGNOSIS — D508 Other iron deficiency anemias: Secondary | ICD-10-CM | POA: Diagnosis not present

## 2022-01-27 DIAGNOSIS — G2581 Restless legs syndrome: Secondary | ICD-10-CM | POA: Diagnosis not present

## 2022-01-27 DIAGNOSIS — I10 Essential (primary) hypertension: Secondary | ICD-10-CM | POA: Diagnosis not present

## 2022-01-27 DIAGNOSIS — E871 Hypo-osmolality and hyponatremia: Secondary | ICD-10-CM | POA: Diagnosis not present

## 2022-01-27 DIAGNOSIS — G4700 Insomnia, unspecified: Secondary | ICD-10-CM | POA: Diagnosis not present

## 2022-01-27 DIAGNOSIS — M1990 Unspecified osteoarthritis, unspecified site: Secondary | ICD-10-CM | POA: Diagnosis not present

## 2022-01-27 DIAGNOSIS — G8929 Other chronic pain: Secondary | ICD-10-CM | POA: Diagnosis not present

## 2022-01-27 DIAGNOSIS — J189 Pneumonia, unspecified organism: Secondary | ICD-10-CM | POA: Diagnosis not present

## 2022-01-27 DIAGNOSIS — R5381 Other malaise: Secondary | ICD-10-CM | POA: Diagnosis not present

## 2022-01-27 DIAGNOSIS — M545 Low back pain, unspecified: Secondary | ICD-10-CM | POA: Diagnosis not present

## 2022-01-27 DIAGNOSIS — K5900 Constipation, unspecified: Secondary | ICD-10-CM | POA: Diagnosis not present

## 2022-01-28 DIAGNOSIS — E871 Hypo-osmolality and hyponatremia: Secondary | ICD-10-CM | POA: Diagnosis not present

## 2022-01-28 LAB — CULTURE, BLOOD (ROUTINE X 2)
Culture: NO GROWTH
Culture: NO GROWTH
Special Requests: ADEQUATE
Special Requests: ADEQUATE

## 2022-02-01 DIAGNOSIS — D508 Other iron deficiency anemias: Secondary | ICD-10-CM | POA: Diagnosis not present

## 2022-02-01 DIAGNOSIS — G2581 Restless legs syndrome: Secondary | ICD-10-CM | POA: Diagnosis not present

## 2022-02-01 DIAGNOSIS — G4700 Insomnia, unspecified: Secondary | ICD-10-CM | POA: Diagnosis not present

## 2022-02-01 DIAGNOSIS — E871 Hypo-osmolality and hyponatremia: Secondary | ICD-10-CM | POA: Diagnosis not present

## 2022-02-01 DIAGNOSIS — M1990 Unspecified osteoarthritis, unspecified site: Secondary | ICD-10-CM | POA: Diagnosis not present

## 2022-02-01 DIAGNOSIS — G8929 Other chronic pain: Secondary | ICD-10-CM | POA: Diagnosis not present

## 2022-02-01 DIAGNOSIS — I10 Essential (primary) hypertension: Secondary | ICD-10-CM | POA: Diagnosis not present

## 2022-02-01 DIAGNOSIS — M545 Low back pain, unspecified: Secondary | ICD-10-CM | POA: Diagnosis not present

## 2022-02-01 DIAGNOSIS — R1310 Dysphagia, unspecified: Secondary | ICD-10-CM | POA: Diagnosis not present

## 2022-02-01 DIAGNOSIS — K5900 Constipation, unspecified: Secondary | ICD-10-CM | POA: Diagnosis not present

## 2022-02-04 ENCOUNTER — Other Ambulatory Visit (HOSPITAL_COMMUNITY): Payer: Self-pay

## 2022-02-04 DIAGNOSIS — R1310 Dysphagia, unspecified: Secondary | ICD-10-CM

## 2022-02-04 DIAGNOSIS — R059 Cough, unspecified: Secondary | ICD-10-CM

## 2022-02-09 DIAGNOSIS — L89150 Pressure ulcer of sacral region, unstageable: Secondary | ICD-10-CM | POA: Diagnosis not present

## 2022-02-10 DIAGNOSIS — E871 Hypo-osmolality and hyponatremia: Secondary | ICD-10-CM | POA: Diagnosis not present

## 2022-02-11 DIAGNOSIS — J189 Pneumonia, unspecified organism: Secondary | ICD-10-CM | POA: Diagnosis not present

## 2022-02-11 DIAGNOSIS — I10 Essential (primary) hypertension: Secondary | ICD-10-CM | POA: Diagnosis not present

## 2022-02-11 DIAGNOSIS — R5381 Other malaise: Secondary | ICD-10-CM | POA: Diagnosis not present

## 2022-02-11 DIAGNOSIS — E871 Hypo-osmolality and hyponatremia: Secondary | ICD-10-CM | POA: Diagnosis not present

## 2022-02-12 DIAGNOSIS — I10 Essential (primary) hypertension: Secondary | ICD-10-CM | POA: Diagnosis not present

## 2022-02-12 DIAGNOSIS — E871 Hypo-osmolality and hyponatremia: Secondary | ICD-10-CM | POA: Diagnosis not present

## 2022-02-15 DIAGNOSIS — R41841 Cognitive communication deficit: Secondary | ICD-10-CM | POA: Diagnosis not present

## 2022-02-15 DIAGNOSIS — R1312 Dysphagia, oropharyngeal phase: Secondary | ICD-10-CM | POA: Diagnosis not present

## 2022-02-15 DIAGNOSIS — Z7409 Other reduced mobility: Secondary | ICD-10-CM | POA: Diagnosis not present

## 2022-02-15 DIAGNOSIS — Z741 Need for assistance with personal care: Secondary | ICD-10-CM | POA: Diagnosis not present

## 2022-02-16 DIAGNOSIS — R1312 Dysphagia, oropharyngeal phase: Secondary | ICD-10-CM | POA: Diagnosis not present

## 2022-02-16 DIAGNOSIS — R41841 Cognitive communication deficit: Secondary | ICD-10-CM | POA: Diagnosis not present

## 2022-02-16 DIAGNOSIS — Z741 Need for assistance with personal care: Secondary | ICD-10-CM | POA: Diagnosis not present

## 2022-02-16 DIAGNOSIS — Z7409 Other reduced mobility: Secondary | ICD-10-CM | POA: Diagnosis not present

## 2022-02-17 DIAGNOSIS — R41841 Cognitive communication deficit: Secondary | ICD-10-CM | POA: Diagnosis not present

## 2022-02-17 DIAGNOSIS — Z741 Need for assistance with personal care: Secondary | ICD-10-CM | POA: Diagnosis not present

## 2022-02-17 DIAGNOSIS — R1312 Dysphagia, oropharyngeal phase: Secondary | ICD-10-CM | POA: Diagnosis not present

## 2022-02-17 DIAGNOSIS — Z7409 Other reduced mobility: Secondary | ICD-10-CM | POA: Diagnosis not present

## 2022-02-18 DIAGNOSIS — I10 Essential (primary) hypertension: Secondary | ICD-10-CM | POA: Diagnosis not present

## 2022-02-18 DIAGNOSIS — Z7409 Other reduced mobility: Secondary | ICD-10-CM | POA: Diagnosis not present

## 2022-02-18 DIAGNOSIS — Z741 Need for assistance with personal care: Secondary | ICD-10-CM | POA: Diagnosis not present

## 2022-02-18 DIAGNOSIS — M1990 Unspecified osteoarthritis, unspecified site: Secondary | ICD-10-CM | POA: Diagnosis not present

## 2022-02-18 DIAGNOSIS — R41841 Cognitive communication deficit: Secondary | ICD-10-CM | POA: Diagnosis not present

## 2022-02-18 DIAGNOSIS — R1312 Dysphagia, oropharyngeal phase: Secondary | ICD-10-CM | POA: Diagnosis not present

## 2022-02-19 DIAGNOSIS — R41841 Cognitive communication deficit: Secondary | ICD-10-CM | POA: Diagnosis not present

## 2022-02-19 DIAGNOSIS — R1312 Dysphagia, oropharyngeal phase: Secondary | ICD-10-CM | POA: Diagnosis not present

## 2022-02-19 DIAGNOSIS — Z741 Need for assistance with personal care: Secondary | ICD-10-CM | POA: Diagnosis not present

## 2022-02-19 DIAGNOSIS — Z7409 Other reduced mobility: Secondary | ICD-10-CM | POA: Diagnosis not present

## 2022-02-22 DIAGNOSIS — R1312 Dysphagia, oropharyngeal phase: Secondary | ICD-10-CM | POA: Diagnosis not present

## 2022-02-22 DIAGNOSIS — Z7409 Other reduced mobility: Secondary | ICD-10-CM | POA: Diagnosis not present

## 2022-02-22 DIAGNOSIS — Z741 Need for assistance with personal care: Secondary | ICD-10-CM | POA: Diagnosis not present

## 2022-02-22 DIAGNOSIS — R41841 Cognitive communication deficit: Secondary | ICD-10-CM | POA: Diagnosis not present

## 2022-02-23 DIAGNOSIS — Z741 Need for assistance with personal care: Secondary | ICD-10-CM | POA: Diagnosis not present

## 2022-02-23 DIAGNOSIS — R1312 Dysphagia, oropharyngeal phase: Secondary | ICD-10-CM | POA: Diagnosis not present

## 2022-02-23 DIAGNOSIS — L89150 Pressure ulcer of sacral region, unstageable: Secondary | ICD-10-CM | POA: Diagnosis not present

## 2022-02-23 DIAGNOSIS — Z7409 Other reduced mobility: Secondary | ICD-10-CM | POA: Diagnosis not present

## 2022-02-23 DIAGNOSIS — R41841 Cognitive communication deficit: Secondary | ICD-10-CM | POA: Diagnosis not present

## 2022-02-24 DIAGNOSIS — E871 Hypo-osmolality and hyponatremia: Secondary | ICD-10-CM | POA: Diagnosis not present

## 2022-02-24 DIAGNOSIS — Z741 Need for assistance with personal care: Secondary | ICD-10-CM | POA: Diagnosis not present

## 2022-02-24 DIAGNOSIS — R41841 Cognitive communication deficit: Secondary | ICD-10-CM | POA: Diagnosis not present

## 2022-02-24 DIAGNOSIS — Z7409 Other reduced mobility: Secondary | ICD-10-CM | POA: Diagnosis not present

## 2022-02-24 DIAGNOSIS — R1312 Dysphagia, oropharyngeal phase: Secondary | ICD-10-CM | POA: Diagnosis not present

## 2022-02-25 ENCOUNTER — Ambulatory Visit (HOSPITAL_COMMUNITY)
Admission: RE | Admit: 2022-02-25 | Discharge: 2022-02-25 | Disposition: A | Discharge status: Home / Self Care | Payer: Medicare Other | Source: Ambulatory Visit | Attending: Family Medicine | Admitting: Family Medicine

## 2022-02-25 DIAGNOSIS — Z741 Need for assistance with personal care: Secondary | ICD-10-CM | POA: Diagnosis not present

## 2022-02-25 DIAGNOSIS — R1310 Dysphagia, unspecified: Secondary | ICD-10-CM | POA: Diagnosis not present

## 2022-02-25 DIAGNOSIS — R059 Cough, unspecified: Secondary | ICD-10-CM | POA: Insufficient documentation

## 2022-02-25 DIAGNOSIS — Z7409 Other reduced mobility: Secondary | ICD-10-CM | POA: Diagnosis not present

## 2022-02-25 DIAGNOSIS — R41841 Cognitive communication deficit: Secondary | ICD-10-CM | POA: Diagnosis not present

## 2022-02-25 DIAGNOSIS — R1312 Dysphagia, oropharyngeal phase: Secondary | ICD-10-CM | POA: Diagnosis not present

## 2022-02-26 DIAGNOSIS — R41841 Cognitive communication deficit: Secondary | ICD-10-CM | POA: Diagnosis not present

## 2022-02-26 DIAGNOSIS — Z741 Need for assistance with personal care: Secondary | ICD-10-CM | POA: Diagnosis not present

## 2022-02-26 DIAGNOSIS — Z7409 Other reduced mobility: Secondary | ICD-10-CM | POA: Diagnosis not present

## 2022-02-26 DIAGNOSIS — R1312 Dysphagia, oropharyngeal phase: Secondary | ICD-10-CM | POA: Diagnosis not present

## 2022-02-27 DIAGNOSIS — Z7409 Other reduced mobility: Secondary | ICD-10-CM | POA: Diagnosis not present

## 2022-02-27 DIAGNOSIS — R41841 Cognitive communication deficit: Secondary | ICD-10-CM | POA: Diagnosis not present

## 2022-02-27 DIAGNOSIS — R1312 Dysphagia, oropharyngeal phase: Secondary | ICD-10-CM | POA: Diagnosis not present

## 2022-02-27 DIAGNOSIS — Z741 Need for assistance with personal care: Secondary | ICD-10-CM | POA: Diagnosis not present

## 2022-03-01 DIAGNOSIS — R1310 Dysphagia, unspecified: Secondary | ICD-10-CM | POA: Diagnosis not present

## 2022-03-01 DIAGNOSIS — R1312 Dysphagia, oropharyngeal phase: Secondary | ICD-10-CM | POA: Diagnosis not present

## 2022-03-01 DIAGNOSIS — G4700 Insomnia, unspecified: Secondary | ICD-10-CM | POA: Diagnosis not present

## 2022-03-01 DIAGNOSIS — G2581 Restless legs syndrome: Secondary | ICD-10-CM | POA: Diagnosis not present

## 2022-03-01 DIAGNOSIS — R41841 Cognitive communication deficit: Secondary | ICD-10-CM | POA: Diagnosis not present

## 2022-03-01 DIAGNOSIS — D508 Other iron deficiency anemias: Secondary | ICD-10-CM | POA: Diagnosis not present

## 2022-03-01 DIAGNOSIS — Z7409 Other reduced mobility: Secondary | ICD-10-CM | POA: Diagnosis not present

## 2022-03-01 DIAGNOSIS — I10 Essential (primary) hypertension: Secondary | ICD-10-CM | POA: Diagnosis not present

## 2022-03-01 DIAGNOSIS — Z741 Need for assistance with personal care: Secondary | ICD-10-CM | POA: Diagnosis not present

## 2022-03-01 DIAGNOSIS — E871 Hypo-osmolality and hyponatremia: Secondary | ICD-10-CM | POA: Diagnosis not present

## 2022-03-01 DIAGNOSIS — M545 Low back pain, unspecified: Secondary | ICD-10-CM | POA: Diagnosis not present

## 2022-03-01 DIAGNOSIS — K5900 Constipation, unspecified: Secondary | ICD-10-CM | POA: Diagnosis not present

## 2022-03-01 DIAGNOSIS — G8929 Other chronic pain: Secondary | ICD-10-CM | POA: Diagnosis not present

## 2022-03-01 DIAGNOSIS — M1990 Unspecified osteoarthritis, unspecified site: Secondary | ICD-10-CM | POA: Diagnosis not present

## 2022-03-02 DIAGNOSIS — Z741 Need for assistance with personal care: Secondary | ICD-10-CM | POA: Diagnosis not present

## 2022-03-02 DIAGNOSIS — R1312 Dysphagia, oropharyngeal phase: Secondary | ICD-10-CM | POA: Diagnosis not present

## 2022-03-02 DIAGNOSIS — L89150 Pressure ulcer of sacral region, unstageable: Secondary | ICD-10-CM | POA: Diagnosis not present

## 2022-03-02 DIAGNOSIS — Z7409 Other reduced mobility: Secondary | ICD-10-CM | POA: Diagnosis not present

## 2022-03-02 DIAGNOSIS — R41841 Cognitive communication deficit: Secondary | ICD-10-CM | POA: Diagnosis not present

## 2022-03-03 DIAGNOSIS — R1312 Dysphagia, oropharyngeal phase: Secondary | ICD-10-CM | POA: Diagnosis not present

## 2022-03-03 DIAGNOSIS — Z7409 Other reduced mobility: Secondary | ICD-10-CM | POA: Diagnosis not present

## 2022-03-03 DIAGNOSIS — R41841 Cognitive communication deficit: Secondary | ICD-10-CM | POA: Diagnosis not present

## 2022-03-03 DIAGNOSIS — I10 Essential (primary) hypertension: Secondary | ICD-10-CM | POA: Diagnosis not present

## 2022-03-03 DIAGNOSIS — Z741 Need for assistance with personal care: Secondary | ICD-10-CM | POA: Diagnosis not present

## 2022-03-04 DIAGNOSIS — Z741 Need for assistance with personal care: Secondary | ICD-10-CM | POA: Diagnosis not present

## 2022-03-04 DIAGNOSIS — R1312 Dysphagia, oropharyngeal phase: Secondary | ICD-10-CM | POA: Diagnosis not present

## 2022-03-04 DIAGNOSIS — R41841 Cognitive communication deficit: Secondary | ICD-10-CM | POA: Diagnosis not present

## 2022-03-04 DIAGNOSIS — Z7409 Other reduced mobility: Secondary | ICD-10-CM | POA: Diagnosis not present

## 2022-03-05 DIAGNOSIS — Z7409 Other reduced mobility: Secondary | ICD-10-CM | POA: Diagnosis not present

## 2022-03-05 DIAGNOSIS — R41841 Cognitive communication deficit: Secondary | ICD-10-CM | POA: Diagnosis not present

## 2022-03-05 DIAGNOSIS — Z741 Need for assistance with personal care: Secondary | ICD-10-CM | POA: Diagnosis not present

## 2022-03-05 DIAGNOSIS — R1312 Dysphagia, oropharyngeal phase: Secondary | ICD-10-CM | POA: Diagnosis not present

## 2022-03-08 DIAGNOSIS — G2581 Restless legs syndrome: Secondary | ICD-10-CM | POA: Diagnosis not present

## 2022-03-08 DIAGNOSIS — Z741 Need for assistance with personal care: Secondary | ICD-10-CM | POA: Diagnosis not present

## 2022-03-08 DIAGNOSIS — E871 Hypo-osmolality and hyponatremia: Secondary | ICD-10-CM | POA: Diagnosis not present

## 2022-03-08 DIAGNOSIS — G8929 Other chronic pain: Secondary | ICD-10-CM | POA: Diagnosis not present

## 2022-03-08 DIAGNOSIS — R1310 Dysphagia, unspecified: Secondary | ICD-10-CM | POA: Diagnosis not present

## 2022-03-08 DIAGNOSIS — R41841 Cognitive communication deficit: Secondary | ICD-10-CM | POA: Diagnosis not present

## 2022-03-08 DIAGNOSIS — Z7409 Other reduced mobility: Secondary | ICD-10-CM | POA: Diagnosis not present

## 2022-03-08 DIAGNOSIS — I10 Essential (primary) hypertension: Secondary | ICD-10-CM | POA: Diagnosis not present

## 2022-03-08 DIAGNOSIS — M545 Low back pain, unspecified: Secondary | ICD-10-CM | POA: Diagnosis not present

## 2022-03-08 DIAGNOSIS — R1312 Dysphagia, oropharyngeal phase: Secondary | ICD-10-CM | POA: Diagnosis not present

## 2022-03-08 DIAGNOSIS — H109 Unspecified conjunctivitis: Secondary | ICD-10-CM | POA: Diagnosis not present

## 2022-03-09 DIAGNOSIS — Z7409 Other reduced mobility: Secondary | ICD-10-CM | POA: Diagnosis not present

## 2022-03-09 DIAGNOSIS — Z741 Need for assistance with personal care: Secondary | ICD-10-CM | POA: Diagnosis not present

## 2022-03-09 DIAGNOSIS — R41841 Cognitive communication deficit: Secondary | ICD-10-CM | POA: Diagnosis not present

## 2022-03-09 DIAGNOSIS — L89150 Pressure ulcer of sacral region, unstageable: Secondary | ICD-10-CM | POA: Diagnosis not present

## 2022-03-09 DIAGNOSIS — R1312 Dysphagia, oropharyngeal phase: Secondary | ICD-10-CM | POA: Diagnosis not present

## 2022-03-11 DIAGNOSIS — R41841 Cognitive communication deficit: Secondary | ICD-10-CM | POA: Diagnosis not present

## 2022-03-11 DIAGNOSIS — Z741 Need for assistance with personal care: Secondary | ICD-10-CM | POA: Diagnosis not present

## 2022-03-11 DIAGNOSIS — R1312 Dysphagia, oropharyngeal phase: Secondary | ICD-10-CM | POA: Diagnosis not present

## 2022-03-11 DIAGNOSIS — Z7409 Other reduced mobility: Secondary | ICD-10-CM | POA: Diagnosis not present

## 2022-03-12 DIAGNOSIS — R1312 Dysphagia, oropharyngeal phase: Secondary | ICD-10-CM | POA: Diagnosis not present

## 2022-03-12 DIAGNOSIS — Z741 Need for assistance with personal care: Secondary | ICD-10-CM | POA: Diagnosis not present

## 2022-03-12 DIAGNOSIS — R41841 Cognitive communication deficit: Secondary | ICD-10-CM | POA: Diagnosis not present

## 2022-03-12 DIAGNOSIS — Z7409 Other reduced mobility: Secondary | ICD-10-CM | POA: Diagnosis not present

## 2022-03-15 DIAGNOSIS — R059 Cough, unspecified: Secondary | ICD-10-CM | POA: Diagnosis not present

## 2022-03-15 DIAGNOSIS — R638 Other symptoms and signs concerning food and fluid intake: Secondary | ICD-10-CM | POA: Diagnosis not present

## 2022-03-15 DIAGNOSIS — G2581 Restless legs syndrome: Secondary | ICD-10-CM | POA: Diagnosis not present

## 2022-03-15 DIAGNOSIS — K5900 Constipation, unspecified: Secondary | ICD-10-CM | POA: Diagnosis not present

## 2022-03-15 DIAGNOSIS — M1990 Unspecified osteoarthritis, unspecified site: Secondary | ICD-10-CM | POA: Diagnosis not present

## 2022-03-15 DIAGNOSIS — G8929 Other chronic pain: Secondary | ICD-10-CM | POA: Diagnosis not present

## 2022-03-15 DIAGNOSIS — G4700 Insomnia, unspecified: Secondary | ICD-10-CM | POA: Diagnosis not present

## 2022-03-15 DIAGNOSIS — R1310 Dysphagia, unspecified: Secondary | ICD-10-CM | POA: Diagnosis not present

## 2022-03-15 DIAGNOSIS — M545 Low back pain, unspecified: Secondary | ICD-10-CM | POA: Diagnosis not present

## 2022-03-15 DIAGNOSIS — E871 Hypo-osmolality and hyponatremia: Secondary | ICD-10-CM | POA: Diagnosis not present

## 2022-03-15 DIAGNOSIS — I10 Essential (primary) hypertension: Secondary | ICD-10-CM | POA: Diagnosis not present

## 2022-03-16 DIAGNOSIS — R509 Fever, unspecified: Secondary | ICD-10-CM | POA: Diagnosis not present

## 2022-03-16 DIAGNOSIS — L89150 Pressure ulcer of sacral region, unstageable: Secondary | ICD-10-CM | POA: Diagnosis not present

## 2022-03-16 DIAGNOSIS — R059 Cough, unspecified: Secondary | ICD-10-CM | POA: Diagnosis not present

## 2022-03-17 DIAGNOSIS — Z7409 Other reduced mobility: Secondary | ICD-10-CM | POA: Diagnosis not present

## 2022-03-17 DIAGNOSIS — R41841 Cognitive communication deficit: Secondary | ICD-10-CM | POA: Diagnosis not present

## 2022-03-17 DIAGNOSIS — Z741 Need for assistance with personal care: Secondary | ICD-10-CM | POA: Diagnosis not present

## 2022-03-17 DIAGNOSIS — R1312 Dysphagia, oropharyngeal phase: Secondary | ICD-10-CM | POA: Diagnosis not present

## 2022-03-22 DIAGNOSIS — M6281 Muscle weakness (generalized): Secondary | ICD-10-CM | POA: Diagnosis not present

## 2022-03-22 DIAGNOSIS — Z7409 Other reduced mobility: Secondary | ICD-10-CM | POA: Diagnosis not present

## 2022-03-22 DIAGNOSIS — Z741 Need for assistance with personal care: Secondary | ICD-10-CM | POA: Diagnosis not present

## 2022-03-23 DIAGNOSIS — L89150 Pressure ulcer of sacral region, unstageable: Secondary | ICD-10-CM | POA: Diagnosis not present

## 2022-03-23 DIAGNOSIS — J918 Pleural effusion in other conditions classified elsewhere: Secondary | ICD-10-CM | POA: Diagnosis not present

## 2022-03-24 DIAGNOSIS — K5900 Constipation, unspecified: Secondary | ICD-10-CM | POA: Diagnosis not present

## 2022-03-24 DIAGNOSIS — G4700 Insomnia, unspecified: Secondary | ICD-10-CM | POA: Diagnosis not present

## 2022-03-24 DIAGNOSIS — G2581 Restless legs syndrome: Secondary | ICD-10-CM | POA: Diagnosis not present

## 2022-03-24 DIAGNOSIS — E871 Hypo-osmolality and hyponatremia: Secondary | ICD-10-CM | POA: Diagnosis not present

## 2022-03-24 DIAGNOSIS — M545 Low back pain, unspecified: Secondary | ICD-10-CM | POA: Diagnosis not present

## 2022-03-24 DIAGNOSIS — R638 Other symptoms and signs concerning food and fluid intake: Secondary | ICD-10-CM | POA: Diagnosis not present

## 2022-03-24 DIAGNOSIS — G8929 Other chronic pain: Secondary | ICD-10-CM | POA: Diagnosis not present

## 2022-03-24 DIAGNOSIS — R1310 Dysphagia, unspecified: Secondary | ICD-10-CM | POA: Diagnosis not present

## 2022-03-24 DIAGNOSIS — D508 Other iron deficiency anemias: Secondary | ICD-10-CM | POA: Diagnosis not present

## 2022-03-30 DIAGNOSIS — L89150 Pressure ulcer of sacral region, unstageable: Secondary | ICD-10-CM | POA: Diagnosis not present

## 2022-03-30 DIAGNOSIS — M1990 Unspecified osteoarthritis, unspecified site: Secondary | ICD-10-CM | POA: Diagnosis not present

## 2022-03-30 DIAGNOSIS — I10 Essential (primary) hypertension: Secondary | ICD-10-CM | POA: Diagnosis not present

## 2022-03-30 DIAGNOSIS — J811 Chronic pulmonary edema: Secondary | ICD-10-CM | POA: Diagnosis not present

## 2022-03-31 DIAGNOSIS — J189 Pneumonia, unspecified organism: Secondary | ICD-10-CM | POA: Diagnosis not present

## 2022-03-31 DIAGNOSIS — D649 Anemia, unspecified: Secondary | ICD-10-CM | POA: Diagnosis not present

## 2022-03-31 DIAGNOSIS — G8929 Other chronic pain: Secondary | ICD-10-CM | POA: Diagnosis not present

## 2022-03-31 DIAGNOSIS — D508 Other iron deficiency anemias: Secondary | ICD-10-CM | POA: Diagnosis not present

## 2022-03-31 DIAGNOSIS — K5900 Constipation, unspecified: Secondary | ICD-10-CM | POA: Diagnosis not present

## 2022-03-31 DIAGNOSIS — M25532 Pain in left wrist: Secondary | ICD-10-CM | POA: Diagnosis not present

## 2022-03-31 DIAGNOSIS — M545 Low back pain, unspecified: Secondary | ICD-10-CM | POA: Diagnosis not present

## 2022-03-31 DIAGNOSIS — M25432 Effusion, left wrist: Secondary | ICD-10-CM | POA: Diagnosis not present

## 2022-03-31 DIAGNOSIS — M1990 Unspecified osteoarthritis, unspecified site: Secondary | ICD-10-CM | POA: Diagnosis not present

## 2022-03-31 DIAGNOSIS — G2581 Restless legs syndrome: Secondary | ICD-10-CM | POA: Diagnosis not present

## 2022-03-31 DIAGNOSIS — G4700 Insomnia, unspecified: Secondary | ICD-10-CM | POA: Diagnosis not present

## 2022-04-01 DIAGNOSIS — I10 Essential (primary) hypertension: Secondary | ICD-10-CM | POA: Diagnosis not present

## 2022-04-06 DIAGNOSIS — L89153 Pressure ulcer of sacral region, stage 3: Secondary | ICD-10-CM | POA: Diagnosis not present

## 2022-04-13 DIAGNOSIS — L89153 Pressure ulcer of sacral region, stage 3: Secondary | ICD-10-CM | POA: Diagnosis not present

## 2022-04-20 DIAGNOSIS — L89153 Pressure ulcer of sacral region, stage 3: Secondary | ICD-10-CM | POA: Diagnosis not present

## 2022-04-20 DIAGNOSIS — D649 Anemia, unspecified: Secondary | ICD-10-CM | POA: Diagnosis not present

## 2022-04-20 DIAGNOSIS — I10 Essential (primary) hypertension: Secondary | ICD-10-CM | POA: Diagnosis not present

## 2022-04-27 DIAGNOSIS — R634 Abnormal weight loss: Secondary | ICD-10-CM | POA: Diagnosis not present

## 2022-04-27 DIAGNOSIS — E871 Hypo-osmolality and hyponatremia: Secondary | ICD-10-CM | POA: Diagnosis not present

## 2022-04-27 DIAGNOSIS — L89153 Pressure ulcer of sacral region, stage 3: Secondary | ICD-10-CM | POA: Diagnosis not present

## 2022-04-27 DIAGNOSIS — G4700 Insomnia, unspecified: Secondary | ICD-10-CM | POA: Diagnosis not present

## 2022-04-27 DIAGNOSIS — I10 Essential (primary) hypertension: Secondary | ICD-10-CM | POA: Diagnosis not present

## 2022-04-27 DIAGNOSIS — K5900 Constipation, unspecified: Secondary | ICD-10-CM | POA: Diagnosis not present

## 2022-04-27 DIAGNOSIS — M1990 Unspecified osteoarthritis, unspecified site: Secondary | ICD-10-CM | POA: Diagnosis not present

## 2022-04-27 DIAGNOSIS — D508 Other iron deficiency anemias: Secondary | ICD-10-CM | POA: Diagnosis not present

## 2022-04-27 DIAGNOSIS — R1310 Dysphagia, unspecified: Secondary | ICD-10-CM | POA: Diagnosis not present

## 2022-04-28 DIAGNOSIS — G8929 Other chronic pain: Secondary | ICD-10-CM | POA: Diagnosis not present

## 2022-04-28 DIAGNOSIS — M1990 Unspecified osteoarthritis, unspecified site: Secondary | ICD-10-CM | POA: Diagnosis not present

## 2022-04-28 DIAGNOSIS — R634 Abnormal weight loss: Secondary | ICD-10-CM | POA: Diagnosis not present

## 2022-04-28 DIAGNOSIS — D508 Other iron deficiency anemias: Secondary | ICD-10-CM | POA: Diagnosis not present

## 2022-04-28 DIAGNOSIS — M545 Low back pain, unspecified: Secondary | ICD-10-CM | POA: Diagnosis not present

## 2022-04-28 DIAGNOSIS — I10 Essential (primary) hypertension: Secondary | ICD-10-CM | POA: Diagnosis not present

## 2022-05-04 DIAGNOSIS — L89153 Pressure ulcer of sacral region, stage 3: Secondary | ICD-10-CM | POA: Diagnosis not present

## 2022-05-05 DIAGNOSIS — M1990 Unspecified osteoarthritis, unspecified site: Secondary | ICD-10-CM | POA: Diagnosis not present

## 2022-05-05 DIAGNOSIS — G2581 Restless legs syndrome: Secondary | ICD-10-CM | POA: Diagnosis not present

## 2022-05-05 DIAGNOSIS — E871 Hypo-osmolality and hyponatremia: Secondary | ICD-10-CM | POA: Diagnosis not present

## 2022-05-05 DIAGNOSIS — G8929 Other chronic pain: Secondary | ICD-10-CM | POA: Diagnosis not present

## 2022-05-05 DIAGNOSIS — D508 Other iron deficiency anemias: Secondary | ICD-10-CM | POA: Diagnosis not present

## 2022-05-05 DIAGNOSIS — K5900 Constipation, unspecified: Secondary | ICD-10-CM | POA: Diagnosis not present

## 2022-05-05 DIAGNOSIS — R634 Abnormal weight loss: Secondary | ICD-10-CM | POA: Diagnosis not present

## 2022-05-05 DIAGNOSIS — E507 Other ocular manifestations of vitamin A deficiency: Secondary | ICD-10-CM | POA: Diagnosis not present

## 2022-05-05 DIAGNOSIS — G4700 Insomnia, unspecified: Secondary | ICD-10-CM | POA: Diagnosis not present

## 2022-05-05 DIAGNOSIS — M545 Low back pain, unspecified: Secondary | ICD-10-CM | POA: Diagnosis not present

## 2022-05-05 DIAGNOSIS — R1310 Dysphagia, unspecified: Secondary | ICD-10-CM | POA: Diagnosis not present

## 2022-05-10 DIAGNOSIS — R634 Abnormal weight loss: Secondary | ICD-10-CM | POA: Diagnosis not present

## 2022-05-10 DIAGNOSIS — R059 Cough, unspecified: Secondary | ICD-10-CM | POA: Diagnosis not present

## 2022-05-10 DIAGNOSIS — E507 Other ocular manifestations of vitamin A deficiency: Secondary | ICD-10-CM | POA: Diagnosis not present

## 2022-05-10 DIAGNOSIS — K5900 Constipation, unspecified: Secondary | ICD-10-CM | POA: Diagnosis not present

## 2022-05-10 DIAGNOSIS — D508 Other iron deficiency anemias: Secondary | ICD-10-CM | POA: Diagnosis not present

## 2022-05-10 DIAGNOSIS — J22 Unspecified acute lower respiratory infection: Secondary | ICD-10-CM | POA: Diagnosis not present

## 2022-05-10 DIAGNOSIS — I10 Essential (primary) hypertension: Secondary | ICD-10-CM | POA: Diagnosis not present

## 2022-05-10 DIAGNOSIS — G4700 Insomnia, unspecified: Secondary | ICD-10-CM | POA: Diagnosis not present

## 2022-05-10 DIAGNOSIS — M1990 Unspecified osteoarthritis, unspecified site: Secondary | ICD-10-CM | POA: Diagnosis not present

## 2022-05-10 DIAGNOSIS — J9601 Acute respiratory failure with hypoxia: Secondary | ICD-10-CM | POA: Diagnosis not present

## 2022-05-10 DIAGNOSIS — M545 Low back pain, unspecified: Secondary | ICD-10-CM | POA: Diagnosis not present

## 2022-05-11 DIAGNOSIS — I10 Essential (primary) hypertension: Secondary | ICD-10-CM | POA: Diagnosis not present

## 2022-05-11 DIAGNOSIS — L89153 Pressure ulcer of sacral region, stage 3: Secondary | ICD-10-CM | POA: Diagnosis not present

## 2022-05-11 DIAGNOSIS — J189 Pneumonia, unspecified organism: Secondary | ICD-10-CM | POA: Diagnosis not present

## 2022-05-12 ENCOUNTER — Emergency Department (HOSPITAL_COMMUNITY): Payer: Medicare Other

## 2022-05-12 ENCOUNTER — Inpatient Hospital Stay (HOSPITAL_COMMUNITY)
Admission: EM | Admit: 2022-05-12 | Discharge: 2022-05-14 | DRG: 177 | Disposition: A | Discharge status: Skilled Nursing Facility | Payer: Medicare Other | Source: Skilled Nursing Facility | Attending: Internal Medicine | Admitting: Internal Medicine

## 2022-05-12 ENCOUNTER — Observation Stay (HOSPITAL_COMMUNITY): Payer: Medicare Other

## 2022-05-12 ENCOUNTER — Other Ambulatory Visit: Payer: Self-pay

## 2022-05-12 DIAGNOSIS — R4182 Altered mental status, unspecified: Secondary | ICD-10-CM | POA: Diagnosis not present

## 2022-05-12 DIAGNOSIS — Z79899 Other long term (current) drug therapy: Secondary | ICD-10-CM

## 2022-05-12 DIAGNOSIS — G8929 Other chronic pain: Secondary | ICD-10-CM | POA: Diagnosis present

## 2022-05-12 DIAGNOSIS — Z66 Do not resuscitate: Secondary | ICD-10-CM

## 2022-05-12 DIAGNOSIS — R54 Age-related physical debility: Secondary | ICD-10-CM | POA: Diagnosis present

## 2022-05-12 DIAGNOSIS — G2581 Restless legs syndrome: Secondary | ICD-10-CM | POA: Diagnosis present

## 2022-05-12 DIAGNOSIS — R4589 Other symptoms and signs involving emotional state: Secondary | ICD-10-CM

## 2022-05-12 DIAGNOSIS — Z7189 Other specified counseling: Secondary | ICD-10-CM

## 2022-05-12 DIAGNOSIS — R52 Pain, unspecified: Secondary | ICD-10-CM | POA: Diagnosis not present

## 2022-05-12 DIAGNOSIS — J9600 Acute respiratory failure, unspecified whether with hypoxia or hypercapnia: Secondary | ICD-10-CM | POA: Diagnosis not present

## 2022-05-12 DIAGNOSIS — Z833 Family history of diabetes mellitus: Secondary | ICD-10-CM | POA: Diagnosis not present

## 2022-05-12 DIAGNOSIS — Z6831 Body mass index (BMI) 31.0-31.9, adult: Secondary | ICD-10-CM

## 2022-05-12 DIAGNOSIS — I10 Essential (primary) hypertension: Secondary | ICD-10-CM | POA: Diagnosis not present

## 2022-05-12 DIAGNOSIS — Z515 Encounter for palliative care: Secondary | ICD-10-CM | POA: Diagnosis not present

## 2022-05-12 DIAGNOSIS — J690 Pneumonitis due to inhalation of food and vomit: Principal | ICD-10-CM | POA: Insufficient documentation

## 2022-05-12 DIAGNOSIS — J9811 Atelectasis: Secondary | ICD-10-CM | POA: Diagnosis not present

## 2022-05-12 DIAGNOSIS — F419 Anxiety disorder, unspecified: Secondary | ICD-10-CM | POA: Diagnosis present

## 2022-05-12 DIAGNOSIS — D508 Other iron deficiency anemias: Secondary | ICD-10-CM | POA: Diagnosis not present

## 2022-05-12 DIAGNOSIS — Z7401 Bed confinement status: Secondary | ICD-10-CM | POA: Diagnosis not present

## 2022-05-12 DIAGNOSIS — D72819 Decreased white blood cell count, unspecified: Secondary | ICD-10-CM | POA: Diagnosis present

## 2022-05-12 DIAGNOSIS — J9601 Acute respiratory failure with hypoxia: Secondary | ICD-10-CM | POA: Diagnosis present

## 2022-05-12 DIAGNOSIS — Z90710 Acquired absence of both cervix and uterus: Secondary | ICD-10-CM

## 2022-05-12 DIAGNOSIS — Z83438 Family history of other disorder of lipoprotein metabolism and other lipidemia: Secondary | ICD-10-CM

## 2022-05-12 DIAGNOSIS — J1001 Influenza due to other identified influenza virus with the same other identified influenza virus pneumonia: Secondary | ICD-10-CM | POA: Diagnosis present

## 2022-05-12 DIAGNOSIS — Z7901 Long term (current) use of anticoagulants: Secondary | ICD-10-CM

## 2022-05-12 DIAGNOSIS — L89159 Pressure ulcer of sacral region, unspecified stage: Secondary | ICD-10-CM

## 2022-05-12 DIAGNOSIS — G4700 Insomnia, unspecified: Secondary | ICD-10-CM | POA: Diagnosis not present

## 2022-05-12 DIAGNOSIS — Z86711 Personal history of pulmonary embolism: Secondary | ICD-10-CM | POA: Diagnosis present

## 2022-05-12 DIAGNOSIS — K449 Diaphragmatic hernia without obstruction or gangrene: Secondary | ICD-10-CM | POA: Diagnosis present

## 2022-05-12 DIAGNOSIS — R627 Adult failure to thrive: Secondary | ICD-10-CM | POA: Diagnosis not present

## 2022-05-12 DIAGNOSIS — E785 Hyperlipidemia, unspecified: Secondary | ICD-10-CM | POA: Diagnosis present

## 2022-05-12 DIAGNOSIS — H9190 Unspecified hearing loss, unspecified ear: Secondary | ICD-10-CM | POA: Diagnosis present

## 2022-05-12 DIAGNOSIS — E876 Hypokalemia: Secondary | ICD-10-CM

## 2022-05-12 DIAGNOSIS — Z86010 Personal history of colonic polyps: Secondary | ICD-10-CM

## 2022-05-12 DIAGNOSIS — M159 Polyosteoarthritis, unspecified: Secondary | ICD-10-CM | POA: Diagnosis present

## 2022-05-12 DIAGNOSIS — J189 Pneumonia, unspecified organism: Secondary | ICD-10-CM | POA: Diagnosis not present

## 2022-05-12 DIAGNOSIS — L89154 Pressure ulcer of sacral region, stage 4: Secondary | ICD-10-CM | POA: Diagnosis not present

## 2022-05-12 DIAGNOSIS — R0602 Shortness of breath: Secondary | ICD-10-CM | POA: Diagnosis not present

## 2022-05-12 DIAGNOSIS — R1310 Dysphagia, unspecified: Secondary | ICD-10-CM | POA: Diagnosis not present

## 2022-05-12 DIAGNOSIS — M545 Low back pain, unspecified: Secondary | ICD-10-CM | POA: Diagnosis not present

## 2022-05-12 DIAGNOSIS — Z1152 Encounter for screening for COVID-19: Secondary | ICD-10-CM

## 2022-05-12 DIAGNOSIS — Z7952 Long term (current) use of systemic steroids: Secondary | ICD-10-CM

## 2022-05-12 DIAGNOSIS — J101 Influenza due to other identified influenza virus with other respiratory manifestations: Secondary | ICD-10-CM

## 2022-05-12 DIAGNOSIS — K5900 Constipation, unspecified: Secondary | ICD-10-CM | POA: Diagnosis not present

## 2022-05-12 DIAGNOSIS — E43 Unspecified severe protein-calorie malnutrition: Secondary | ICD-10-CM | POA: Diagnosis present

## 2022-05-12 DIAGNOSIS — Z96611 Presence of right artificial shoulder joint: Secondary | ICD-10-CM | POA: Diagnosis present

## 2022-05-12 DIAGNOSIS — K219 Gastro-esophageal reflux disease without esophagitis: Secondary | ICD-10-CM | POA: Diagnosis present

## 2022-05-12 DIAGNOSIS — M1990 Unspecified osteoarthritis, unspecified site: Secondary | ICD-10-CM | POA: Diagnosis not present

## 2022-05-12 DIAGNOSIS — R638 Other symptoms and signs concerning food and fluid intake: Secondary | ICD-10-CM | POA: Diagnosis not present

## 2022-05-12 DIAGNOSIS — R0902 Hypoxemia: Secondary | ICD-10-CM | POA: Diagnosis not present

## 2022-05-12 DIAGNOSIS — S31809A Unspecified open wound of unspecified buttock, initial encounter: Secondary | ICD-10-CM | POA: Diagnosis not present

## 2022-05-12 DIAGNOSIS — E871 Hypo-osmolality and hyponatremia: Secondary | ICD-10-CM | POA: Diagnosis not present

## 2022-05-12 LAB — CBC WITH DIFFERENTIAL/PLATELET
Abs Immature Granulocytes: 0.01 10*3/uL (ref 0.00–0.07)
Basophils Absolute: 0 10*3/uL (ref 0.0–0.1)
Basophils Relative: 0 %
Eosinophils Absolute: 0 10*3/uL (ref 0.0–0.5)
Eosinophils Relative: 0 %
HCT: 39.2 % (ref 36.0–46.0)
Hemoglobin: 12.1 g/dL (ref 12.0–15.0)
Immature Granulocytes: 0 %
Lymphocytes Relative: 37 %
Lymphs Abs: 1.4 10*3/uL (ref 0.7–4.0)
MCH: 25.7 pg — ABNORMAL LOW (ref 26.0–34.0)
MCHC: 30.9 g/dL (ref 30.0–36.0)
MCV: 83.2 fL (ref 80.0–100.0)
Monocytes Absolute: 0.5 10*3/uL (ref 0.1–1.0)
Monocytes Relative: 13 %
Neutro Abs: 1.9 10*3/uL (ref 1.7–7.7)
Neutrophils Relative %: 50 %
Platelets: 328 10*3/uL (ref 150–400)
RBC: 4.71 MIL/uL (ref 3.87–5.11)
RDW: 17.6 % — ABNORMAL HIGH (ref 11.5–15.5)
WBC: 3.9 10*3/uL — ABNORMAL LOW (ref 4.0–10.5)
nRBC: 0 % (ref 0.0–0.2)

## 2022-05-12 LAB — BASIC METABOLIC PANEL
Anion gap: 6 (ref 5–15)
BUN: 17 mg/dL (ref 8–23)
CO2: 26 mmol/L (ref 22–32)
Calcium: 8.3 mg/dL — ABNORMAL LOW (ref 8.9–10.3)
Chloride: 104 mmol/L (ref 98–111)
Creatinine, Ser: 0.44 mg/dL (ref 0.44–1.00)
GFR, Estimated: >60 mL/min (ref >60)
Glucose, Bld: 103 mg/dL — ABNORMAL HIGH (ref 70–99)
Potassium: 3.2 mmol/L — ABNORMAL LOW (ref 3.5–5.1)
Sodium: 136 mmol/L (ref 135–145)

## 2022-05-12 LAB — BRAIN NATRIURETIC PEPTIDE: B Natriuretic Peptide: 66 pg/mL (ref 0.0–100.0)

## 2022-05-12 LAB — TSH: TSH: 2.282 u[IU]/mL (ref 0.350–4.500)

## 2022-05-12 LAB — PROCALCITONIN: Procalcitonin: <0.1 ng/mL

## 2022-05-12 LAB — LACTIC ACID, PLASMA
Lactic Acid, Venous: 1.8 mmol/L (ref 0.5–1.9)
Lactic Acid, Venous: 1.8 mmol/L (ref 0.5–1.9)

## 2022-05-12 LAB — VITAMIN B12: Vitamin B-12: 635 pg/mL (ref 180–914)

## 2022-05-12 LAB — SARS CORONAVIRUS 2 BY RT PCR: SARS Coronavirus 2 by RT PCR: NEGATIVE

## 2022-05-12 LAB — MAGNESIUM: Magnesium: 1.8 mg/dL (ref 1.7–2.4)

## 2022-05-12 MED ORDER — ACETAMINOPHEN 650 MG RE SUPP
650.0000 mg | Freq: Four times a day (QID) | RECTAL | Status: DC | PRN
  Administered 2022-05-13: 650 mg via RECTAL
  Filled 2022-05-12: qty 1

## 2022-05-12 MED ORDER — APIXABAN 5 MG PO TABS
5.0000 mg | ORAL_TABLET | Freq: Two times a day (BID) | ORAL | Status: DC
  Administered 2022-05-13: 5 mg via ORAL
  Filled 2022-05-12 (×2): qty 1

## 2022-05-12 MED ORDER — POTASSIUM CHLORIDE 10 MEQ/100ML IV SOLN
10.0000 meq | INTRAVENOUS | Status: AC
  Administered 2022-05-12 (×3): 10 meq via INTRAVENOUS
  Filled 2022-05-12 (×3): qty 100

## 2022-05-12 MED ORDER — IPRATROPIUM-ALBUTEROL 0.5-2.5 (3) MG/3ML IN SOLN
3.0000 mL | Freq: Four times a day (QID) | RESPIRATORY_TRACT | Status: AC
  Administered 2022-05-12 – 2022-05-13 (×4): 3 mL via RESPIRATORY_TRACT
  Filled 2022-05-12 (×4): qty 3

## 2022-05-12 MED ORDER — POTASSIUM CHLORIDE 20 MEQ PO PACK
40.0000 meq | PACK | Freq: Two times a day (BID) | ORAL | Status: DC
  Filled 2022-05-12: qty 2

## 2022-05-12 MED ORDER — IPRATROPIUM-ALBUTEROL 0.5-2.5 (3) MG/3ML IN SOLN
3.0000 mL | Freq: Once | RESPIRATORY_TRACT | Status: AC
  Administered 2022-05-12: 3 mL via RESPIRATORY_TRACT
  Filled 2022-05-12: qty 3

## 2022-05-12 MED ORDER — IOHEXOL 350 MG/ML SOLN
80.0000 mL | Freq: Once | INTRAVENOUS | Status: AC | PRN
  Administered 2022-05-12: 80 mL via INTRAVENOUS

## 2022-05-12 MED ORDER — PIPERACILLIN-TAZOBACTAM 3.375 G IVPB 30 MIN
3.3750 g | Freq: Once | INTRAVENOUS | Status: AC
  Administered 2022-05-12: 3.375 g via INTRAVENOUS
  Filled 2022-05-12: qty 50

## 2022-05-12 MED ORDER — ONDANSETRON HCL 4 MG/2ML IJ SOLN: 4.0000 mg | Freq: Four times a day (QID) | INTRAMUSCULAR | Status: DC | PRN

## 2022-05-12 MED ORDER — SODIUM CHLORIDE 0.9 % IV SOLN: INTRAVENOUS | Status: DC

## 2022-05-12 MED ORDER — SODIUM CHLORIDE 0.9 % IV SOLN
3.0000 g | Freq: Four times a day (QID) | INTRAVENOUS | Status: DC
  Administered 2022-05-12: 3 g via INTRAVENOUS
  Filled 2022-05-12 (×2): qty 8

## 2022-05-12 MED ORDER — PIPERACILLIN-TAZOBACTAM 3.375 G IVPB
3.3750 g | Freq: Three times a day (TID) | INTRAVENOUS | Status: DC
  Administered 2022-05-13 (×2): 3.375 g via INTRAVENOUS
  Filled 2022-05-12 (×2): qty 50

## 2022-05-12 MED ORDER — ALBUTEROL SULFATE (2.5 MG/3ML) 0.083% IN NEBU
5.0000 mg | INHALATION_SOLUTION | Freq: Once | RESPIRATORY_TRACT | Status: AC
  Administered 2022-05-12: 5 mg via RESPIRATORY_TRACT
  Filled 2022-05-12: qty 6

## 2022-05-12 MED ORDER — ONDANSETRON HCL 4 MG PO TABS: 4.0000 mg | ORAL_TABLET | Freq: Four times a day (QID) | ORAL | Status: DC | PRN

## 2022-05-12 MED ORDER — ALBUTEROL SULFATE (2.5 MG/3ML) 0.083% IN NEBU: 2.5000 mg | INHALATION_SOLUTION | RESPIRATORY_TRACT | Status: DC | PRN

## 2022-05-12 MED ORDER — ACETAMINOPHEN 325 MG PO TABS: 650.0000 mg | ORAL_TABLET | Freq: Four times a day (QID) | ORAL | Status: DC | PRN

## 2022-05-12 MED ORDER — SODIUM CHLORIDE (PF) 0.9 % IJ SOLN
INTRAMUSCULAR | Status: AC
  Filled 2022-05-12: qty 50

## 2022-05-12 MED ORDER — PRAMIPEXOLE DIHYDROCHLORIDE 0.25 MG PO TABS: 0.1250 mg | ORAL_TABLET | Freq: Every evening | ORAL | Status: DC | PRN

## 2022-05-12 MED ORDER — GUAIFENESIN ER 600 MG PO TB12
600.0000 mg | ORAL_TABLET | Freq: Two times a day (BID) | ORAL | Status: DC
  Administered 2022-05-13: 600 mg via ORAL
  Filled 2022-05-12 (×2): qty 1

## 2022-05-12 NOTE — Progress Notes (Signed)
A consult was received from an ED physician for Zosyn per pharmacy dosing.  The patient's profile has been reviewed for ht/wt/allergies/indication/available labs.   A one time order has been placed for Zosyn 3.375g.  Further antibiotics/pharmacy consults should be ordered by admitting physician if indicated.                       Thank you, Peggyann Juba, PharmD, BCPS 05/12/2022  2:50 PM

## 2022-05-12 NOTE — Progress Notes (Signed)
Pharmacy Antibiotic Note  [name] is a 89 y.o. female admitted on 05/12/2022 with  aspiration pneumonia .  Pharmacy has been consulted for Unasyn dosing.  Plan: Unasyn 3g IV q6 Will sign off  Height: 5\' 1"  (154.9 cm) Weight: 76 kg (167 lb 8.8 oz) IBW/kg (Calculated) : 47.8  Temp (24hrs), Avg:98.3 F (36.8 C), Min:98.3 F (36.8 C), Max:98.3 F (36.8 C)  Recent Labs  Lab 05/12/22 1140 05/12/22 1631  WBC 3.9*  --   CREATININE 0.44  --   LATICACIDVEN 1.8 1.8    Estimated Creatinine Clearance: 41.9 mL/min (by C-G formula based on SCr of 0.44 mg/dL).    No Known Allergies   Thank you for allowing pharmacy to be a part of this patient's care.  Kara Mead 05/12/2022 5:42 PM

## 2022-05-12 NOTE — ED Notes (Signed)
Patient transported to CT 

## 2022-05-12 NOTE — H&P (Signed)
History and Physical    Patient: [name] [id number] DOB: 05-01-1927 DOA: 05/12/2022 DOS: the patient was seen and examined on 05/12/2022 PCP: Eulas Post, MD  Patient coming from: SNF bed bound    Chief Complaint: shortness of breath/cough   HPI: [name] is a 89 y.o. female with medical history significant of chronic back pain, GERD, HLD, hx of PE on eliquis, HTN, HLD, RLS who presented to ED with complaints of coughing.  She is at a SNF and they did a CXR on her due to her coughing all day Sunday.  It showed pneumonia and she was started on levaquin on Monday or Tuesday.  She was also put on 2L oxygen on Sunday at the SNF and daughter reports because it dropped to 89%.  She continued to get worse so they brought her to the ED. Her daughter is at bedside and gives history. She also think she may have had fever yesterday morning. She has complained of just feeling awful. She has poor PO intake. She has probably lost 30 pounds since the first of December.     Denies vision changes/headaches, chest pain or palpitations, abdominal pain, N/V/D, dysuria or leg swelling. Chronic sacral ulcer that has been hurting her.    She does not smoke or drink.   ER Course:  vitals: afebrile, bp: 136/76, HR: 87, RR:   oxygen: 95% 2L [place] Pertinent labs: potassium: 3.2, lactic acid wnl, BC pending CXR: low lung volumes with possible small effusions CTA chest: NO PE, but limited eval. Large hiatal hernia with intrathoracic stomach. Unchanged mild lower lung predominant reticular opacities with associated traction bronchiectasis, findings can be seen in the setting of interstitial lung disease Small amount of debris seen in the right mainstem bronchus and RLL, consistent with aspiration.  In ED: given zosyn, duoneb and albuterol. BC obtained. TRH asked to admit.    Review of Systems: As mentioned in the history of present illness. All other systems reviewed and are  negative. Past Medical History:  Diagnosis Date   BACK PAIN, LUMBAR, CHRONIC 02/20/2010   Cardiomegaly 03/16/2010   COLONIC POLYPS 02/20/2010   DIVERTICULITIS, HX OF 02/20/2010   GERD 02/20/2010   GOITER 02/20/2010   HYPERLIPIDEMIA 02/20/2010   OSTEOARTHRITIS, MULTIPLE JOINTS 02/20/2010   URINARY INCONTINENCE 02/20/2010   Past Surgical History:  Procedure Laterality Date   ABDOMINAL HYSTERECTOMY     NOSE SURGERY  1975   head surgery to repair cracked bone over nose    SPINE SURGERY     4 back surgeries   THYROIDECTOMY, PARTIAL     ? goiter, no CA   TONSILLECTOMY  1936   TOTAL SHOULDER ARTHROPLASTY  07/30/2011   Procedure: TOTAL SHOULDER ARTHROPLASTY;  Surgeon: Augustin Schooling, MD;  Location: [facility];  Service: Orthopedics;  Laterality: Right;  Right Reversed Total Shoulder   Social History:  reports that she has never smoked. She has never used smokeless tobacco. She reports that she does not drink alcohol and does not use drugs.  No Known Allergies  Family History  Problem Relation Age of Onset   Cancer Mother        ovarian   Hyperlipidemia Mother    Diabetes Mother        type ll    Prior to Admission medications   Medication Sig Start Date End Date Taking? Authorizing Provider  acetaminophen (TYLENOL) 500 MG tablet Take 500 mg by mouth at bedtime.    [provider]  albuterol (VENTOLIN HFA) 108 (90 Base) MCG/ACT inhaler Inhale 2 puffs into the lungs every 4 (four) hours as needed for wheezing or shortness of breath. Patient not taking: Reported on 01/22/2022 10/13/21   Eulas Post, MD  apixaban (ELIQUIS) 5 MG TABS tablet AT THE START OF THERAPY , TAKE 2 TABLETS TWICE A DAY FOR 7 DAYS AND THEN 1 TABLET TWICE A DAY THEREAFTER Patient taking differently: Take 5 mg by mouth 2 (two) times daily. 11/25/21   Burchette, Alinda Sierras, MD  baclofen (LIORESAL) 10 MG tablet Take 1 tablet (10 mg total) by mouth at bedtime. 01/25/22   Amin, Jeanella Flattery, MD  diltiazem (CARDIZEM CD) 120 MG  24 hr capsule Take 1 capsule (120 mg total) by mouth daily. 11/02/21   Burchette, Alinda Sierras, MD  FEROSUL 325 (65 Fe) MG tablet Take 325 mg by mouth in the morning. 01/19/22   [provider]  furosemide (LASIX) 20 MG tablet Take 1 tablet once daily as needed for edema Patient not taking: Reported on 01/22/2022 10/30/21   Eulas Post, MD  Lactobacillus-Inulin ([place]) CAPS Take 1 capsule by mouth 2 (two) times daily before a meal. 01/18/22   [provider]  losartan (COZAAR) 100 MG tablet Take 1 tablet (100 mg total) by mouth daily. 10/30/21   Burchette, Alinda Sierras, MD  melatonin 5 MG TABS Take 5 mg by mouth at bedtime.    [provider]  MUCINEX 600 MG 12 hr tablet Take 600 mg by mouth every 12 (twelve) hours as needed for cough. 01/20/22   [provider]  Multiple Vitamin (MULITIVITAMIN WITH MINERALS) TABS Take 1 tablet by mouth daily.    [provider]  pramipexole (MIRAPEX) 0.125 MG tablet TAKE 1 TO 2 TABLETS BY MOUTH AT NIGHT AS NEEDED FOR RESTLESS LEGS OR SYMPTOMS Patient taking differently: Take 0.125 mg by mouth at bedtime as needed (for restless legs). 01/13/22   Burchette, Alinda Sierras, MD  senna-docusate (SENOKOT-S) 8.6-50 MG tablet Take 1 tablet by mouth at bedtime as needed for mild constipation. 01/25/22   Damita Lack, MD  Spacer/Aero-Holding Chambers (AEROCHAMBER MV) inhaler Use as instructed 10/13/21   Burchette, Alinda Sierras, MD  traMADol (ULTRAM) 50 MG tablet Take 1 tablet (50 mg total) by mouth every 12 (twelve) hours as needed. 01/25/22   Damita Lack, MD    Physical Exam: Vitals:   05/12/22 1119 05/12/22 1501 05/12/22 1723 05/12/22 2004  BP:   (!) 107/48   Pulse:   (!) 103   Resp:   18   Temp:  98.3 F (36.8 C) 98.3 F (36.8 C)   TempSrc:   Oral   SpO2:   92% 96%  Weight: 76 kg     Height: 5\' 1"  (1.549 m)      General:  Appears calm and comfortable and is in NAD. Frail appearing  Eyes:  PERRL, EOMI,  normal lids, iris ENT:  HOH lips & tongue, mmm; appropriate dentition Neck:  no LAD, masses or thyromegaly; no carotid bruits Cardiovascular:  RRR, no m/r/g. No LE edema.  Respiratory:   audible expiratory wheezing throughout, crackles in bilateral bases and velcro sounds. Normal effort.  Abdomen:  soft, NT, ND, NABS Back:   normal alignment, no CVAT Skin:  no rash or induration seen on limited exam. Sacral ulcer, deep. Some drainage/surrounding erythema.  Musculoskeletal:  globally weak and deconditioned.  Lower extremity:  No LE edema.  Limited foot exam  with no ulcerations.  2+ distal pulses. Psychiatric:  grossly normal mood and affect, speech fluent and appropriate, AOx3 Neurologic:  CN 2-12 grossly intact, moves all extremities in coordinated fashion, sensation intact. Weak, but follows commands.    Radiological Exams on Admission: Independently reviewed - see discussion in A/P where applicable  DG Sacrum/Coccyx  Result Date: 05/12/2022 CLINICAL DATA:  Open wound over the sacrum and coccyx EXAM: SACRUM AND COCCYX - 2+ VIEW COMPARISON:  None Available. FINDINGS: Diffuse bone demineralization. Postoperative changes with posterior fixation from L3 through S2. Fixation hardware appears intact. No significant bone lucency at the bone hardware interfaces. The sacral end of the fixation rod extends dorsally with the tip at or just below the skin surface. This may result in skin erosion. Correlate with physical examination. No evidence of acute fracture or dislocation. SI joints and symphysis pubis are not displaced. No bone erosion or sclerosis to suggest osteomyelitis. Degenerative changes in the lower lumbar spine and hips. Residual contrast material in the renal collecting systems and in the bladder. Bladder trabeculation. IMPRESSION: 1. No acute bony abnormalities. No radiographic evidence of osteomyelitis. 2. Postoperative changes with fixation of the lower lumbar and sacral spine. Distal  fixation hardware extends at or just below the skin surface possibly resulting in skin erosion. Correlate with physical examination. 3. Degenerative changes. Electronically Signed   By: Lucienne Capers M.D.   On: 05/12/2022 19:17   CT Angio Chest PE W and/or Wo Contrast  Result Date: 05/12/2022 CLINICAL DATA:  Pulmonary embolus suspected EXAM: CT ANGIOGRAPHY CHEST WITH CONTRAST TECHNIQUE: Multidetector CT imaging of the chest was performed using the standard protocol during bolus administration of intravenous contrast. Multiplanar CT image reconstructions and MIPs were obtained to evaluate the vascular anatomy. RADIATION DOSE REDUCTION: This exam was performed according to the departmental dose-optimization program which includes automated exposure control, adjustment of the mA and/or kV according to patient size and/or use of iterative reconstruction technique. CONTRAST:  7mL OMNIPAQUE IOHEXOL 350 MG/ML SOLN COMPARISON:  Chest CT dated January 23, 2022; CT angio dated November 15, 2018 FINDINGS: Cardiovascular: Mild cardiomegaly. No pericardial effusion. Normal caliber thoracic aorta with moderate atherosclerotic disease. No evidence of pulmonary embolus, although evaluation of the segmental and subsegmental pulmonary arteries is limited due to streak and respiratory motion artifact. Mediastinum/Nodes: Large hiatal hernia with intrathoracic stomach. No enlarged lymph nodes seen in the chest. Lungs/Pleura: Small amount of debris seen in the right mainstem bronchus and right lower lobe. Expiratory phase imaging with excessive inward bulging posterior tracheal membrane. Elevation of the right hemidiaphragm. Lower lung predominant reticular opacities with associated traction bronchiectasis. Bibasilar atelectasis. No consolidation, pleural effusion or pneumothorax. Upper Abdomen: Gallstones.  No acute abnormality. Musculoskeletal: Bilateral total shoulder arthroplasties. No aggressive appearing osseous lesions.  Review of the MIP images confirms the above findings. IMPRESSION: 1. No evidence of pulmonary embolus, although evaluation of the segmental and subsegmental pulmonary arteries is limited due to streak and respiratory motion artifact. 2. Large hiatal hernia with intrathoracic stomach. 3. Unchanged mild lower lung predominant reticular opacities with associated traction bronchiectasis, findings can be seen in the setting of interstitial lung disease. 4. Small amount of debris seen in the right mainstem bronchus and right lower lobe, consistent with aspiration. 5. Expiratory phase imaging with excessive dynamic airway collapse. 6. Aortic Atherosclerosis (ICD10-I70.0). Electronically Signed   By: Yetta Glassman M.D.   On: 05/12/2022 14:24   DG Chest Port 1 View  Result Date: 05/12/2022 CLINICAL DATA:  sob EXAM:  PORTABLE CHEST - 1 VIEW COMPARISON:  01/22/2022 FINDINGS: Low lung volumes with crowding of perihilar bronchovascular structures. No new infiltrate or overt edema. Heart size and mediastinal contours are within normal limits. Aortic Atherosclerosis (ICD10-170.0). Blunting of costophrenic angles suggesting small effusions. Bilateral shoulder arthroplasty hardware. IMPRESSION: Low volumes with possible small effusions. Electronically Signed   By: Lucrezia Europe M.D.   On: 05/12/2022 11:39    EKG: Independently reviewed.  NSR with rate 89; nonspecific ST changes with no evidence of acute ischemia   Labs on Admission: I have personally reviewed the available labs and imaging studies at the time of the admission.  Pertinent labs:   potassium: 3.2,  lactic acid wnl,  BC pending  Assessment and Plan: Principal Problem:   Acute respiratory failure with hypoxia secondary to aspiration pneumonia Active Problems:   Hypokalemia   Failure to thrive in adult   Sacral decubitus ulcer   Hypertension   History of pulmonary embolus (PE)   RLS (restless legs syndrome)   Aspiration pneumonia (HCC)     Assessment and Plan: * Acute respiratory failure with hypoxia secondary to aspiration pneumonia 89 year old female at SNF who presented with acute respiratory failure on 2L oxygen and worsening cough/shortness of breath found to have aspiration pneumonia  -obs to telemetry  -no sepsis criteria, reported fever at SNF, but none recorded here today  -CTA chest with findings consistent for aspiration  -also ? Bronchiectasis/ILD that could be contributing  -covid negative -will check PCT and RVP -started on zosyn in ED, continue to cover for aspiration and will also cover bronchiecstasis exacerbation  -BC pending, lactic acid wnl. -continue duonebs/SABA prn  -sputum cx -IS to bedside  -mucinex Bid  -respiratory consult -NPO until SLP eval  -aspiration precautions   Hypokalemia Check magnesium Replete and trend   Failure to thrive in adult Bedbound with 30 pound weight loss since last year and poor PO intake Check B12/TSH Palliative care with [facility] SLP swallowing eval  Nutrition consult   Sacral decubitus ulcer Draining with surrounding erythema No WBC or sepsis criteria Check xray Wound care consult  Nutrition consult  Turn patient q 4 hours   Hypertension Bp soft, hold  home losartan and diltiazem for now   History of pulmonary embolus (PE) No PE on CTA chest at admission  Continue eliquis   RLS (restless legs syndrome) Continue pramipexole      Advance Care Planning:   Code Status: DNR   Consults: RT/SLP/palliative care and nutrition   DVT Prophylaxis: eliquis   Family Communication: daughter at bedside   Severity of Illness: The appropriate patient status for this patient is OBSERVATION. Observation status is judged to be reasonable and necessary in order to provide the required intensity of service to ensure the patient's safety. The patient's presenting symptoms, physical exam findings, and initial radiographic and laboratory data in the context of their  medical condition is felt to place them at decreased risk for further clinical deterioration. Furthermore, it is anticipated that the patient will be medically stable for discharge from the hospital within 2 midnights of admission.   Author: Orma Flaming, MD 05/12/2022 9:15 PM  For on call review [email or web address].

## 2022-05-12 NOTE — Assessment & Plan Note (Addendum)
Bp soft, hold  home losartan and diltiazem for now

## 2022-05-12 NOTE — Assessment & Plan Note (Signed)
No PE on CTA chest at admission  Continue eliquis

## 2022-05-12 NOTE — Assessment & Plan Note (Addendum)
Bedbound with 30 pound weight loss since last year and poor PO intake Check B12/TSH Palliative care with [facility] SLP swallowing eval  Nutrition consult

## 2022-05-12 NOTE — ED Notes (Signed)
ED TO INPATIENT HANDOFF REPORT  ED Nurse Name and Phone #: Tia, RN  S Name/Age/Gender [name] 89 y.o. female Room/Bed: WA24/WA24  Code Status   Code Status: Prior  Home/SNF/Other Skilled nursing facility Patient oriented to: self and place Is this baseline? Yes   Triage Complete: Triage complete  Chief Complaint Acute respiratory failure with hypoxia [facility]) [J96.01]  Triage Note Patient brought in from [facility] with c/o SOB. Per EMS patient was diagnosed with pneumonia on Sunday and was started on PO ABX but has had worsening SOB. 150/90 80 20 2L [place]   Allergies No Known Allergies  Level of Care/Admitting Diagnosis ED Disposition     ED Disposition  Admit   Condition  --   [place]: [place] [[id number]]  Level of Care: Progressive [102]  Admit to Progressive based on following criteria: MULTISYSTEM THREATS such as stable sepsis, metabolic/electrolyte imbalance with or without encephalopathy that is responding to early treatment.  May place patient in observation at [facility] or [place] if equivalent level of care is available:: Yes  Covid Evaluation: Confirmed COVID Negative  Diagnosis: Acute respiratory failure with hypoxia [id number]  Admitting Physician: Orma Flaming [id number]  Attending Physician: Adora Fridge          B Medical/Surgery History Past Medical History:  Diagnosis Date   BACK PAIN, LUMBAR, CHRONIC 02/20/2010   Cardiomegaly 03/16/2010   COLONIC POLYPS 02/20/2010   DIVERTICULITIS, HX OF 02/20/2010   GERD 02/20/2010   GOITER 02/20/2010   HYPERLIPIDEMIA 02/20/2010   OSTEOARTHRITIS, MULTIPLE JOINTS 02/20/2010   URINARY INCONTINENCE 02/20/2010   Past Surgical History:  Procedure Laterality Date   ABDOMINAL HYSTERECTOMY     NOSE SURGERY  1975   head surgery to repair cracked bone over nose    SPINE SURGERY     4 back surgeries   THYROIDECTOMY, PARTIAL     ? goiter, no CA    TONSILLECTOMY  1936   TOTAL SHOULDER ARTHROPLASTY  07/30/2011   Procedure: TOTAL SHOULDER ARTHROPLASTY;  Surgeon: Augustin Schooling, MD;  Location: [place];  Service: Orthopedics;  Laterality: Right;  Right Reversed Total Shoulder     A IV Location/Drains/Wounds Patient Lines/Drains/Airways Status     Active Line/Drains/Airways     Name Placement date Placement time Site Days   Peripheral IV 01/25/22 22 G 1" Right;Medial Forearm 01/25/22  2149  Forearm  107   Peripheral IV 05/12/22 20 G Right Antecubital 05/12/22  1100  Antecubital  less than 1   Peripheral IV 05/12/22 20 G 1" Left Antecubital 05/12/22  1149  Antecubital  less than 1   External Urinary Catheter 01/22/22  1913  --  110            Intake/Output Last 24 hours  Intake/Output Summary (Last 24 hours) at 05/12/2022 1604 Last data filed at 05/12/2022 1538 Gross per 24 hour  Intake 41.4 ml  Output --  Net 41.4 ml    Labs/Imaging Results for orders placed or performed during the hospital encounter of 05/12/22 (from the past 48 hour(s))  Basic metabolic panel     Status: Abnormal   Collection Time: 05/12/22 11:40 AM  Result Value Ref Range   Sodium 136 135 - 145 mmol/L   Potassium 3.2 (L) 3.5 - 5.1 mmol/L   Chloride 104 98 - 111 mmol/L   CO2 26 22 - 32 mmol/L   Glucose, Bld 103 (H) 70 - 99 mg/dL  Comment: Glucose reference range applies only to samples taken after fasting for at least 8 hours.   BUN 17 8 - 23 mg/dL   Creatinine, Ser 0.44 0.44 - 1.00 mg/dL   Calcium 8.3 (L) 8.9 - 10.3 mg/dL   GFR, Estimated >60 >60 mL/min    Comment: (NOTE) Calculated using the CKD-EPI Creatinine Equation (2021)    Anion gap 6 5 - 15    Comment: Performed at [facility], [place] [address] [place], [place] [address]  Brain natriuretic peptide     Status: None   Collection Time: 05/12/22 11:40 AM  Result Value Ref Range   B Natriuretic Peptide 66.0 0.0 - 100.0 pg/mL    Comment: Performed at [facility], [place] [address] [place], [place] [address]  CBC with Differential     Status: Abnormal   Collection Time: 05/12/22 11:40 AM  Result Value Ref Range   WBC 3.9 (L) 4.0 - 10.5 K/uL   RBC 4.71 3.87 - 5.11 MIL/uL   Hemoglobin 12.1 12.0 - 15.0 g/dL   HCT 39.2 36.0 - 46.0 %   MCV 83.2 80.0 - 100.0 fL   MCH 25.7 (L) 26.0 - 34.0 pg   MCHC 30.9 30.0 - 36.0 g/dL   RDW 17.6 (H) 11.5 - 15.5 %   Platelets 328 150 - 400 K/uL   nRBC 0.0 0.0 - 0.2 %   Neutrophils Relative % 50 %   Neutro Abs 1.9 1.7 - 7.7 K/uL   Lymphocytes Relative 37 %   Lymphs Abs 1.4 0.7 - 4.0 K/uL   Monocytes Relative 13 %   Monocytes Absolute 0.5 0.1 - 1.0 K/uL   Eosinophils Relative 0 %   Eosinophils Absolute 0.0 0.0 - 0.5 K/uL   Basophils Relative 0 %   Basophils Absolute 0.0 0.0 - 0.1 K/uL   Immature Granulocytes 0 %   Abs Immature Granulocytes 0.01 0.00 - 0.07 K/uL    Comment: Performed at [facility], [place] [address] [place], [place] [address]  Lactic acid, plasma     Status: None   Collection Time: 05/12/22 11:40 AM  Result Value Ref Range   Lactic Acid, Venous 1.8 0.5 - 1.9 mmol/L    Comment: Performed at [facility], [place] [address] [place],  [address]  SARS Coronavirus 2 by RT PCR (hospital order, performed in [facility] hospital lab) *cepheid single result test*     Status: None   Collection Time: 05/12/22 11:40 AM   Specimen: Nasal Swab  Result Value Ref Range   SARS Coronavirus 2 by RT PCR NEGATIVE NEGATIVE    Comment: (NOTE) SARS-CoV-2 target nucleic acids are NOT DETECTED.  The SARS-CoV-2 RNA is generally detectable in upper and lower respiratory specimens during the acute phase of infection. The lowest concentration of SARS-CoV-2 viral copies this assay can detect is 250 copies / mL. A negative result does not preclude SARS-CoV-2 infection and should not be used as the sole basis for treatment or other patient management decisions.   A negative result may occur with improper specimen collection / handling, submission of specimen other than nasopharyngeal swab, presence of viral mutation(s) within the areas targeted by this assay, and inadequate number of viral copies (<250 copies / mL). A negative result must be combined with clinical observations, patient history, and epidemiological information.  Fact Sheet for Patients:   https://www.patel.info/  Fact Sheet for Healthcare Providers: https://hall.com/  This test is not yet approved  or  cleared by the [place] and has been authorized for detection and/or diagnosis of SARS-CoV-2 by FDA under an Emergency Use Authorization (EUA).  This EUA will remain in effect (meaning this test can be used) for the duration of the COVID-19 declaration under Section 564(b)(1) of the Act, 21 U.S.C. section 360bbb-3(b)(1), unless the authorization is terminated or revoked sooner.  Performed at [facility] [place] [address], [place], [place] [address]    CT Angio Chest PE W and/or Wo Contrast  Result Date: 05/12/2022 CLINICAL DATA:  Pulmonary embolus suspected EXAM: CT ANGIOGRAPHY CHEST WITH CONTRAST TECHNIQUE: Multidetector CT imaging of the chest was performed using the standard protocol during bolus administration of intravenous contrast. Multiplanar CT image reconstructions and MIPs were obtained to evaluate the vascular anatomy. RADIATION DOSE REDUCTION: This exam was performed according to the departmental dose-optimization program which includes automated exposure control, adjustment of the mA and/or kV according to patient size and/or use of iterative reconstruction technique. CONTRAST:  76mL OMNIPAQUE IOHEXOL 350 MG/ML SOLN COMPARISON:  Chest CT dated January 23, 2022; CT angio dated November 15, 2018 FINDINGS: Cardiovascular: Mild cardiomegaly. No pericardial effusion. Normal caliber thoracic aorta with  moderate atherosclerotic disease. No evidence of pulmonary embolus, although evaluation of the segmental and subsegmental pulmonary arteries is limited due to streak and respiratory motion artifact. Mediastinum/Nodes: Large hiatal hernia with intrathoracic stomach. No enlarged lymph nodes seen in the chest. Lungs/Pleura: Small amount of debris seen in the right mainstem bronchus and right lower lobe. Expiratory phase imaging with excessive inward bulging posterior tracheal membrane. Elevation of the right hemidiaphragm. Lower lung predominant reticular opacities with associated traction bronchiectasis. Bibasilar atelectasis. No consolidation, pleural effusion or pneumothorax. Upper Abdomen: Gallstones.  No acute abnormality. Musculoskeletal: Bilateral total shoulder arthroplasties. No aggressive appearing osseous lesions. Review of the MIP images confirms the above findings. IMPRESSION: 1. No evidence of pulmonary embolus, although evaluation of the segmental and subsegmental pulmonary arteries is limited due to streak and respiratory motion artifact. 2. Large hiatal hernia with intrathoracic stomach. 3. Unchanged mild lower lung predominant reticular opacities with associated traction bronchiectasis, findings can be seen in the setting of interstitial lung disease. 4. Small amount of debris seen in the right mainstem bronchus and right lower lobe, consistent with aspiration. 5. Expiratory phase imaging with excessive dynamic airway collapse. 6. Aortic Atherosclerosis (ICD10-I70.0). Electronically Signed   By: Yetta Glassman M.D.   On: 05/12/2022 14:24   DG Chest Port 1 View  Result Date: 05/12/2022 CLINICAL DATA:  sob EXAM: PORTABLE CHEST - 1 VIEW COMPARISON:  01/22/2022 FINDINGS: Low lung volumes with crowding of perihilar bronchovascular structures. No new infiltrate or overt edema. Heart size and mediastinal contours are within normal limits. Aortic Atherosclerosis (ICD10-170.0). Blunting of costophrenic  angles suggesting small effusions. Bilateral shoulder arthroplasty hardware. IMPRESSION: Low volumes with possible small effusions. Electronically Signed   By: Lucrezia Europe M.D.   On: 05/12/2022 11:39    Pending Labs Unresulted Labs (From admission, onward)     Start     Ordered   05/12/22 1555  Procalcitonin  Daily,   R     References:    Procalcitonin Lower Respiratory Tract Infection AND Sepsis Procalcitonin Algorithm   05/12/22 1554   05/12/22 1548  Magnesium  Once,   STAT        05/12/22 1547   05/12/22 1119  Lactic acid, plasma  Now then every 2 hours,   R (with STAT occurrences)  05/12/22 1118   05/12/22 1119  Culture, blood (routine x 2)  BLOOD CULTURE X 2,   R (with STAT occurrences)      05/12/22 1118            Vitals/Pain Today's Vitals   05/12/22 1118 05/12/22 1119 05/12/22 1501  BP: 136/76    Pulse: 87    Temp: 98.3 F (36.8 C)  98.3 F (36.8 C)  TempSrc: Oral    SpO2: 95%    Weight:  76 kg   Height:  5\' 1"  (1.549 m)   PainSc:  0-No pain 0-No pain    Isolation Precautions Airborne and Contact precautions  Medications Medications  potassium chloride (KLOR-CON) packet 40 mEq (has no administration in time range)  ipratropium-albuterol (DUONEB) 0.5-2.5 (3) MG/3ML nebulizer solution 3 mL (has no administration in time range)  ipratropium-albuterol (DUONEB) 0.5-2.5 (3) MG/3ML nebulizer solution 3 mL (3 mLs Nebulization Given 05/12/22 1223)  iohexol (OMNIPAQUE) 350 MG/ML injection 80 mL (80 mLs Intravenous Contrast Given 05/12/22 1356)  sodium chloride (PF) 0.9 % injection (  Given by Other 05/12/22 1402)  piperacillin-tazobactam (ZOSYN) IVPB 3.375 g (0 g Intravenous Stopped 05/12/22 1538)  albuterol (PROVENTIL) (2.5 MG/3ML) 0.083% nebulizer solution 5 mg (5 mg Nebulization Given 05/12/22 1545)    Mobility non-ambulatory     Focused Assessments Neuro Assessment Handoff:  Swallow screen pass?  N/A         Neuro Assessment:   Neuro Checks:      Has  TPA been given? No If patient is a Neuro Trauma and patient is going to OR before floor call report to [place] nurse: [phone] or [phone]   R Recommendations: See Admitting Provider Note  Report given to:   Additional Notes:

## 2022-05-12 NOTE — Plan of Care (Signed)
  Problem: Education: Goal: Knowledge of General Education information will improve Description: Including pain rating scale, medication(s)/side effects and non-pharmacologic comfort measures Outcome: Progressing   Problem: Clinical Measurements: Goal: Ability to maintain clinical measurements within normal limits will improve Outcome: Progressing   Problem: Safety: Goal: Ability to remain free from injury will improve Outcome: Progressing   

## 2022-05-12 NOTE — Assessment & Plan Note (Addendum)
Draining with surrounding erythema No WBC or sepsis criteria Check xray Wound care consult  Nutrition consult  Turn patient q 4 hours

## 2022-05-12 NOTE — Assessment & Plan Note (Addendum)
89 year old female at SNF who presented with acute respiratory failure on 2L oxygen and worsening cough/shortness of breath found to have aspiration pneumonia  -obs to telemetry  -no sepsis criteria, reported fever at SNF, but none recorded here today  -CTA chest with findings consistent for aspiration  -also ? Bronchiectasis/ILD that could be contributing  -covid negative -will check PCT and RVP -started on zosyn in ED, continue to cover for aspiration and will also cover bronchiecstasis exacerbation  -BC pending, lactic acid wnl. -continue duonebs/SABA prn  -sputum cx -IS to bedside  -mucinex Bid  -respiratory consult -NPO until SLP eval  -aspiration precautions

## 2022-05-12 NOTE — Assessment & Plan Note (Signed)
Check magnesium Replete and trend  

## 2022-05-12 NOTE — Assessment & Plan Note (Signed)
Continue pramipexole

## 2022-05-12 NOTE — ED Provider Notes (Signed)
[place] EMERGENCY DEPARTMENT AT [facility] Provider Note   CSN: [id number] Arrival date & time: 05/12/22  1106     History  Chief Complaint  Patient presents with   Shortness of Breath    Current Pneumonia    [name] is a 89 y.o. female.  Pt is a 89 yo female with pmhx significant for hld, gerd, oa, and PE (on Eliquis).  Pt was diagnosed with pna on 3/24.  She was put on 2L oxygen via [place] and was treated with Levaquin.  Pt is still not any better.  She feels worse and more sob.         Home Medications Prior to Admission medications   Medication Sig Start Date End Date Taking? Authorizing Provider  acetaminophen (TYLENOL) 500 MG tablet Take 500 mg by mouth at bedtime.    [provider]  albuterol (VENTOLIN HFA) 108 (90 Base) MCG/ACT inhaler Inhale 2 puffs into the lungs every 4 (four) hours as needed for wheezing or shortness of breath. Patient not taking: Reported on 01/22/2022 10/13/21   Eulas Post, MD  apixaban (ELIQUIS) 5 MG TABS tablet AT THE START OF THERAPY , TAKE 2 TABLETS TWICE A DAY FOR 7 DAYS AND THEN 1 TABLET TWICE A DAY THEREAFTER Patient taking differently: Take 5 mg by mouth 2 (two) times daily. 11/25/21   Burchette, Alinda Sierras, MD  baclofen (LIORESAL) 10 MG tablet Take 1 tablet (10 mg total) by mouth at bedtime. 01/25/22   Amin, Jeanella Flattery, MD  diltiazem (CARDIZEM CD) 120 MG 24 hr capsule Take 1 capsule (120 mg total) by mouth daily. 11/02/21   Burchette, Alinda Sierras, MD  FEROSUL 325 (65 Fe) MG tablet Take 325 mg by mouth in the morning. 01/19/22   [provider]  furosemide (LASIX) 20 MG tablet Take 1 tablet once daily as needed for edema Patient not taking: Reported on 01/22/2022 10/30/21   Eulas Post, MD  Lactobacillus-Inulin ([place]) CAPS Take 1 capsule by mouth 2 (two) times daily before a meal. 01/18/22   [provider]  losartan (COZAAR) 100 MG tablet Take 1 tablet (100 mg  total) by mouth daily. 10/30/21   Burchette, Alinda Sierras, MD  melatonin 5 MG TABS Take 5 mg by mouth at bedtime.    [provider]  MUCINEX 600 MG 12 hr tablet Take 600 mg by mouth every 12 (twelve) hours as needed for cough. 01/20/22   [provider]  Multiple Vitamin (MULITIVITAMIN WITH MINERALS) TABS Take 1 tablet by mouth daily.    [provider]  pramipexole (MIRAPEX) 0.125 MG tablet TAKE 1 TO 2 TABLETS BY MOUTH AT NIGHT AS NEEDED FOR RESTLESS LEGS OR SYMPTOMS Patient taking differently: Take 0.125 mg by mouth at bedtime as needed (for restless legs). 01/13/22   Burchette, Alinda Sierras, MD  senna-docusate (SENOKOT-S) 8.6-50 MG tablet Take 1 tablet by mouth at bedtime as needed for mild constipation. 01/25/22   Damita Lack, MD  Spacer/Aero-Holding Chambers (AEROCHAMBER MV) inhaler Use as instructed 10/13/21   Burchette, Alinda Sierras, MD  traMADol (ULTRAM) 50 MG tablet Take 1 tablet (50 mg total) by mouth every 12 (twelve) hours as needed. 01/25/22   Damita Lack, MD      Allergies    Patient has no known allergies.    Review of Systems   Review of Systems  Respiratory:  Positive for cough and shortness of breath.   All other systems  reviewed and are negative.   Physical Exam Updated Vital Signs BP 136/76 (BP Location: Left Arm)   Pulse 87   Temp 98.3 F (36.8 C)   Ht 5\' 1"  (1.549 m)   Wt 76 kg   SpO2 95%   BMI 31.66 kg/m  Physical Exam Vitals and nursing note reviewed.  Constitutional:      Appearance: She is well-developed.  HENT:     Head: Normocephalic and atraumatic.     Mouth/Throat:     Mouth: Mucous membranes are moist.     Pharynx: Oropharynx is clear.  Eyes:     Extraocular Movements: Extraocular movements intact.     Pupils: Pupils are equal, round, and reactive to light.  Cardiovascular:     Rate and Rhythm: Normal rate and regular rhythm.  Pulmonary:     Breath sounds: Wheezing and rhonchi present.  Abdominal:     General:  Bowel sounds are normal.     Palpations: Abdomen is soft.  Musculoskeletal:        General: Normal range of motion.     Cervical back: Normal range of motion and neck supple.  Skin:    General: Skin is warm.     Capillary Refill: Capillary refill takes less than 2 seconds.  Neurological:     General: No focal deficit present.     Mental Status: She is alert and oriented to person, place, and time.  Psychiatric:        Mood and Affect: Mood normal.        Behavior: Behavior normal.     ED Results / Procedures / Treatments   Labs (all labs ordered are listed, but only abnormal results are displayed) Labs Reviewed  BASIC METABOLIC PANEL - Abnormal; Notable for the following components:      Result Value   Potassium 3.2 (*)    Glucose, Bld 103 (*)    Calcium 8.3 (*)    All other components within normal limits  CBC WITH DIFFERENTIAL/PLATELET - Abnormal; Notable for the following components:   WBC 3.9 (*)    MCH 25.7 (*)    RDW 17.6 (*)    All other components within normal limits  SARS CORONAVIRUS 2 BY RT PCR  CULTURE, BLOOD (ROUTINE X 2)  CULTURE, BLOOD (ROUTINE X 2)  BRAIN NATRIURETIC PEPTIDE  LACTIC ACID, PLASMA  LACTIC ACID, PLASMA  MAGNESIUM    EKG EKG Interpretation  Date/Time:  Wednesday May 12 2022 11:59:03 EDT Ventricular Rate:  89 PR Interval:  166 QRS Duration: 74 QT Interval:  364 QTC Calculation: 443 R Axis:   32 Text Interpretation: Sinus rhythm Low voltage, precordial leads Probable anteroseptal infarct, old No significant change since last tracing Confirmed by Isla Pence [phone]) on 05/12/2022 12:21:03 PM  Radiology CT Angio Chest PE W and/or Wo Contrast  Result Date: 05/12/2022 CLINICAL DATA:  Pulmonary embolus suspected EXAM: CT ANGIOGRAPHY CHEST WITH CONTRAST TECHNIQUE: Multidetector CT imaging of the chest was performed using the standard protocol during bolus administration of intravenous contrast. Multiplanar CT image reconstructions and  MIPs were obtained to evaluate the vascular anatomy. RADIATION DOSE REDUCTION: This exam was performed according to the departmental dose-optimization program which includes automated exposure control, adjustment of the mA and/or kV according to patient size and/or use of iterative reconstruction technique. CONTRAST:  66mL OMNIPAQUE IOHEXOL 350 MG/ML SOLN COMPARISON:  Chest CT dated January 23, 2022; CT angio dated November 15, 2018 FINDINGS: Cardiovascular: Mild cardiomegaly. No pericardial effusion. Normal caliber  thoracic aorta with moderate atherosclerotic disease. No evidence of pulmonary embolus, although evaluation of the segmental and subsegmental pulmonary arteries is limited due to streak and respiratory motion artifact. Mediastinum/Nodes: Large hiatal hernia with intrathoracic stomach. No enlarged lymph nodes seen in the chest. Lungs/Pleura: Small amount of debris seen in the right mainstem bronchus and right lower lobe. Expiratory phase imaging with excessive inward bulging posterior tracheal membrane. Elevation of the right hemidiaphragm. Lower lung predominant reticular opacities with associated traction bronchiectasis. Bibasilar atelectasis. No consolidation, pleural effusion or pneumothorax. Upper Abdomen: Gallstones.  No acute abnormality. Musculoskeletal: Bilateral total shoulder arthroplasties. No aggressive appearing osseous lesions. Review of the MIP images confirms the above findings. IMPRESSION: 1. No evidence of pulmonary embolus, although evaluation of the segmental and subsegmental pulmonary arteries is limited due to streak and respiratory motion artifact. 2. Large hiatal hernia with intrathoracic stomach. 3. Unchanged mild lower lung predominant reticular opacities with associated traction bronchiectasis, findings can be seen in the setting of interstitial lung disease. 4. Small amount of debris seen in the right mainstem bronchus and right lower lobe, consistent with aspiration. 5.  Expiratory phase imaging with excessive dynamic airway collapse. 6. Aortic Atherosclerosis (ICD10-I70.0). Electronically Signed   By: Yetta Glassman M.D.   On: 05/12/2022 14:24   DG Chest Port 1 View  Result Date: 05/12/2022 CLINICAL DATA:  sob EXAM: PORTABLE CHEST - 1 VIEW COMPARISON:  01/22/2022 FINDINGS: Low lung volumes with crowding of perihilar bronchovascular structures. No new infiltrate or overt edema. Heart size and mediastinal contours are within normal limits. Aortic Atherosclerosis (ICD10-170.0). Blunting of costophrenic angles suggesting small effusions. Bilateral shoulder arthroplasty hardware. IMPRESSION: Low volumes with possible small effusions. Electronically Signed   By: Lucrezia Europe M.D.   On: 05/12/2022 11:39    Procedures Procedures    Medications Ordered in ED Medications  potassium chloride (KLOR-CON) packet 40 mEq (has no administration in time range)  ipratropium-albuterol (DUONEB) 0.5-2.5 (3) MG/3ML nebulizer solution 3 mL (3 mLs Nebulization Given 05/12/22 1223)  iohexol (OMNIPAQUE) 350 MG/ML injection 80 mL (80 mLs Intravenous Contrast Given 05/12/22 1356)  sodium chloride (PF) 0.9 % injection (  Given by Other 05/12/22 1402)  piperacillin-tazobactam (ZOSYN) IVPB 3.375 g (0 g Intravenous Stopped 05/12/22 1538)  albuterol (PROVENTIL) (2.5 MG/3ML) 0.083% nebulizer solution 5 mg (5 mg Nebulization Given 05/12/22 1545)    ED Course/ Medical Decision Making/ A&P                             Medical Decision Making Amount and/or Complexity of Data Reviewed Labs: ordered. Radiology: ordered.  Risk Prescription drug management. Decision regarding hospitalization.   This patient presents to the ED for concern of sob, this involves an extensive number of treatment options, and is a complaint that carries with it a high risk of complications and morbidity.  The differential diagnosis includes pna, chf, covid, bronchitis, PE   Co morbidities that complicate the  patient evaluation  hld, gerd, oa, and PE (on Eliquis)   Additional history obtained:  Additional history obtained from epic chart review External records from outside source obtained and reviewed including EMS report   Lab Tests:  I Ordered, and personally interpreted labs.  The pertinent results include:  cbc with wbc low at 3.9, bnp 66, lactic 1.8, covid neg, bmp with k low at 3.2   Imaging Studies ordered:  I ordered imaging studies including cxr and ct chest  I independently visualized  and interpreted imaging which showed  CXR: Low volumes with possible small effusions.  CT chest: No evidence of pulmonary embolus, although evaluation of the  segmental and subsegmental pulmonary arteries is limited due to  streak and respiratory motion artifact.  2. Large hiatal hernia with intrathoracic stomach.  3. Unchanged mild lower lung predominant reticular opacities with  associated traction bronchiectasis, findings can be seen in the  setting of interstitial lung disease.  4. Small amount of debris seen in the right mainstem bronchus and  right lower lobe, consistent with aspiration.  5. Expiratory phase imaging with excessive dynamic airway collapse.  6. Aortic Atherosclerosis (ICD10-I70.0).   I agree with the radiologist interpretation   Cardiac Monitoring:  The patient was maintained on a cardiac monitor.  I personally viewed and interpreted the cardiac monitored which showed an underlying rhythm of: nsr   Medicines ordered and prescription drug management:  I ordered medication including zosyn  for aspiration pna  Reevaluation of the patient after these medicines showed that the patient improved I have reviewed the patients home medicines and have made adjustments as needed   Test Considered:  ct   Critical Interventions:  Nebs/abx   Consultations Obtained:  I requested consultation with the hospitalist (Dr. Rogers Blocker),  and discussed lab and imaging findings  as well as pertinent plan - she will admit   Problem List / ED Course:  Aspiration pneumonia:  Pt has been on levaquin and not improving.  Zosyn started.  Pt's sats are in the mid-90s on 2L which was started on Sunday.  Nebs given which did help. Hypokalemia:  potassium given   Reevaluation:  After the interventions noted above, I reevaluated the patient and found that they have :improved   Social Determinants of Health:  snf   Dispostion:  After consideration of the diagnostic results and the patients response to treatment, I feel that the patent would benefit from admission.    CRITICAL CARE Performed by: Isla Pence   Total critical care time: 30 minutes  Critical care time was exclusive of separately billable procedures and treating other patients.  Critical care was necessary to treat or prevent imminent or life-threatening deterioration.  Critical care was time spent personally by me on the following activities: development of treatment plan with patient and/or surrogate as well as nursing, discussions with consultants, evaluation of patient's response to treatment, examination of patient, obtaining history from patient or surrogate, ordering and performing treatments and interventions, ordering and review of laboratory studies, ordering and review of radiographic studies, pulse oximetry and re-evaluation of patient's condition.        Final Clinical Impression(s) / ED Diagnoses Final diagnoses:  Aspiration pneumonia of right lower lobe, unspecified aspiration pneumonia type ([place])  Hiatal hernia  Failure to thrive in adult  Hypokalemia    Rx / DC Orders ED Discharge Orders     None         Isla Pence, MD 05/12/22 1554

## 2022-05-12 NOTE — ED Triage Notes (Signed)
Patient brought in from [facility] with c/o SOB. Per EMS patient was diagnosed with pneumonia on Sunday and was started on PO ABX but has had worsening SOB. 150/90 80 20 2L [place]

## 2022-05-13 ENCOUNTER — Encounter (HOSPITAL_COMMUNITY): Payer: Self-pay | Admitting: Internal Medicine

## 2022-05-13 DIAGNOSIS — E43 Unspecified severe protein-calorie malnutrition: Secondary | ICD-10-CM | POA: Diagnosis present

## 2022-05-13 DIAGNOSIS — Z7401 Bed confinement status: Secondary | ICD-10-CM | POA: Diagnosis not present

## 2022-05-13 DIAGNOSIS — L89154 Pressure ulcer of sacral region, stage 4: Secondary | ICD-10-CM | POA: Diagnosis present

## 2022-05-13 DIAGNOSIS — Z6831 Body mass index (BMI) 31.0-31.9, adult: Secondary | ICD-10-CM | POA: Diagnosis not present

## 2022-05-13 DIAGNOSIS — F419 Anxiety disorder, unspecified: Secondary | ICD-10-CM | POA: Diagnosis present

## 2022-05-13 DIAGNOSIS — R4589 Other symptoms and signs involving emotional state: Secondary | ICD-10-CM | POA: Diagnosis not present

## 2022-05-13 DIAGNOSIS — Z66 Do not resuscitate: Secondary | ICD-10-CM

## 2022-05-13 DIAGNOSIS — R0602 Shortness of breath: Secondary | ICD-10-CM

## 2022-05-13 DIAGNOSIS — Z79899 Other long term (current) drug therapy: Secondary | ICD-10-CM

## 2022-05-13 DIAGNOSIS — Z833 Family history of diabetes mellitus: Secondary | ICD-10-CM | POA: Diagnosis not present

## 2022-05-13 DIAGNOSIS — Z7901 Long term (current) use of anticoagulants: Secondary | ICD-10-CM | POA: Diagnosis not present

## 2022-05-13 DIAGNOSIS — Z7189 Other specified counseling: Secondary | ICD-10-CM

## 2022-05-13 DIAGNOSIS — E876 Hypokalemia: Secondary | ICD-10-CM | POA: Diagnosis present

## 2022-05-13 DIAGNOSIS — D72819 Decreased white blood cell count, unspecified: Secondary | ICD-10-CM | POA: Diagnosis present

## 2022-05-13 DIAGNOSIS — I10 Essential (primary) hypertension: Secondary | ICD-10-CM | POA: Diagnosis present

## 2022-05-13 DIAGNOSIS — G2581 Restless legs syndrome: Secondary | ICD-10-CM | POA: Diagnosis present

## 2022-05-13 DIAGNOSIS — Z515 Encounter for palliative care: Secondary | ICD-10-CM

## 2022-05-13 DIAGNOSIS — K449 Diaphragmatic hernia without obstruction or gangrene: Secondary | ICD-10-CM | POA: Diagnosis present

## 2022-05-13 DIAGNOSIS — J101 Influenza due to other identified influenza virus with other respiratory manifestations: Secondary | ICD-10-CM

## 2022-05-13 DIAGNOSIS — J9601 Acute respiratory failure with hypoxia: Secondary | ICD-10-CM | POA: Diagnosis present

## 2022-05-13 DIAGNOSIS — R54 Age-related physical debility: Secondary | ICD-10-CM | POA: Diagnosis present

## 2022-05-13 DIAGNOSIS — J690 Pneumonitis due to inhalation of food and vomit: Secondary | ICD-10-CM

## 2022-05-13 DIAGNOSIS — R4182 Altered mental status, unspecified: Secondary | ICD-10-CM | POA: Diagnosis not present

## 2022-05-13 DIAGNOSIS — R52 Pain, unspecified: Secondary | ICD-10-CM | POA: Diagnosis not present

## 2022-05-13 DIAGNOSIS — E785 Hyperlipidemia, unspecified: Secondary | ICD-10-CM | POA: Diagnosis present

## 2022-05-13 DIAGNOSIS — M1990 Unspecified osteoarthritis, unspecified site: Secondary | ICD-10-CM | POA: Diagnosis not present

## 2022-05-13 DIAGNOSIS — M159 Polyosteoarthritis, unspecified: Secondary | ICD-10-CM | POA: Diagnosis present

## 2022-05-13 DIAGNOSIS — R627 Adult failure to thrive: Secondary | ICD-10-CM

## 2022-05-13 DIAGNOSIS — J1001 Influenza due to other identified influenza virus with the same other identified influenza virus pneumonia: Secondary | ICD-10-CM | POA: Diagnosis present

## 2022-05-13 DIAGNOSIS — Z1152 Encounter for screening for COVID-19: Secondary | ICD-10-CM | POA: Diagnosis not present

## 2022-05-13 DIAGNOSIS — Z86711 Personal history of pulmonary embolism: Secondary | ICD-10-CM | POA: Diagnosis not present

## 2022-05-13 LAB — BLOOD CULTURE ID PANEL (REFLEXED) - BCID2

## 2022-05-13 LAB — BASIC METABOLIC PANEL
Anion gap: 9 (ref 5–15)
BUN: 15 mg/dL (ref 8–23)
CO2: 20 mmol/L — ABNORMAL LOW (ref 22–32)
Calcium: 7.7 mg/dL — ABNORMAL LOW (ref 8.9–10.3)
Chloride: 106 mmol/L (ref 98–111)
Creatinine, Ser: 0.37 mg/dL — ABNORMAL LOW (ref 0.44–1.00)
GFR, Estimated: >60 mL/min (ref >60)
Glucose, Bld: 80 mg/dL (ref 70–99)
Potassium: 4 mmol/L (ref 3.5–5.1)
Sodium: 135 mmol/L (ref 135–145)

## 2022-05-13 LAB — RESPIRATORY PANEL BY PCR

## 2022-05-13 LAB — CBC
HCT: 37.3 % (ref 36.0–46.0)
Hemoglobin: 11.3 g/dL — ABNORMAL LOW (ref 12.0–15.0)
MCH: 25.9 pg — ABNORMAL LOW (ref 26.0–34.0)
MCHC: 30.3 g/dL (ref 30.0–36.0)
MCV: 85.4 fL (ref 80.0–100.0)
Platelets: 297 10*3/uL (ref 150–400)
RBC: 4.37 MIL/uL (ref 3.87–5.11)
RDW: 17.7 % — ABNORMAL HIGH (ref 11.5–15.5)
WBC: 3 10*3/uL — ABNORMAL LOW (ref 4.0–10.5)
nRBC: 0 % (ref 0.0–0.2)

## 2022-05-13 LAB — PROCALCITONIN: Procalcitonin: <0.1 ng/mL

## 2022-05-13 MED ORDER — GLYCOPYRROLATE 0.2 MG/ML IJ SOLN
0.2000 mg | INTRAMUSCULAR | Status: DC | PRN
  Administered 2022-05-13 – 2022-05-14 (×2): 0.2 mg via INTRAVENOUS
  Filled 2022-05-13 (×3): qty 1

## 2022-05-13 MED ORDER — LORAZEPAM 1 MG PO TABS
1.0000 mg | ORAL_TABLET | ORAL | Status: DC | PRN
  Administered 2022-05-13: 1 mg via ORAL
  Filled 2022-05-13: qty 1

## 2022-05-13 MED ORDER — HALOPERIDOL LACTATE 5 MG/ML IJ SOLN: 0.5000 mg | INTRAMUSCULAR | Status: DC | PRN

## 2022-05-13 MED ORDER — GUAIFENESIN-DM 100-10 MG/5ML PO SYRP
5.0000 mL | ORAL_SOLUTION | ORAL | Status: DC | PRN
  Filled 2022-05-13: qty 10

## 2022-05-13 MED ORDER — HALOPERIDOL 0.5 MG PO TABS: 0.5000 mg | ORAL_TABLET | ORAL | Status: DC | PRN

## 2022-05-13 MED ORDER — OSELTAMIVIR PHOSPHATE 6 MG/ML PO SUSR: 75.0000 mg | Freq: Every day | ORAL | Status: DC

## 2022-05-13 MED ORDER — GLYCOPYRROLATE 1 MG PO TABS: 1.0000 mg | ORAL_TABLET | ORAL | Status: DC | PRN

## 2022-05-13 MED ORDER — GLYCOPYRROLATE 0.2 MG/ML IJ SOLN
0.2000 mg | INTRAMUSCULAR | Status: DC | PRN
  Filled 2022-05-13: qty 1

## 2022-05-13 MED ORDER — MORPHINE SULFATE (PF) 2 MG/ML IV SOLN
2.0000 mg | INTRAVENOUS | Status: DC | PRN
  Administered 2022-05-13 – 2022-05-14 (×2): 2 mg via INTRAVENOUS
  Filled 2022-05-13 (×2): qty 1

## 2022-05-13 MED ORDER — OSELTAMIVIR PHOSPHATE 6 MG/ML PO SUSR: 30.0000 mg | Freq: Two times a day (BID) | ORAL | Status: DC

## 2022-05-13 MED ORDER — LORAZEPAM 2 MG/ML PO CONC: 1.0000 mg | ORAL | Status: DC | PRN

## 2022-05-13 MED ORDER — BIOTENE DRY MOUTH MT LIQD: 15.0000 mL | OROMUCOSAL | Status: DC | PRN

## 2022-05-13 MED ORDER — OSELTAMIVIR PHOSPHATE 6 MG/ML PO SUSR
75.0000 mg | Freq: Once | ORAL | Status: DC
  Filled 2022-05-13: qty 12.5

## 2022-05-13 MED ORDER — POLYVINYL ALCOHOL 1.4 % OP SOLN: 1.0000 [drp] | Freq: Four times a day (QID) | OPHTHALMIC | Status: DC | PRN

## 2022-05-13 MED ORDER — HALOPERIDOL LACTATE 2 MG/ML PO CONC: 0.5000 mg | ORAL | Status: DC | PRN

## 2022-05-13 NOTE — Consult Note (Signed)
Consultation Note Date: 05/13/2022   Patient Name: [name]  DOB: 06-14-1927  MRN: [id number]  Age / Sex: 89 y.o., female   PCP: Eulas Post, MD Referring Physician: Caren Griffins, MD  Reason for Consultation: Establishing goals of care and symptom management     Chief Complaint/History of Present Illness:   Patient is a 89 year old female with a past medical history of chronic back pain, GERD, hyperlipidemia, PE on Eliquis, hypertension, hyperlipidemia, and bedbound status with known stage IV sacral ulcer who was admitted on 05/12/2022 for management of coughing and weakness.  Patient was residing at assisted living facility prior to admission.  Imaging on admission showed signs of aspiration pneumonia and patient also found to be influenza A positive.  Patient admitted for management of acute hypoxic respiratory failure in the setting.  ------------------------------------------------------------------------------------------------------------- Advance Care Planning Conversation  Pertinent diagnosis:  PE on Eliquis, bedbound status with known stage IV sacral ulcer, aspiration PNA, influenza A  The patient and/or family consented to a voluntary [place]. Individuals present for the conversation: Patient lethargic laying in bed. Spoke with NOK/daughter at bedside. Palliative provider present for entire conversation.   Summary of the conversation:  Presented to bedside to meet with patient.  Patient laying comfortably in bed and will occasionally awaken to voice.  Patient appears pale with increased work of breathing noted. Able to discuss medical care with patient's daughter and patient's caregiver who are both at bedside.  Introduced myself and the role of the palliative medicine team.  Daughter and caregiver able to update me on patient's continued decline since December, when she was moved into assisted living facility.  Patient now has a  bedbound status with stage IV sacral ulcer.  Patient has lost 25-30 pounds within the past few months.  Patient is not eating or drinking much at all.  Patient is withdrawn and not interacting with people.  Offered emotional support via active listening during conversation.  Able to then discussed possible pathways for medical care moving forward.  Daughter noted that patient would want quality time over quantity of time.  Daughter does not feel that patient has had good quality of life for the past few months.  Based on daughter's description, sounds like patient is medically deteriorating and reaching the end of her life.  Discussed possible transition to comfort focused care at this time. Discussed in detail what this transition would entail such as discontinuing medication not focused on comfort, imaging, lab work, and IVFs and instead providing medications for pain, dyspnea, and agitation. Daughter agreeing with transition to comfort focused care at this time as this is what the patient would want, to be allowed comfort at the end of life.   Discussed possible avenues to support continued comfort care such as return to SNF with hospice vs inpatient hospice. Going to transition to comfort care at this time to determine what patient's requirements will be to achieve comfort to determine next best support step. Daughter in agreement with this plan. All questions answered at that time. Provided emotional support.  Did speak with patient to update her on conversation as well and patient though barely speaking is able to state that she agrees with being comfortable at the end of her life.   Outcome of the conversations and/or documents completed:  Transition to comfort focused care at this time.   I spent 29 minutes providing separately identifiable ACP services with the patient and/or surrogate decision maker in a voluntary,  in-person conversation discussing the patient's wishes and goals as detailed in  the above note.  [name], DO Palliative Care Provider  -------------------------------------------------------------------------------------------------------------  Updated care team regarding discussion.   Primary Diagnoses  Present on Admission:  (Resolved) Hyperlipidemia  Hypertension  History of pulmonary embolus (PE)  (Resolved) GERD  RLS (restless legs syndrome)  Acute respiratory failure with hypoxia secondary to aspiration pneumonia   Past Medical History:  Diagnosis Date   BACK PAIN, LUMBAR, CHRONIC 02/20/2010   Cardiomegaly 03/16/2010   COLONIC POLYPS 02/20/2010   DIVERTICULITIS, HX OF 02/20/2010   GERD 02/20/2010   GOITER 02/20/2010   HYPERLIPIDEMIA 02/20/2010   OSTEOARTHRITIS, MULTIPLE JOINTS 02/20/2010   URINARY INCONTINENCE 02/20/2010   Social History   Socioeconomic History   Marital status: Widowed    Spouse name: Not on file   Number of children: Not on file   Years of education: Not on file   Highest education level: Not on file  Occupational History   Not on file  Tobacco Use   Smoking status: Never   Smokeless tobacco: Never  Vaping Use   Vaping Use: Never used  Substance and Sexual Activity   Alcohol use: No   Drug use: No   Sexual activity: Not on file  Other Topics Concern   Not on file  Social History Narrative   Lives alone   In home assistance throughout day    Social Determinants of Health   Financial Resource Strain: Low Risk  (04/30/2021)   Overall Financial Resource Strain (CARDIA)    Difficulty of Paying Living Expenses: Not hard at all  Food Insecurity: No Food Insecurity (05/12/2022)   Hunger Vital Sign    Worried About Running Out of Food in the Last Year: Never true    [place] in the Last Year: Never true  Transportation Needs: No Transportation Needs (05/12/2022)   PRAPARE - Hydrologist (Medical): No    Lack of Transportation (Non-Medical): No  Physical Activity: Insufficiently Active  (04/30/2021)   Exercise Vital Sign    Days of Exercise per Week: 1 day    Minutes of Exercise per Session: 20 min  Stress: No Stress Concern Present (04/30/2021)   [place]    Feeling of Stress : Not at all  Social Connections: Moderately Integrated (04/30/2021)   Social Connection and Isolation Panel [NHANES]    Frequency of Communication with Friends and Family: More than three times a week    Frequency of Social Gatherings with Friends and Family: More than three times a week    Attends Religious Services: More than 4 times per year    Active Member of [employer] or Organizations: Yes    Attends Archivist Meetings: More than 4 times per year    Marital Status: Widowed   Family History  Problem Relation Age of Onset   Cancer Mother        ovarian   Hyperlipidemia Mother    Diabetes Mother        type ll   Scheduled Meds:  apixaban  5 mg Oral BID   guaiFENesin  600 mg Oral BID   oseltamivir  75 mg Oral Once   Followed by   [START ON 05/14/2022] oseltamivir  30 mg Oral BID   Continuous Infusions:  piperacillin-tazobactam (ZOSYN)  IV 3.375 g (05/13/22 0644)   PRN Meds:.acetaminophen **OR** acetaminophen, albuterol, ondansetron **OR** ondansetron (ZOFRAN) IV,  pramipexole No Known Allergies CBC:    Component Value Date/Time   WBC 3.0 (L) 05/13/2022 0527   HGB 11.3 (L) 05/13/2022 0527   HCT 37.3 05/13/2022 0527   PLT 297 05/13/2022 0527   MCV 85.4 05/13/2022 0527   NEUTROABS 1.9 05/12/2022 1140   LYMPHSABS 1.4 05/12/2022 1140   MONOABS 0.5 05/12/2022 1140   EOSABS 0.0 05/12/2022 1140   BASOSABS 0.0 05/12/2022 1140   Comprehensive Metabolic Panel:    Component Value Date/Time   NA 135 05/13/2022 0527   K 4.0 05/13/2022 0527   CL 106 05/13/2022 0527   CO2 20 (L) 05/13/2022 0527   BUN 15 05/13/2022 0527   CREATININE 0.37 (L) 05/13/2022 0527   GLUCOSE 80 05/13/2022 0527   CALCIUM 7.7 (L)  05/13/2022 0527   AST 36 01/22/2022 1859   ALT 24 01/22/2022 1859   ALKPHOS 57 01/22/2022 1859   BILITOT 0.5 01/22/2022 1859   PROT 6.9 01/22/2022 1859   ALBUMIN 3.3 (L) 01/22/2022 1859    Physical Exam: Vital Signs: BP 129/67 (BP Location: Right Arm)   Pulse 89   Temp (!) 100.7 F (38.2 C) (Oral)   Resp 20   Ht 5\' 1"  (1.549 m)   Wt 76 kg   SpO2 97%   BMI 31.66 kg/m  SpO2: SpO2: 97 % O2 Device: O2 Device: Nasal Cannula O2 Flow Rate: O2 Flow Rate (L/min): 2 L/min Intake/output summary:  Intake/Output Summary (Last 24 hours) at 05/13/2022 [id number] Last data filed at 05/13/2022 0600 Gross per 24 hour  Intake 722.8 ml  Output --  Net 722.8 ml   LBM: Last BM Date : 05/12/22 Baseline Weight: Weight: 76 kg Most recent weight: Weight: 76 kg  General: laying in bed, ill appearing, pale, increased work of breathing noted Eyes: no drainage noted HENT: dry mucous membranes Cardiovascular: RRR Respiratory: increased work of breathing noted with accessory muscle use present  Abdomen: not distended Skin: no rashes or lesions on visible skin Neuro: lethargic though will awaken at times to voice/touch          Palliative Performance Scale: 10%              Additional Data Reviewed: Recent Labs    05/12/22 1140 05/13/22 0527  WBC 3.9* 3.0*  HGB 12.1 11.3*  PLT 328 297  NA 136 135  BUN 17 15  CREATININE 0.44 0.37*    Imaging: DG Sacrum/Coccyx CLINICAL DATA:  Open wound over the sacrum and coccyx  EXAM: SACRUM AND COCCYX - 2+ VIEW  COMPARISON:  None Available.  FINDINGS: Diffuse bone demineralization. Postoperative changes with posterior fixation from L3 through S2. Fixation hardware appears intact. No significant bone lucency at the bone hardware interfaces. The sacral end of the fixation rod extends dorsally with the tip at or just below the skin surface. This may result in skin erosion. Correlate with physical examination. No evidence of acute fracture  or dislocation. SI joints and symphysis pubis are not displaced. No bone erosion or sclerosis to suggest osteomyelitis. Degenerative changes in the lower lumbar spine and hips. Residual contrast material in the renal collecting systems and in the bladder. Bladder trabeculation.  IMPRESSION: 1. No acute bony abnormalities. No radiographic evidence of osteomyelitis. 2. Postoperative changes with fixation of the lower lumbar and sacral spine. Distal fixation hardware extends at or just below the skin surface possibly resulting in skin erosion. Correlate with physical examination. 3. Degenerative changes.  Electronically Signed   By: Lucienne Capers  M.D.   On: 05/12/2022 19:17 CT Angio Chest PE W and/or Wo Contrast CLINICAL DATA:  Pulmonary embolus suspected  EXAM: CT ANGIOGRAPHY CHEST WITH CONTRAST  TECHNIQUE: Multidetector CT imaging of the chest was performed using the standard protocol during bolus administration of intravenous contrast. Multiplanar CT image reconstructions and MIPs were obtained to evaluate the vascular anatomy.  RADIATION DOSE REDUCTION: This exam was performed according to the departmental dose-optimization program which includes automated exposure control, adjustment of the mA and/or kV according to patient size and/or use of iterative reconstruction technique.  CONTRAST:  30mL OMNIPAQUE IOHEXOL 350 MG/ML SOLN  COMPARISON:  Chest CT dated January 23, 2022; CT angio dated November 15, 2018  FINDINGS: Cardiovascular: Mild cardiomegaly. No pericardial effusion. Normal caliber thoracic aorta with moderate atherosclerotic disease. No evidence of pulmonary embolus, although evaluation of the segmental and subsegmental pulmonary arteries is limited due to streak and respiratory motion artifact.  Mediastinum/Nodes: Large hiatal hernia with intrathoracic stomach. No enlarged lymph nodes seen in the chest.  Lungs/Pleura: Small amount of debris seen in  the right mainstem bronchus and right lower lobe. Expiratory phase imaging with excessive inward bulging posterior tracheal membrane. Elevation of the right hemidiaphragm. Lower lung predominant reticular opacities with associated traction bronchiectasis. Bibasilar atelectasis. No consolidation, pleural effusion or pneumothorax.  Upper Abdomen: Gallstones.  No acute abnormality.  Musculoskeletal: Bilateral total shoulder arthroplasties. No aggressive appearing osseous lesions.  Review of the MIP images confirms the above findings.  IMPRESSION: 1. No evidence of pulmonary embolus, although evaluation of the segmental and subsegmental pulmonary arteries is limited due to streak and respiratory motion artifact. 2. Large hiatal hernia with intrathoracic stomach. 3. Unchanged mild lower lung predominant reticular opacities with associated traction bronchiectasis, findings can be seen in the setting of interstitial lung disease. 4. Small amount of debris seen in the right mainstem bronchus and right lower lobe, consistent with aspiration. 5. Expiratory phase imaging with excessive dynamic airway collapse. 6. Aortic Atherosclerosis (ICD10-I70.0).  Electronically Signed   By: Yetta Glassman M.D.   On: 05/12/2022 14:24 DG Chest Port 1 View CLINICAL DATA:  sob  EXAM: PORTABLE CHEST - 1 VIEW  COMPARISON:  01/22/2022  FINDINGS: Low lung volumes with crowding of perihilar bronchovascular structures. No new infiltrate or overt edema.  Heart size and mediastinal contours are within normal limits. Aortic Atherosclerosis (ICD10-170.0).  Blunting of costophrenic angles suggesting small effusions.  Bilateral shoulder arthroplasty hardware.  IMPRESSION: Low volumes with possible small effusions.  Electronically Signed   By: Lucrezia Europe M.D.   On: 05/12/2022 11:39    I personally reviewed recent imaging.   Palliative Care Assessment and Plan Summary of Established Goals of  Care and Medical Treatment Preferences    Patient is a 89 year old female with a past medical history of chronic back pain, GERD, hyperlipidemia, PE on Eliquis, hypertension, hyperlipidemia, and bedbound status with known stage IV sacral ulcer who was admitted on 05/12/2022 for management of coughing and weakness.  Patient was residing at assisted living facility prior to admission.  Imaging on admission showed signs of aspiration pneumonia and patient also found to be influenza A positive.  Patient admitted for management of acute hypoxic respiratory failure in the setting.  # Complex medical decision making/goals of care  -Extensive conversation with patient's daughter and caregiver at bedside as described above in HPI. Patient physically present laying in bed during conversation though was lethargic and would occasionally awaken to voice or touch. After discussing possible pathways for care,  daughter decided to transition to comfort focused care at this time.   -At this time we will discontinue interventions that are no longer focused on comfort such as IV fluids, imaging, or lab work.  Will instead focus on symptom management of pain, dyspnea, and agitation in the setting of end-of-life care.  -  Code Status: DNR  Prognosis: days- short weeks based on current assessment though could be less in setting of fevers, worsening dyspnea/lethargy, and lack of po intake   # Symptom management   -Pain/Dyspnea, acute in the setting of end-of-life care                Patient was not on medications for pain previously.                               -Started IV morphine 2mg  every 1 hour as needed.  Continue to adjust based on patient's symptom burden.  If patient needing frequent dosing, may need to consider continuous infusion.                  -Anxiety/agitation, in the setting of end-of-life care                               -Started po Ativan 0.5 mg every 4 hours as needed. Continue to adjust based on  patient's symptom burden.                                 -And also has Haldol 0.5 mg every 4 hours as needed. Continue to adjust based on patient's symptom burden.                   -Secretions, in the setting of end-of-life care                               -Started glycopyrrolate as needed.   # Psycho-social/Spiritual Support:  - Support System: daughter, care giver  # Discharge Planning:  TBD. Transition to comfort focused care at this time. Either in hospital death, referral to inpatient hospice, or return to SNF with hospice depending on patient's symptom burden at the end of life.   Thank you for allowing the palliative care team to participate in the care [name].  [name], DO Palliative Care Provider PMT # [phone]  If patient remains symptomatic despite maximum doses, please call PMT at [phone] between 0700 and 1900. Outside of these hours, please call attending, as PMT does not have night coverage.  *Please note that this is a verbal dictation therefore any spelling or grammatical errors are due to the "[place] [employer]" system interpretation.

## 2022-05-13 NOTE — Progress Notes (Signed)
PHARMACY - PHYSICIAN COMMUNICATION CRITICAL VALUE ALERT - BLOOD CULTURE IDENTIFICATION (BCID)  [name] is an 89 y.o. female who presented to [facility] on 05/12/2022 with a chief complaint of cough, weakness  Assessment:  1/4 BCx bottles growing methicillin-resistant Staph Epi (likely contamination)  Name of physician (or Provider) Contacted: Zebedee Iba  Current antibiotics: none; pt currently comfort care  Changes to prescribed antibiotics recommended: continue current care; abx not within goals of care at this time Recommendations accepted by provider  Results for orders placed or performed during the hospital encounter of 05/12/22  Blood Culture ID Panel (Reflexed) (Collected: 05/12/2022 11:40 AM)  Result Value Ref Range   Enterococcus faecalis NOT DETECTED NOT DETECTED   Enterococcus Faecium NOT DETECTED NOT DETECTED   Listeria monocytogenes NOT DETECTED NOT DETECTED   Staphylococcus species DETECTED (A) NOT DETECTED   Staphylococcus aureus (BCID) NOT DETECTED NOT DETECTED   Staphylococcus epidermidis DETECTED (A) NOT DETECTED   Staphylococcus lugdunensis NOT DETECTED NOT DETECTED   Streptococcus species NOT DETECTED NOT DETECTED   Streptococcus agalactiae NOT DETECTED NOT DETECTED   Streptococcus pneumoniae NOT DETECTED NOT DETECTED   Streptococcus pyogenes NOT DETECTED NOT DETECTED   A.calcoaceticus-baumannii NOT DETECTED NOT DETECTED   Bacteroides fragilis NOT DETECTED NOT DETECTED   Enterobacterales NOT DETECTED NOT DETECTED   Enterobacter cloacae complex NOT DETECTED NOT DETECTED   Escherichia coli NOT DETECTED NOT DETECTED   Klebsiella aerogenes NOT DETECTED NOT DETECTED   Klebsiella oxytoca NOT DETECTED NOT DETECTED   Klebsiella pneumoniae NOT DETECTED NOT DETECTED   Proteus species NOT DETECTED NOT DETECTED   Salmonella species NOT DETECTED NOT DETECTED   Serratia marcescens NOT DETECTED NOT DETECTED   Haemophilus influenzae NOT DETECTED NOT DETECTED    Neisseria meningitidis NOT DETECTED NOT DETECTED   Pseudomonas aeruginosa NOT DETECTED NOT DETECTED   Stenotrophomonas maltophilia NOT DETECTED NOT DETECTED   Candida albicans NOT DETECTED NOT DETECTED   Candida auris NOT DETECTED NOT DETECTED   Candida glabrata NOT DETECTED NOT DETECTED   Candida krusei NOT DETECTED NOT DETECTED   Candida parapsilosis NOT DETECTED NOT DETECTED   Candida tropicalis NOT DETECTED NOT DETECTED   Cryptococcus neoformans/gattii NOT DETECTED NOT DETECTED   Methicillin resistance mecA/C DETECTED (A) NOT DETECTED    [name] A 05/13/2022  9:48 PM

## 2022-05-13 NOTE — Progress Notes (Signed)
Chaplain met [name] but she was very drowsy and said she felt "lousy."  Chaplain assessed for immediate spiritual needs and none were identified.  Chaplain suggested follow up at a later time and [name] agreed with this.  [address] [place], [phone]

## 2022-05-13 NOTE — Progress Notes (Signed)
PROGRESS NOTE  [name] [id number] DOB: 01-Jul-1927 DOA: 05/12/2022 PCP: Eulas Post, MD   LOS: 0 days   Brief Narrative / Interim history: This is a 89 year old female with history of chronic back pain, GERD, HLD, history of PE on Eliquis, HTN, HLD, who comes into the hospital with coughing and weakness.  She is at SNF, did a chest x-ray due to coughing and it showed pneumonia.  She was hypoxic and eventually was brought to the hospital.  Daughter mentions that patient has declined significantly in the last 4 months since December 2023, has poor p.o. intake and has lost about 30 pounds.  She is now essentially bedbound and barely mobile even in bed, not even sitting up anymore.   Subjective / 24h Interval events: Poorly responsive, very hard of hearing.  Daughter, caregiver and friend are at bedside.  She nods yes and no to questions, barely speaks.  I understand that she is not feeling good due to her breathing.  She understands that she is in the hospital with pneumonia and influenza  Assesement and Plan: Principal Problem:   Acute respiratory failure with hypoxia secondary to aspiration pneumonia Active Problems:   Hypokalemia   Failure to thrive in adult   Sacral decubitus ulcer   Hypertension   History of pulmonary embolus (PE)   RLS (restless legs syndrome)   Aspiration pneumonia (HCC)   Principal problem Goals of care -patient was admitted to the hospital with failure to thrive, progressive decline over the last 4 months.  She has been aspirating more and now has respiratory failure, aspiration pneumonia and also tested positive for influenza.  Discussed with the daughter at bedside, and she is in favor of quality over quantity, and feels like her mother does not have good quality of life currently.  She has been clearly deteriorated and now essentially bedbound and minimally interactive.  She is not having much oral intake and has been losing a significant  amount of weight.  She also has a sacral decubitus ulcer in the setting of her immobility.  Palliative care also consulted.  Focusing now more on comfort approach.  Going to assess her needs over the next day or so, to determine whether she needs residential hospice or can return to SNF with hospice.  DNR in place  Acute hypoxic respiratory failure due to aspiration pneumonia Influenza pneumonia -comfort approach currently Hypokalemia FTT Stage IV sacral decubitus ulcer, POA Severe protein calorie malnutrition Hypocalcemia Leukopenia    Scheduled Meds: Continuous Infusions: PRN Meds:.acetaminophen **OR** acetaminophen, antiseptic oral rinse, glycopyrrolate **OR** glycopyrrolate **OR** glycopyrrolate, haloperidol **OR** haloperidol **OR** haloperidol lactate, LORazepam **OR** LORazepam **OR** LORazepam, morphine injection, [DISCONTINUED] ondansetron **OR** ondansetron (ZOFRAN) IV, polyvinyl alcohol  Current Outpatient Medications  Medication Instructions   acetaminophen (TYLENOL) 500 mg, Oral, Daily at bedtime   acetaminophen (TYLENOL) 650 mg, Rectal, Every 4 hours PRN   albuterol (VENTOLIN HFA) 108 (90 Base) MCG/ACT inhaler 2 puffs, Inhalation, Every 4 hours PRN   Amino Acids-Protein Hydrolys (FEEDING SUPPLEMENT, PRO-STAT SUGAR FREE 64,) LIQD 30 mLs, Oral, 2 times daily   apixaban (ELIQUIS) 5 MG TABS tablet AT THE START OF THERAPY , TAKE 2 TABLETS TWICE A DAY FOR 7 DAYS AND THEN 1 TABLET TWICE A DAY THEREAFTER   baclofen (LIORESAL) 10 mg, Oral, Daily at bedtime   carboxymethylcellulose (REFRESH PLUS) 0.5 % SOLN 1 drop, Both Eyes, 2 times daily   diclofenac Sodium (VOLTAREN) 2 g, Topical, 3 times daily   diltiazem (CARDIZEM  CD) 120 mg, Oral, Daily   FeroSul 325 mg, Oral, Daily   furosemide (LASIX) 20 MG tablet Take 1 tablet once daily as needed for edema   ipratropium-albuterol (DUONEB) 0.5-2.5 (3) MG/3ML SOLN 3 mLs, Nebulization, Every 6 hours   Lactobacillus-Inulin (CULTURELLE  DIGESTIVE HEALTH) CAPS 1 capsule, Oral, 2 times daily before meals   levofloxacin (LEVAQUIN) 750 mg, Oral, Daily   losartan (COZAAR) 100 mg, Oral, Daily   melatonin 5 mg, Oral, Daily at bedtime   mirtazapine (REMERON) 15 mg, Oral, Daily at bedtime   Mucinex 600 mg, Oral, Every 12 hours PRN   Multiple Vitamin (MULITIVITAMIN WITH MINERALS) TABS 1 tablet, Oral, Daily   nystatin (MYCOSTATIN/NYSTOP) powder 1 Application, Topical, 2 times daily   nystatin cream (MYCOSTATIN) 1 Application, Topical, 2 times daily   pramipexole (MIRAPEX) 0.125 MG tablet TAKE 1 TO 2 TABLETS BY MOUTH AT NIGHT AS NEEDED FOR RESTLESS LEGS OR SYMPTOMS   predniSONE (DELTASONE) 40 mg, Oral, Daily   senna-docusate (SENOKOT-S) 8.6-50 MG tablet 1 tablet, Oral, At bedtime PRN   sodium chloride 1 g, Oral, 3 times daily   sodium hypochlorite (DAKIN'S 1/2 STRENGTH) external solution 1 Application, Irrigation, 2 times daily   Spacer/Aero-Holding Chambers (AEROCHAMBER MV) inhaler Use as instructed   traMADol (ULTRAM) 50 mg, Oral, Every 12 hours PRN   Triclosan [id number] % LIQD 1 Application, Apply externally, Daily    Diet Orders (From admission, onward)     Start     Ordered   05/13/22 1052  Diet regular Room service appropriate? Yes; Fluid consistency: Thin  Diet effective now       Question Answer Comment  Room service appropriate? Yes   Fluid consistency: Thin      05/13/22 1051            DVT prophylaxis:    Lab Results  Component Value Date   PLT 297 05/13/2022      Code Status: DNR  Family Communication: Daughter at bedside  Status is: Observation  The patient will require care spanning > 2 midnights and should be moved to inpatient because: Transition to comfort, may need residential hospice  Level of care: Progressive  Consultants:  Palliative care  Objective: Vitals:   05/13/22 0239 05/13/22 0255 05/13/22 0647 05/13/22 0759  BP: 135/71  129/67   Pulse: 99  89   Resp: 18  20   Temp: (!) 100.8  F (38.2 C) 98.7 F (37.1 C) (!) 100.7 F (38.2 C)   TempSrc: Oral Oral Oral   SpO2: 96%  96% 97%  Weight:      Height:        Intake/Output Summary (Last 24 hours) at 05/13/2022 1101 Last data filed at 05/13/2022 0600 Gross per 24 hour  Intake 722.8 ml  Output --  Net 722.8 ml   Wt Readings from Last 3 Encounters:  05/12/22 76 kg  04/30/21 74.8 kg  04/24/20 71.3 kg    Examination:  Constitutional: Lethargic Eyes: no scleral icterus ENMT: Mucous membranes are moist.  Neck: normal, supple Respiratory: Rhonchorous breath sounds bilaterally Cardiovascular: Regular rate and rhythm Musculoskeletal: Contracted Neurologic: non focal  Data Reviewed: I have independently reviewed following labs and imaging studies   CBC Recent Labs  Lab 05/12/22 1140 05/13/22 0527  WBC 3.9* 3.0*  HGB 12.1 11.3*  HCT 39.2 37.3  PLT 328 297  MCV 83.2 85.4  MCH 25.7* 25.9*  MCHC 30.9 30.3  RDW 17.6* 17.7*  LYMPHSABS 1.4  --  MONOABS 0.5  --   EOSABS 0.0  --   BASOSABS 0.0  --     Recent Labs  Lab 05/12/22 1140 05/12/22 1631 05/12/22 2050 05/13/22 0527  NA 136  --   --  135  K 3.2*  --   --  4.0  CL 104  --   --  106  CO2 26  --   --  20*  GLUCOSE 103*  --   --  80  BUN 17  --   --  15  CREATININE 0.44  --   --  0.37*  CALCIUM 8.3*  --   --  7.7*  MG  --  1.8  --   --   PROCALCITON  --  <0.10  --  <0.10  LATICACIDVEN 1.8 1.8  --   --   TSH  --   --  2.282  --   BNP 66.0  --   --   --     ------------------------------------------------------------------------------------------------------------------ No results for input(s): "CHOL", "HDL", "LDLCALC", "TRIG", "CHOLHDL", "LDLDIRECT" in the last 72 hours.  No results found for: "HGBA1C" ------------------------------------------------------------------------------------------------------------------ Recent Labs    05/12/22 2050  TSH 2.282    Cardiac Enzymes No results for input(s): "CKMB", "TROPONINI",  "MYOGLOBIN" in the last 168 hours.  Invalid input(s): "CK" ------------------------------------------------------------------------------------------------------------------    Component Value Date/Time   BNP 66.0 05/12/2022 1140    CBG: No results for input(s): "GLUCAP" in the last 168 hours.  Recent Results (from the past 240 hour(s))  Culture, blood (routine x 2)     Status: None (Preliminary result)   Collection Time: 05/12/22 11:40 AM   Specimen: BLOOD  Result Value Ref Range Status   Specimen Description   Final    BLOOD RIGHT ANTECUBITAL Performed at [facility] [address] [place], [place] [address]    Special Requests   Final    BOTTLES DRAWN AEROBIC AND ANAEROBIC Blood Culture adequate volume Performed at [facility] [address], [place], [place] [address]    Culture   Final    NO GROWTH < 24 HOURS Performed at [facility] [address], [place], [place] [address]    Report Status PENDING  Incomplete  SARS Coronavirus 2 by RT PCR (hospital order, performed in [facility] hospital lab) *cepheid single result test*     Status: None   Collection Time: 05/12/22 11:40 AM   Specimen: Nasal Swab  Result Value Ref Range Status   SARS Coronavirus 2 by RT PCR NEGATIVE NEGATIVE Final    Comment: (NOTE) SARS-CoV-2 target nucleic acids are NOT DETECTED.  The SARS-CoV-2 RNA is generally detectable in upper and lower respiratory specimens during the acute phase of infection. The lowest concentration of SARS-CoV-2 viral copies this assay can detect is 250 copies / mL. A negative result does not preclude SARS-CoV-2 infection and should not be used as the sole basis for treatment or other patient management decisions.  A negative result may occur with improper specimen collection / handling, submission of specimen other than nasopharyngeal swab, presence of viral mutation(s) within the areas targeted by this assay, and  inadequate number of viral copies (<250 copies / mL). A negative result must be combined with clinical observations, patient history, and epidemiological information.  Fact Sheet for Patients:   https://www.patel.info/  Fact Sheet for Healthcare Providers: https://hall.com/  This test is not yet approved or  cleared by the [place] FDA and has been  authorized for detection and/or diagnosis of SARS-CoV-2 by FDA under an Emergency Use Authorization (EUA).  This EUA will remain in effect (meaning this test can be used) for the duration of the COVID-19 declaration under Section 564(b)(1) of the Act, 21 U.S.C. section 360bbb-3(b)(1), unless the authorization is terminated or revoked sooner.  Performed at [facility], [place] [address] [place], [place] [address]   Culture, blood (routine x 2)     Status: None (Preliminary result)   Collection Time: 05/12/22 11:47 AM   Specimen: BLOOD  Result Value Ref Range Status   Specimen Description   Final    BLOOD SITE NOT SPECIFIED Performed at [facility] [address], [place], [place] [address]    Special Requests   Final    BOTTLES DRAWN AEROBIC AND ANAEROBIC Blood Culture results may not be optimal due to an excessive volume of blood received in culture bottles Performed at [facility] [address] [place], [place] [address]    Culture   Final    NO GROWTH < 24 HOURS Performed at [facility] [address] [place], [place] [address]    Report Status PENDING  Incomplete  Respiratory (~20 pathogens) panel by PCR     Status: Abnormal   Collection Time: 05/12/22 10:15 PM   Specimen: Nasopharyngeal Swab; Respiratory  Result Value Ref Range Status   Adenovirus NOT DETECTED NOT DETECTED Final   Coronavirus 229E NOT DETECTED NOT DETECTED Final    Comment: (NOTE) The Coronavirus on the Respiratory Panel, DOES NOT test for  the novel  Coronavirus (2019 nCoV)    Coronavirus HKU1 NOT DETECTED NOT DETECTED Final   Coronavirus NL63 NOT DETECTED NOT DETECTED Final   Coronavirus OC43 NOT DETECTED NOT DETECTED Final   Metapneumovirus NOT DETECTED NOT DETECTED Final   Rhinovirus / Enterovirus NOT DETECTED NOT DETECTED Final   Influenza A H3 DETECTED (A) NOT DETECTED Final   Influenza B NOT DETECTED NOT DETECTED Final   Parainfluenza Virus 1 NOT DETECTED NOT DETECTED Final   Parainfluenza Virus 2 NOT DETECTED NOT DETECTED Final   Parainfluenza Virus 3 NOT DETECTED NOT DETECTED Final   Parainfluenza Virus 4 NOT DETECTED NOT DETECTED Final   Respiratory Syncytial Virus NOT DETECTED NOT DETECTED Final   Bordetella pertussis NOT DETECTED NOT DETECTED Final   Bordetella Parapertussis NOT DETECTED NOT DETECTED Final   Chlamydophila pneumoniae NOT DETECTED NOT DETECTED Final   Mycoplasma pneumoniae NOT DETECTED NOT DETECTED Final    Comment: Performed at [facility], [place]. [address] [place], [place] [address]     Radiology Studies: DG Sacrum/Coccyx  Result Date: 05/12/2022 CLINICAL DATA:  Open wound over the sacrum and coccyx EXAM: SACRUM AND COCCYX - 2+ VIEW COMPARISON:  None Available. FINDINGS: Diffuse bone demineralization. Postoperative changes with posterior fixation from L3 through S2. Fixation hardware appears intact. No significant bone lucency at the bone hardware interfaces. The sacral end of the fixation rod extends dorsally with the tip at or just below the skin surface. This may result in skin erosion. Correlate with physical examination. No evidence of acute fracture or dislocation. SI joints and symphysis pubis are not displaced. No bone erosion or sclerosis to suggest osteomyelitis. Degenerative changes in the lower lumbar spine and hips. Residual contrast material in the renal collecting systems and in the bladder. Bladder trabeculation. IMPRESSION: 1. No acute bony abnormalities. No radiographic  evidence of osteomyelitis. 2. Postoperative changes with fixation of  the lower lumbar and sacral spine. Distal fixation hardware extends at or just below the skin surface possibly resulting in skin erosion. Correlate with physical examination. 3. Degenerative changes. Electronically Signed   By: Lucienne Capers M.D.   On: 05/12/2022 19:17   CT Angio Chest PE W and/or Wo Contrast  Result Date: 05/12/2022 CLINICAL DATA:  Pulmonary embolus suspected EXAM: CT ANGIOGRAPHY CHEST WITH CONTRAST TECHNIQUE: Multidetector CT imaging of the chest was performed using the standard protocol during bolus administration of intravenous contrast. Multiplanar CT image reconstructions and MIPs were obtained to evaluate the vascular anatomy. RADIATION DOSE REDUCTION: This exam was performed according to the departmental dose-optimization program which includes automated exposure control, adjustment of the mA and/or kV according to patient size and/or use of iterative reconstruction technique. CONTRAST:  69mL OMNIPAQUE IOHEXOL 350 MG/ML SOLN COMPARISON:  Chest CT dated January 23, 2022; CT angio dated November 15, 2018 FINDINGS: Cardiovascular: Mild cardiomegaly. No pericardial effusion. Normal caliber thoracic aorta with moderate atherosclerotic disease. No evidence of pulmonary embolus, although evaluation of the segmental and subsegmental pulmonary arteries is limited due to streak and respiratory motion artifact. Mediastinum/Nodes: Large hiatal hernia with intrathoracic stomach. No enlarged lymph nodes seen in the chest. Lungs/Pleura: Small amount of debris seen in the right mainstem bronchus and right lower lobe. Expiratory phase imaging with excessive inward bulging posterior tracheal membrane. Elevation of the right hemidiaphragm. Lower lung predominant reticular opacities with associated traction bronchiectasis. Bibasilar atelectasis. No consolidation, pleural effusion or pneumothorax. Upper Abdomen: Gallstones.  No acute  abnormality. Musculoskeletal: Bilateral total shoulder arthroplasties. No aggressive appearing osseous lesions. Review of the MIP images confirms the above findings. IMPRESSION: 1. No evidence of pulmonary embolus, although evaluation of the segmental and subsegmental pulmonary arteries is limited due to streak and respiratory motion artifact. 2. Large hiatal hernia with intrathoracic stomach. 3. Unchanged mild lower lung predominant reticular opacities with associated traction bronchiectasis, findings can be seen in the setting of interstitial lung disease. 4. Small amount of debris seen in the right mainstem bronchus and right lower lobe, consistent with aspiration. 5. Expiratory phase imaging with excessive dynamic airway collapse. 6. Aortic Atherosclerosis (ICD10-I70.0). Electronically Signed   By: Yetta Glassman M.D.   On: 05/12/2022 14:24   DG Chest Port 1 View  Result Date: 05/12/2022 CLINICAL DATA:  sob EXAM: PORTABLE CHEST - 1 VIEW COMPARISON:  01/22/2022 FINDINGS: Low lung volumes with crowding of perihilar bronchovascular structures. No new infiltrate or overt edema. Heart size and mediastinal contours are within normal limits. Aortic Atherosclerosis (ICD10-170.0). Blunting of costophrenic angles suggesting small effusions. Bilateral shoulder arthroplasty hardware. IMPRESSION: Low volumes with possible small effusions. Electronically Signed   By: Lucrezia Europe M.D.   On: 05/12/2022 11:39     Marzetta Board, MD, PhD [facility]  Between 7 am - 7 pm I am available, please contact me via Amion (for emergencies) or Securechat (non urgent messages)  Between 7 pm - 7 am I am not available, please contact night coverage MD/APP via Amion

## 2022-05-13 NOTE — Progress Notes (Signed)
  Daily Progress Note   Patient Name: [name]       Date: 05/13/2022 DOB: [birth year]  Age: 89 y.o. MRN#: [id number] Attending Physician: Caren Griffins, MD Primary Care Physician: Eulas Post, MD Admit Date: 05/12/2022 Length of Stay: 0 days  Will place full consult not as soon as able. Discussed care with patient and daughter at bedside. Transitioning to comfort focused care at this time. Will change orders accordingly.   [name], DO Palliative Care Provider PMT # [phone]

## 2022-05-13 NOTE — Consult Note (Signed)
WOC Nurse Consult Note: Reason for Consult: Consult requested for sacrum Wound type: Chronic Stage 4 pressure injury to sacrum, imaging was negative for osteomyelitis.  Pressure Injury POA: Yes Measurement: 1X.5X.8cm, bone palpable when probed with a swab.  Wound bed: Red and moist, mod amt yellow drainage Periwound: pink dry scar tissue surrounding the wound Dressing procedure/placement/frequency: Topical treatment orders provided for bedside nurses to perform as follows to absorb drainage and provide antimicrobial benefits: Cut piece of Aquacel Kellie Simmering # [phone]) and tuck into sacrum wound Q day, using swab to fill, then cover with foam dressing.  Change foam dressing Q 3 days or PRN soiling. Please re-consult if further assistance is needed.  Thank-you,  Julien Girt MSN, [place], [place], [place], [place]

## 2022-05-13 NOTE — Evaluation (Addendum)
SLP Cancellation Note  Patient Details Name: [name] MRN: [id number] DOB: 26-Aug-1927   Cancelled treatment:       Reason Eval/Treat Not Completed: Other (comment);Fatigue/lethargy limiting ability to participate (Patient currently asleep laying on her left side.  Daughter [name] in room and reports patient with significant history of dysphagia.  [name] endorses that patient has only been consuming liquids very few solid foods.)  [name] advised she is interested in patient being comfortable and does not want to put her through any procedures.  She verbalized MD mention hospice and she appeared interested in this option.  SLP discussed comfort p.o. with [name].  Inform her that per CT chest, patient has a large hiatal hernia, which is likely contributing to dysphagia and aspiration.  Requested RN, Glenda Chroman, call SLP if patient tolerates adequately for p.o.    Kathleen Lime, MS [facility] SLP Acute Rehab Services Office [phone]   Macario Golds 05/13/2022, 8:51 AM

## 2022-05-13 NOTE — Plan of Care (Signed)
  Problem: Safety: Goal: Ability to remain free from injury will improve Outcome: Progressing   

## 2022-05-14 DIAGNOSIS — R627 Adult failure to thrive: Secondary | ICD-10-CM | POA: Diagnosis not present

## 2022-05-14 DIAGNOSIS — J690 Pneumonitis due to inhalation of food and vomit: Secondary | ICD-10-CM | POA: Diagnosis not present

## 2022-05-14 DIAGNOSIS — J9601 Acute respiratory failure with hypoxia: Secondary | ICD-10-CM | POA: Diagnosis not present

## 2022-05-14 DIAGNOSIS — Z7189 Other specified counseling: Secondary | ICD-10-CM

## 2022-05-14 DIAGNOSIS — Z515 Encounter for palliative care: Secondary | ICD-10-CM | POA: Diagnosis not present

## 2022-05-14 MED ORDER — MORPHINE SULFATE 10 MG/5ML PO SOLN: 5.0000 mg | ORAL | Status: DC | PRN

## 2022-05-14 MED ORDER — LORAZEPAM 2 MG/ML PO CONC: 1.0000 mg | ORAL | 0 refills | Status: DC | PRN

## 2022-05-14 MED ORDER — TRAMADOL HCL 50 MG PO TABS: 50.0000 mg | ORAL_TABLET | Freq: Two times a day (BID) | ORAL | 0 refills | Status: DC | PRN

## 2022-05-14 NOTE — TOC Initial Note (Signed)
Transition of Care [facility]) - Initial/Assessment Note    Patient Details  Name: [name] MRN: [id number] Date of Birth: Jan 05, 1928  Transition of Care [facility]) CM/SW Contact:    Henrietta Dine, RN Phone Number: 05/14/2022, 11:39 AM  Clinical Narrative:                 Also see progress note dated 05/14/22.  Expected Discharge Plan: [place] Barriers to Discharge: Continued Medical Work up   Patient Goals and CMS Choice Patient states their goals for this hospitalization and ongoing recovery are:: [place] per pt's dtr Joycelyn Schmid ([place]) [place] CMS Medicare.gov Compare Post Acute Care list provided to:: Other (Comment Required) (pt's dtr)        Expected Discharge Plan and Services   Discharge Planning Services: CM Consult   Living arrangements for the past 2 months: [place] [facility])                                      Prior Living Arrangements/Services Living arrangements for the past 2 months: [place] Chief of Staff) Lives with:: Facility Resident Patient language and need for interpreter reviewed:: Yes Do you feel safe going back to the place where you live?: Yes      Need for Family Participation in Patient Care: Yes (Comment) Care giver support system in place?: Yes (comment) Current home services: DME (hoyer) Criminal Activity/Legal Involvement Pertinent to Current Situation/Hospitalization: No - Comment as needed  Activities of Daily Living Home Assistive Devices/Equipment: [employer] fast streamer ADL Screening (condition at time of admission) Patient's cognitive ability adequate to safely complete daily activities?: No Is the patient deaf or have difficulty hearing?: Yes Does the patient have difficulty seeing, even when wearing glasses/contacts?: No Does the patient have difficulty concentrating, remembering, or making decisions?: No Patient able to express need for assistance with  ADLs?: Yes Does the patient have difficulty dressing or bathing?: Yes Independently performs ADLs?: No Does the patient have difficulty walking or climbing stairs?: Yes Weakness of Legs: Both Weakness of Arms/Hands: Both  Permission Sought/Granted Permission sought to share information with : Case Manager Permission granted to share information with : Yes, Verbal Permission Granted  Share Information with NAME: Lenor Coffin, RN, CM     Permission granted to share info w Relationship: Joycelyn Schmid "[name]" Doggett (dtr) [phone]     Emotional Assessment Appearance:: Appears stated age Attitude/Demeanor/Rapport: Unable to Assess Affect (typically observed): Unable to Assess Orientation: :  (unable to assess; pt unresponsive) Alcohol / Substance Use: Not Applicable Psych Involvement: No (comment)  Admission diagnosis:  Hiatal hernia [K44.9] Hypokalemia [E87.6] Failure to thrive in adult [R62.7] Acute respiratory failure with hypoxia [J96.01] Aspiration pneumonia of right lower lobe, unspecified aspiration pneumonia type [J69.0] Aspiration pneumonia [J69.0] Patient Active Problem List   Diagnosis Date Noted   Palliative care encounter 05/13/2022   End of life care 05/13/2022   DNR (do not resuscitate) 05/13/2022   Shortness of breath 05/13/2022   Pain 05/13/2022   High risk medication use 05/13/2022   Need for emotional support 05/13/2022   Goals of care, counseling/discussion 05/13/2022   Influenza A 05/13/2022   Hypokalemia 05/12/2022   Acute respiratory failure with hypoxia secondary to aspiration pneumonia 05/12/2022   Aspiration pneumonia ([place]) 05/12/2022   Failure to thrive in adult 05/12/2022   Sacral decubitus ulcer 05/12/2022   CAP (community acquired pneumonia) 01/23/2022  Community acquired pneumonia of left lower lobe of lung 01/22/2022   History of pulmonary embolus (PE) 01/22/2022   Physical debility 01/22/2022   Hyponatremia 01/22/2022   RLS (restless  legs syndrome) 08/04/2020   Hypertension 12/12/2018   Left leg weakness 04/19/2013   CARDIOMEGALY 03/16/2010   COLONIC POLYPS 02/20/2010   GOITER 02/20/2010   Osteoarthritis of multiple joints 02/20/2010   BACK PAIN, LUMBAR, CHRONIC 02/20/2010   Urinary incontinence 02/20/2010   DIVERTICULITIS, HX OF 02/20/2010   PCP:  Eulas Post, MD Pharmacy:   [place], [place] - [address] [place] [address] Phone: [phone] Fax: [phone]     Social Determinants of Health (SDOH) Social History: SDOH Screenings   Food Insecurity: No Food Insecurity (05/14/2022)  Housing: Low Risk  (05/14/2022)  Transportation Needs: No Transportation Needs (05/14/2022)  Utilities: Not At Risk (05/14/2022)  Alcohol Screen: Low Risk  (04/30/2021)  Depression (PHQ2-9): Low Risk  (04/30/2021)  Financial Resource Strain: Low Risk  (04/30/2021)  Physical Activity: Insufficiently Active (04/30/2021)  Social Connections: Moderately Integrated (04/30/2021)  Stress: No Stress Concern Present (04/30/2021)  Tobacco Use: Low Risk  (05/13/2022)   SDOH Interventions: Food Insecurity Interventions: Inpatient TOC Housing Interventions: Inpatient TOC Transportation Interventions: Inpatient TOC Utilities Interventions: Inpatient TOC   Readmission Risk Interventions    05/14/2022   11:32 AM  Readmission Risk Prevention Plan  Transportation Screening Complete  PCP or Specialist Appt within 3-5 Days Complete  HRI or [place] Complete  Social Work Consult for [place] Planning/Counseling Complete  Palliative Care Screening Complete  Medication Review Press photographer) Complete

## 2022-05-14 NOTE — Progress Notes (Signed)
Daily Progress Note   Patient Name: [name]       Date: 05/14/2022 DOB: Oct 08, 1927  Age: 89 y.o. MRN#: [id number] Attending Physician: Michelle Griffins, MD Primary Care Physician: Michelle Post, MD Admit Date: 05/12/2022 Length of Stay: 1 day  Reason for Consultation/Follow-up: Establishing goals of care and symptom management   Subjective:   CC: Patient lethargic when seen this morning.  Regarding complex medical decision making and symptom management.  Subjective:  Reviewed EMR prior to seeing patient.  At time of EMR review, patient had received IV glycopyrrolate x 2 doses and IV morphine 2 mg x 2 doses.  Presented to bedside to check on patient.  Patient's caregiver, [name], was present at bedside.  Patient laying comfortably in bed.  Patient would awaken slightly to voice though would quickly fall back asleep.  Patient denying any pain at this time.  Patient did have increased secretions heard with slightly increased work of breathing noted.  Inquired with [name] if patient's daughter, [name], would be present today she was unsure if she would.  Noted would plan to reach out to social worker to reach out to [name] regarding possible inpatient hospice referral to [facility] as this is the place [name] had requested previously.  Discussed that patient would have to be excepted to [facility] and if she is not, would then need to consider returning to SNF level of care with hospice support.  All questions answered at that time.  Thank patient and [name] for allowing me to visit today.  Updated care team regarding plan.  Objective:   Vital Signs:  BP 123/77 (BP Location: Left Arm)   Pulse 94   Temp 98 F (36.7 C) (Oral)   Resp 18   Ht 5\' 1"  (1.549 m)   Wt 76 kg   SpO2 100%   BMI 31.66 kg/m   Physical Exam: General: laying in bed, ill appearing, pale Eyes: no drainage noted HENT: dry mucous membranes Cardiovascular: RRR Respiratory: slightly increased  work of breathing noted with secretions heard Abdomen: not distended Skin: no rashes or lesions on visible skin Neuro: lethargic though will awaken at times to voice/touch   Imaging:  I personally reviewed recent imaging.   Assessment & Plan:   Assessment: Patient is a 89 year old female with a past medical history of chronic back pain, GERD, hyperlipidemia, PE on Eliquis, hypertension, hyperlipidemia, and bedbound status with known stage IV sacral ulcer who was admitted on 05/12/2022 for management of coughing and weakness.  Patient was residing at assisted living facility prior to admission.  Imaging on admission showed signs of aspiration pneumonia and patient also found to be influenza A positive.  Patient admitted for management of acute hypoxic respiratory failure in the setting.   Recommendations/Plan: # Complex medical decision making/goals of care: -Patient was transition to full comfort focused care on 05/13/2022 after extensive conversation with daughter present at bedside.  Continuing with comfort focused care at this time. -TOC consult placed for referral to inpatient hospice, specifically [facility] as requested by daughter.  If patient not excepted to [facility], would need to consider returning to SNF level of care with hospice support.                -  Code Status: DNR  Prognosis: days- short weeks based on current assessment though could be less in setting of fevers, worsening dyspnea/lethargy, lack of po intake, infection (aspiration PNA, flu, sacral ulcer) not  getting abx    # Symptom management                 -Pain/Dyspnea, acute in the setting of end-of-life care                Patient was not on medications for pain previously.                               -Continue IV morphine 2mg  every 1 hour as needed.  Continue to adjust based on patient's symptom burden.  If patient needing frequent dosing, may need to consider continuous infusion.                   -Anxiety/agitation, in the setting of end-of-life care                               -Continue po Ativan 0.5 mg every 4 hours as needed. Continue to adjust based on patient's symptom burden.                                 -Continue Haldol 0.5 mg every 4 hours as needed. Continue to adjust based on patient's symptom burden.                   -Secretions, in the setting of end-of-life care                               -Continue glycopyrrolate as needed.   # Psycho-social/Spiritual Support:  - Support System: daughter, care giver  # Discharge Planning: TBD. Inpatient hospice vs SNF care with hospice   Thank you for allowing the palliative care team to participate in the care [name].  Michelle Aus, DO Palliative Care Provider PMT # [phone]  If patient remains symptomatic despite maximum doses, please call PMT at [phone] between 0700 and 1900. Outside of these hours, please call attending, as PMT does not have night coverage.  This provider spent a total of 36 minutes providing patient's care.  Includes review of EMR, discussing care with other staff members involved in patient's medical care, obtaining relevant history and information from patient and/or patient's family, and personal review of imaging and lab work. Greater than 50% of the time was spent counseling and coordinating care related to the above assessment and plan.    *Please note that this is a verbal dictation therefore any spelling or grammatical errors are due to the "[employer]" system interpretation.

## 2022-05-14 NOTE — Progress Notes (Signed)
   05/14/22 1620  Spiritual Encounters  Type of Visit Initial  Care provided to: Pt and family  Referral source Chaplain team  Reason for visit Routine spiritual support  OnCall Visit Yes   Chaplain responded to a request for follow-up of the patient, Sarita,  She shared that she did not have much energy, she was tired. Her daughter, [name], shared that Danaijah was going back to her community today. I wished her well and both and enjoyable weekend.   Trish Sanmina-[employer] [phone]

## 2022-05-14 NOTE — Progress Notes (Deleted)
PROGRESS NOTE  [name] [id number] DOB: 1927-04-28 DOA: 05/12/2022 PCP: Eulas Post, MD   LOS: 1 day   Brief Narrative / Interim history: This is a 89 year old female with history of chronic back pain, GERD, HLD, history of PE on Eliquis, HTN, HLD, who comes into the hospital with coughing and weakness.  She is at SNF, did a chest x-ray due to coughing and it showed pneumonia.  She was hypoxic and eventually was brought to the hospital.  Daughter mentions that patient has declined significantly in the last 4 months since December 2023, has poor p.o. intake and has lost about 30 pounds.  She is now essentially bedbound and barely mobile even in bed, not even sitting up anymore.   Subjective / 24h Interval events: She is awake this morning, very hard of hearing.  Does not really answer any of my questions  Assesement and Plan: Principal Problem:   Acute respiratory failure with hypoxia secondary to aspiration pneumonia Active Problems:   Hypokalemia   Failure to thrive in adult   Sacral decubitus ulcer   Hypertension   History of pulmonary embolus (PE)   RLS (restless legs syndrome)   Aspiration pneumonia (HCC)   Palliative care encounter   End of life care   DNR (do not resuscitate)   Shortness of breath   Pain   High risk medication use   Need for emotional support   Goals of care, counseling/discussion   Influenza A   Principal problem Goals of care -patient was admitted to the hospital with failure to thrive, progressive decline over the last 4 months.  She has been aspirating more and now has respiratory failure, aspiration pneumonia and also tested positive for influenza.  Discussed with the daughter at bedside, and she is in favor of quality over quantity, and feels like her mother does not have good quality of life currently.  She has been clearly deteriorated and now essentially bedbound and minimally interactive.  She is not having much oral intake  and has been losing a significant amount of weight.  She also has a sacral decubitus ulcer in the setting of her immobility.  Palliative care also consulted.  Focusing now more on comfort approach.  Going to assess her needs over the next day or so, to determine whether she needs residential hospice or can return to SNF with hospice.  DNR in place.  Has been having very poor p.o. intake yesterday, only took a few sips of water.  If this persist, she may benefit from residential hospice  Acute hypoxic respiratory failure due to aspiration pneumonia Influenza pneumonia -comfort approach currently 1/4 staph epi positive blood cultures-likely contaminant Hypokalemia FTT Stage IV sacral decubitus ulcer, POA Severe protein calorie malnutrition Hypocalcemia Leukopenia    Scheduled Meds: Continuous Infusions: PRN Meds:.acetaminophen **OR** acetaminophen, antiseptic oral rinse, glycopyrrolate **OR** glycopyrrolate **OR** glycopyrrolate, haloperidol **OR** haloperidol **OR** haloperidol lactate, LORazepam **OR** LORazepam **OR** LORazepam, morphine injection, [DISCONTINUED] ondansetron **OR** ondansetron (ZOFRAN) IV, polyvinyl alcohol  Current Outpatient Medications  Medication Instructions   acetaminophen (TYLENOL) 500 mg, Oral, Daily at bedtime   acetaminophen (TYLENOL) 650 mg, Rectal, Every 4 hours PRN   albuterol (VENTOLIN HFA) 108 (90 Base) MCG/ACT inhaler 2 puffs, Inhalation, Every 4 hours PRN   Amino Acids-Protein Hydrolys (FEEDING SUPPLEMENT, PRO-STAT SUGAR FREE 64,) LIQD 30 mLs, Oral, 2 times daily   apixaban (ELIQUIS) 5 MG TABS tablet AT THE START OF THERAPY , TAKE 2 TABLETS TWICE A DAY FOR 7 DAYS  AND THEN 1 TABLET TWICE A DAY THEREAFTER   baclofen (LIORESAL) 10 mg, Oral, Daily at bedtime   carboxymethylcellulose (REFRESH PLUS) 0.5 % SOLN 1 drop, Both Eyes, 2 times daily   diclofenac Sodium (VOLTAREN) 2 g, Topical, 3 times daily   diltiazem (CARDIZEM CD) 120 mg, Oral, Daily   FeroSul 325  mg, Oral, Daily   furosemide (LASIX) 20 MG tablet Take 1 tablet once daily as needed for edema   ipratropium-albuterol (DUONEB) 0.5-2.5 (3) MG/3ML SOLN 3 mLs, Nebulization, Every 6 hours   Lactobacillus-Inulin ([place]) CAPS 1 capsule, Oral, 2 times daily before meals   levofloxacin (LEVAQUIN) 750 mg, Oral, Daily   losartan (COZAAR) 100 mg, Oral, Daily   melatonin 5 mg, Oral, Daily at bedtime   mirtazapine (REMERON) 15 mg, Oral, Daily at bedtime   Mucinex 600 mg, Oral, Every 12 hours PRN   Multiple Vitamin (MULITIVITAMIN WITH MINERALS) TABS 1 tablet, Oral, Daily   nystatin (MYCOSTATIN/NYSTOP) powder 1 Application, Topical, 2 times daily   nystatin cream (MYCOSTATIN) 1 Application, Topical, 2 times daily   pramipexole (MIRAPEX) 0.125 MG tablet TAKE 1 TO 2 TABLETS BY MOUTH AT NIGHT AS NEEDED FOR RESTLESS LEGS OR SYMPTOMS   predniSONE (DELTASONE) 40 mg, Oral, Daily   senna-docusate (SENOKOT-S) 8.6-50 MG tablet 1 tablet, Oral, At bedtime PRN   sodium chloride 1 g, Oral, 3 times daily   sodium hypochlorite (DAKIN'S 1/2 STRENGTH) external solution 1 Application, Irrigation, 2 times daily   Spacer/Aero-Holding Chambers (AEROCHAMBER MV) inhaler Use as instructed   traMADol (ULTRAM) 50 mg, Oral, Every 12 hours PRN   Triclosan [id number] % LIQD 1 Application, Apply externally, Daily    Diet Orders (From admission, onward)     Start     Ordered   05/13/22 1052  Diet regular Room service appropriate? Yes; Fluid consistency: Thin  Diet effective now       Question Answer Comment  Room service appropriate? Yes   Fluid consistency: Thin      05/13/22 1051            DVT prophylaxis:    Lab Results  Component Value Date   PLT 297 05/13/2022      Code Status: DNR  Family Communication: Daughter at bedside  Status is: Observation  The patient will require care spanning > 2 midnights and should be moved to inpatient because: Transition to comfort, may need residential  hospice  Level of care: Med-Surg  Consultants:  Palliative care  Objective: Vitals:   05/13/22 0759 05/13/22 1335 05/13/22 2045 05/14/22 0459  BP:  (!) 145/61 (!) 156/77 123/77  Pulse:  83 97 94  Resp:  (!) 24 20 18   Temp:  98.2 F (36.8 C) 97.8 F (36.6 C) 98 F (36.7 C)  TempSrc:  Oral Oral Oral  SpO2: 97% 97% 97% 100%  Weight:      Height:        Intake/Output Summary (Last 24 hours) at 05/14/2022 [id number] Last data filed at 05/14/2022 0600 Gross per 24 hour  Intake 440 ml  Output 400 ml  Net 40 ml    Wt Readings from Last 3 Encounters:  05/12/22 76 kg  04/30/21 74.8 kg  04/24/20 71.3 kg    Examination:  Constitutional: NAD ENMT: mmm Neck: normal, supple Respiratory: clear to auscultation bilaterally, no wheezing, no crackles. Normal respiratory effort.  Cardiovascular: Regular rate and rhythm, no murmurs / rubs / gallops. No LE edema. Abdomen: soft, no distention, no tenderness.  Bowel sounds positive.   Data Reviewed: I have independently reviewed following labs and imaging studies   CBC Recent Labs  Lab 05/12/22 1140 05/13/22 0527  WBC 3.9* 3.0*  HGB 12.1 11.3*  HCT 39.2 37.3  PLT 328 297  MCV 83.2 85.4  MCH 25.7* 25.9*  MCHC 30.9 30.3  RDW 17.6* 17.7*  LYMPHSABS 1.4  --   MONOABS 0.5  --   EOSABS 0.0  --   BASOSABS 0.0  --      Recent Labs  Lab 05/12/22 1140 05/12/22 1631 05/12/22 2050 05/13/22 0527  NA 136  --   --  135  K 3.2*  --   --  4.0  CL 104  --   --  106  CO2 26  --   --  20*  GLUCOSE 103*  --   --  80  BUN 17  --   --  15  CREATININE 0.44  --   --  0.37*  CALCIUM 8.3*  --   --  7.7*  MG  --  1.8  --   --   PROCALCITON  --  <0.10  --  <0.10  LATICACIDVEN 1.8 1.8  --   --   TSH  --   --  2.282  --   BNP 66.0  --   --   --      ------------------------------------------------------------------------------------------------------------------ No results for input(s): "CHOL", "HDL", "LDLCALC", "TRIG", "CHOLHDL",  "LDLDIRECT" in the last 72 hours.  No results found for: "HGBA1C" ------------------------------------------------------------------------------------------------------------------ Recent Labs    05/12/22 2050  TSH 2.282     Cardiac Enzymes No results for input(s): "CKMB", "TROPONINI", "MYOGLOBIN" in the last 168 hours.  Invalid input(s): "CK" ------------------------------------------------------------------------------------------------------------------    Component Value Date/Time   BNP 66.0 05/12/2022 1140    CBG: No results for input(s): "GLUCAP" in the last 168 hours.  Recent Results (from the past 240 hour(s))  Culture, blood (routine x 2)     Status: None (Preliminary result)   Collection Time: 05/12/22 11:40 AM   Specimen: BLOOD  Result Value Ref Range Status   Specimen Description   Final    BLOOD RIGHT ANTECUBITAL Performed at [facility] [address] [place], [place] [address]    Special Requests   Final    BOTTLES DRAWN AEROBIC AND ANAEROBIC Blood Culture adequate volume Performed at [facility] [address], [place] [employer], [place] [address]    Culture  Setup Time   Final    GRAM POSITIVE COCCI IN CLUSTERS AEROBIC BOTTLE ONLY CRITICAL RESULT CALLED TO, READ BACK BY AND VERIFIED WITH: PHARMD D. [id number] @ [address] [place]    Culture   Final    GRAM POSITIVE COCCI IN CLUSTERS TOO YOUNG TO READ Performed at [facility], [place] [address], [place], [place] [address]    Report Status PENDING  Incomplete  SARS Coronavirus 2 by RT PCR (hospital order, performed in [facility] hospital lab) *cepheid single result test*     Status: None   Collection Time: 05/12/22 11:40 AM   Specimen: Nasal Swab  Result Value Ref Range Status   SARS Coronavirus 2 by RT PCR NEGATIVE NEGATIVE Final    Comment: (NOTE) SARS-CoV-2 target nucleic acids are NOT DETECTED.  The SARS-CoV-2 RNA is generally detectable in upper and  lower respiratory specimens during the acute phase of infection. The lowest concentration of SARS-CoV-2 viral copies this assay can detect is 250 copies / mL.  A negative result does not preclude SARS-CoV-2 infection and should not be used as the sole basis for treatment or other patient management decisions.  A negative result may occur with improper specimen collection / handling, submission of specimen other than nasopharyngeal swab, presence of viral mutation(s) within the areas targeted by this assay, and inadequate number of viral copies (<250 copies / mL). A negative result must be combined with clinical observations, patient history, and epidemiological information.  Fact Sheet for Patients:   https://www.patel.info/  Fact Sheet for Healthcare Providers: https://hall.com/  This test is not yet approved or  cleared by the [place] FDA and has been authorized for detection and/or diagnosis of SARS-CoV-2 by FDA under an Emergency Use Authorization (EUA).  This EUA will remain in effect (meaning this test can be used) for the duration of the COVID-19 declaration under Section 564(b)(1) of the Act, 21 U.S.C. section 360bbb-3(b)(1), unless the authorization is terminated or revoked sooner.  Performed at [facility], [place] [address] [place], [place] [address]   Blood Culture ID Panel (Reflexed)     Status: Abnormal   Collection Time: 05/12/22 11:40 AM  Result Value Ref Range Status   Enterococcus faecalis NOT DETECTED NOT DETECTED Final   Enterococcus Faecium NOT DETECTED NOT DETECTED Final   Listeria monocytogenes NOT DETECTED NOT DETECTED Final   Staphylococcus species DETECTED (A) NOT DETECTED Final    Comment: CRITICAL RESULT CALLED TO, READ BACK BY AND VERIFIED WITH: PHARMD D. [id number] @ 2140 FH    Staphylococcus aureus (BCID) NOT DETECTED NOT DETECTED Final   Staphylococcus epidermidis DETECTED (A)  NOT DETECTED Final    Comment: Methicillin (oxacillin) resistant coagulase negative staphylococcus. Possible blood culture contaminant (unless isolated from more than one blood culture draw or clinical case suggests pathogenicity). No antibiotic treatment is indicated for blood  culture contaminants. CRITICAL RESULT CALLED TO, READ BACK BY AND VERIFIED WITH: PHARMD D. [id number] @ 2140 FH    Staphylococcus lugdunensis NOT DETECTED NOT DETECTED Final   Streptococcus species NOT DETECTED NOT DETECTED Final   Streptococcus agalactiae NOT DETECTED NOT DETECTED Final   Streptococcus pneumoniae NOT DETECTED NOT DETECTED Final   Streptococcus pyogenes NOT DETECTED NOT DETECTED Final   A.calcoaceticus-baumannii NOT DETECTED NOT DETECTED Final   Bacteroides fragilis NOT DETECTED NOT DETECTED Final   Enterobacterales NOT DETECTED NOT DETECTED Final   Enterobacter cloacae complex NOT DETECTED NOT DETECTED Final   Escherichia coli NOT DETECTED NOT DETECTED Final   Klebsiella aerogenes NOT DETECTED NOT DETECTED Final   Klebsiella oxytoca NOT DETECTED NOT DETECTED Final   Klebsiella pneumoniae NOT DETECTED NOT DETECTED Final   Proteus species NOT DETECTED NOT DETECTED Final   Salmonella species NOT DETECTED NOT DETECTED Final   Serratia marcescens NOT DETECTED NOT DETECTED Final   Haemophilus influenzae NOT DETECTED NOT DETECTED Final   Neisseria meningitidis NOT DETECTED NOT DETECTED Final   Pseudomonas aeruginosa NOT DETECTED NOT DETECTED Final   Stenotrophomonas maltophilia NOT DETECTED NOT DETECTED Final   Candida albicans NOT DETECTED NOT DETECTED Final   Candida auris NOT DETECTED NOT DETECTED Final   Candida glabrata NOT DETECTED NOT DETECTED Final   Candida krusei NOT DETECTED NOT DETECTED Final   Candida parapsilosis NOT DETECTED NOT DETECTED Final   Candida tropicalis NOT DETECTED NOT DETECTED Final   Cryptococcus neoformans/gattii NOT DETECTED NOT DETECTED Final   Methicillin  resistance mecA/C DETECTED (A) NOT DETECTED Final    Comment: CRITICAL RESULT CALLED TO, READ  BACK BY AND VERIFIED WITH: Riki Rusk [id number] @ 2140 FH Performed at [facility], [place] [address], [place], [place] [address]   Culture, blood (routine x 2)     Status: None (Preliminary result)   Collection Time: 05/12/22 11:47 AM   Specimen: BLOOD  Result Value Ref Range Status   Specimen Description   Final    BLOOD SITE NOT SPECIFIED Performed at [facility] [address], [place], [place] [address]    Special Requests   Final    BOTTLES DRAWN AEROBIC AND ANAEROBIC Blood Culture results may not be optimal due to an excessive volume of blood received in culture bottles Performed at [facility] [address] [place], [place] [address]    Culture   Final    NO GROWTH 2 DAYS Performed at [facility] [address], [place], [place] [address]    Report Status PENDING  Incomplete  Respiratory (~20 pathogens) panel by PCR     Status: Abnormal   Collection Time: 05/12/22 10:15 PM   Specimen: Nasopharyngeal Swab; Respiratory  Result Value Ref Range Status   Adenovirus NOT DETECTED NOT DETECTED Final   Coronavirus 229E NOT DETECTED NOT DETECTED Final    Comment: (NOTE) The Coronavirus on the Respiratory Panel, DOES NOT test for the novel  Coronavirus (2019 nCoV)    Coronavirus HKU1 NOT DETECTED NOT DETECTED Final   Coronavirus NL63 NOT DETECTED NOT DETECTED Final   Coronavirus OC43 NOT DETECTED NOT DETECTED Final   Metapneumovirus NOT DETECTED NOT DETECTED Final   Rhinovirus / Enterovirus NOT DETECTED NOT DETECTED Final   Influenza A H3 DETECTED (A) NOT DETECTED Final   Influenza B NOT DETECTED NOT DETECTED Final   Parainfluenza Virus 1 NOT DETECTED NOT DETECTED Final   Parainfluenza Virus 2 NOT DETECTED NOT DETECTED Final   Parainfluenza Virus 3 NOT DETECTED NOT DETECTED Final   Parainfluenza Virus 4 NOT DETECTED NOT  DETECTED Final   Respiratory Syncytial Virus NOT DETECTED NOT DETECTED Final   Bordetella pertussis NOT DETECTED NOT DETECTED Final   Bordetella Parapertussis NOT DETECTED NOT DETECTED Final   Chlamydophila pneumoniae NOT DETECTED NOT DETECTED Final   Mycoplasma pneumoniae NOT DETECTED NOT DETECTED Final    Comment: Performed at [facility], [place]. [address] [place], [place] [address]     Radiology Studies: No results found.   Marzetta Board, MD, PhD [facility]  Between 7 am - 7 pm I am available, please contact me via Amion (for emergencies) or Securechat (non urgent messages)  Between 7 pm - 7 am I am not available, please contact night coverage MD/APP via Amion

## 2022-05-14 NOTE — Plan of Care (Signed)
?  Problem: Clinical Measurements: ?Goal: Respiratory complications will improve ?Outcome: Not Progressing ?  ?Problem: Activity: ?Goal: Risk for activity intolerance will decrease ?Outcome: Not Progressing ?  ?Problem: Nutrition: ?Goal: Adequate nutrition will be maintained ?Outcome: Not Progressing ?  ?

## 2022-05-14 NOTE — Progress Notes (Signed)
Report called to [facility] at [facility].  Pt prepared for discharge and awaiting PTAR for transport. Andre Lefort

## 2022-05-14 NOTE — TOC Transition Note (Signed)
Transition of Care [facility]) - CM/SW Discharge Note   Patient Details  Name: [name] MRN: [id number] Date of Birth: November 06, 1927  [facility] CM/SW Contact:  Henrietta Dine, RN Phone Number: 05/14/2022, 4:06 PM   Clinical Narrative:    D/C orders received for pt; d/c summary and SNF transfer report sent via hub; contacted Elyse Hsu at [facility]; given RM# [phone], call report # [phone]; pt's dtr Luella Cook notified and agrees to d/c plan; pt will by PTAR; PTAR at 1601.spoke w/ [place]; no TOC needs.   Final next level of care: Skilled Nursing Facility Barriers to Discharge: No Barriers Identified   Patient Goals and CMS Choice CMS Medicare.gov Compare Post Acute Care list provided to:: Other (Comment Required) (pt's dtr)    Discharge Placement                Patient [facility] bed at: [facility] Patient to be transferred to facility by: [place] Name of family member notified: Luella Cook Patient and family notified of of transfer: 05/14/22  Discharge Plan and Services Additional resources added to the After Visit Summary for     Discharge Planning Services: CM Consult                                 Social Determinants of Health (SDOH) Interventions SDOH Screenings   Food Insecurity: No Food Insecurity (05/14/2022)  Housing: [place]  (05/14/2022)  Transportation Needs: No Transportation Needs (05/14/2022)  Utilities: Not At Risk (05/14/2022)  Alcohol Screen: Low Risk  (04/30/2021)  Depression (PHQ2-9): Low Risk  (04/30/2021)  Financial Resource Strain: Low Risk  (04/30/2021)  Physical Activity: Insufficiently Active (04/30/2021)  Social Connections: Moderately Integrated (04/30/2021)  Stress: No Stress Concern Present (04/30/2021)  Tobacco Use: Low Risk  (05/13/2022)     Readmission Risk Interventions    05/14/2022   11:32 AM  Readmission Risk Prevention Plan  Transportation Screening Complete  PCP or  Specialist Appt within 3-5 Days Complete  HRI or [place] Complete  Social Work Consult for [place] Planning/Counseling Complete  Palliative Care Screening Complete  Medication Review Press photographer) Complete

## 2022-05-14 NOTE — NC FL2 (Signed)
[place] LEVEL OF CARE FORM     IDENTIFICATION  Patient Name: [name] Birthdate: 09/25/1927 Sex: female Admission Date (Current Location): 05/12/2022  [facility] and [place] Number:  Herbalist and Address:  [facility],  [place], [place]      Provider Number: [id number]  Attending Physician Name and Address:  Caren Griffins, MD  Relative Name and Phone Number:  Luella Cook (dtr) [phone]    Current Level of Care: Hospital Recommended Level of Care: [place] Prior Approval Number:    Date Approved/Denied:   PASRR Number: [id number]  Discharge Plan: SNF    Current Diagnoses: Patient Active Problem List   Diagnosis Date Noted   Counseling and coordination of care 05/14/2022   Palliative care encounter 05/13/2022   End of life care 05/13/2022   DNR (do not resuscitate) 05/13/2022   Shortness of breath 05/13/2022   Pain 05/13/2022   High risk medication use 05/13/2022   Need for emotional support 05/13/2022   Goals of care, counseling/discussion 05/13/2022   Influenza A 05/13/2022   Hypokalemia 05/12/2022   Acute respiratory failure with hypoxia secondary to aspiration pneumonia 05/12/2022   Aspiration pneumonia ([place]) 05/12/2022   Failure to thrive in adult 05/12/2022   Sacral decubitus ulcer 05/12/2022   CAP (community acquired pneumonia) 01/23/2022   Community acquired pneumonia of left lower lobe of lung 01/22/2022   History of pulmonary embolus (PE) 01/22/2022   Physical debility 01/22/2022   Hyponatremia 01/22/2022   RLS (restless legs syndrome) 08/04/2020   Hypertension 12/12/2018   Left leg weakness 04/19/2013   CARDIOMEGALY 03/16/2010   COLONIC POLYPS 02/20/2010   GOITER 02/20/2010   Osteoarthritis of multiple joints 02/20/2010   BACK PAIN, LUMBAR, CHRONIC 02/20/2010   Urinary incontinence 02/20/2010   DIVERTICULITIS, HX OF 02/20/2010    Orientation  RESPIRATION BLADDER Height & Weight     Self  O2 Incontinent Weight: 76 kg Height:  5\' 1"  (154.9 cm)  BEHAVIORAL SYMPTOMS/MOOD NEUROLOGICAL BOWEL NUTRITION STATUS      Incontinent Diet (regular)  AMBULATORY STATUS COMMUNICATION OF NEEDS Skin   Total Care Verbally PU Stage and Appropriate Care, Skin abrasions, Bruising (ecchymosis and erythema to bil legs, under breasts, buttocks and arms; stage 4 pressure ulcer to coccycx w/ exposed bone, tendon, or muscle, site weepng)       PU Stage 4 Dressing:  (foam dressing change every 3 days and prn)               Personal Care Assistance Level of Assistance  Total care       Total Care Assistance: Maximum assistance   Functional Limitations Info  Sight, Hearing, Speech Sight Info: Impaired Hearing Info: Impaired Speech Info: Adequate    SPECIAL CARE FACTORS FREQUENCY   (comfort care)                    Contractures Contractures Info: Not present    Additional Factors Info  Code Status, Allergies, Isolation Precautions (Droplet precautions) Code Status Info: DNR Allergies Info: NKDA     Isolation Precautions Info: droplet precautions     Current Medications (05/14/2022):  This is the current hospital active medication list Current Facility-Administered Medications  Medication Dose Route Frequency Provider Last Rate Last Admin   acetaminophen (TYLENOL) tablet 650 mg  650 mg Oral Q6H PRN Orma Flaming, MD       Or   acetaminophen (TYLENOL) suppository 650 mg  650 mg Rectal Q6H PRN Orma Flaming, MD   650 mg at 05/13/22 [id number]   antiseptic oral rinse (BIOTENE) solution 15 mL  15 mL Topical PRN Mims, Lauren W, DO       glycopyrrolate (ROBINUL) tablet 1 mg  1 mg Oral Q4H PRN Mims, Lauren W, DO       Or   glycopyrrolate (ROBINUL) injection 0.2 mg  0.2 mg Subcutaneous Q4H PRN Mims, Lauren W, DO       Or   glycopyrrolate (ROBINUL) injection 0.2 mg  0.2 mg Intravenous Q4H PRN Terrilee Files, DO   0.2 mg at 05/14/22 0148    haloperidol (HALDOL) tablet 0.5 mg  0.5 mg Oral Q4H PRN Terrilee Files, DO       Or   haloperidol (HALDOL) 2 MG/ML solution 0.5 mg  0.5 mg Sublingual Q4H PRN Mims, Lauren W, DO       Or   haloperidol lactate (HALDOL) injection 0.5 mg  0.5 mg Intravenous Q4H PRN Mims, Lauren W, DO       LORazepam (ATIVAN) tablet 1 mg  1 mg Oral Q4H PRN Terrilee Files, DO   1 mg at 05/13/22 1859   Or   LORazepam (ATIVAN) 2 MG/ML concentrated solution 1 mg  1 mg Sublingual Q4H PRN Mims, Lauren W, DO       Or   LORazepam (ATIVAN) 2 MG/ML concentrated solution 1 mg  1 mg Oral Q4H PRN Mims, Lauren W, DO       morphine (PF) 2 MG/ML injection 2 mg  2 mg Intravenous Q1H PRN Mims, Lauren W, DO   2 mg at 05/14/22 0534   ondansetron (ZOFRAN) injection 4 mg  4 mg Intravenous Q6H PRN Orma Flaming, MD       polyvinyl alcohol (LIQUIFILM TEARS) 1.4 % ophthalmic solution 1 drop  1 drop Both Eyes QID PRN Terrilee Files, DO         Discharge Medications: Please see discharge summary for a list of discharge medications.  Relevant Imaging Results:  Relevant Lab Results:   Additional Information SSN [id number]  Henrietta Dine, RN

## 2022-05-14 NOTE — Discharge Summary (Signed)
Physician Discharge Summary  ADUT AEGERTER [id number] DOB: 04-17-1927 DOA: 05/12/2022  PCP: Eulas Post, MD  Admit date: 05/12/2022 Discharge date: 05/14/2022  Admitted From: ALF Disposition:  SNF  Recommendations for Outpatient Follow-up:  Please continue hospice care  Home Health: none Equipment/Devices: none  Discharge Condition: stable CODE STATUS: DNR Diet Orders (From admission, onward)     Start     Ordered   05/13/22 1052  Diet regular Room service appropriate? Yes; Fluid consistency: Thin  Diet effective now       Question Answer Comment  Room service appropriate? Yes   Fluid consistency: Thin      05/13/22 1051            HPI: Per admitting MD, [name] is a 89 y.o. female with medical history significant of chronic back pain, GERD, HLD, hx of PE on eliquis, HTN, HLD, RLS who presented to ED with complaints of coughing.  She is at a SNF and they did a CXR on her due to her coughing all day Sunday.  It showed pneumonia and she was started on levaquin on Monday or Tuesday.  She was also put on 2L oxygen on Sunday at the SNF and daughter reports because it dropped to 89%.  She continued to get worse so they brought her to the ED. Her daughter is at bedside and gives history. She also think she may have had fever yesterday morning. She has complained of just feeling awful. She has poor PO intake. She has probably lost 30 pounds since the first of December.  Denies vision changes/headaches, chest pain or palpitations, abdominal pain, N/V/D, dysuria or leg swelling. Chronic sacral ulcer that has been hurting her.   Hospital Course / Discharge diagnoses: Principal Problem:   Acute respiratory failure with hypoxia secondary to aspiration pneumonia Active Problems:   Hypokalemia   Failure to thrive in adult   Sacral decubitus ulcer   Hypertension   History of pulmonary embolus (PE)   RLS (restless legs syndrome)   Aspiration pneumonia  (HCC)   Palliative care encounter   End of life care   DNR (do not resuscitate)   Shortness of breath   Pain   High risk medication use   Need for emotional support   Goals of care, counseling/discussion   Influenza A   Counseling and coordination of care   Principal problem Goals of care -patient was admitted to the hospital with failure to thrive, progressive decline over the last 4 months.  She has been aspirating more and now has respiratory failure, aspiration pneumonia and also tested positive for influenza.  Discussed with the daughter at bedside, and she is in favor of quality over quantity, and feels like her mother does not have good quality of life currently.  She has been clearly deteriorated and now essentially bedbound and minimally interactive.  She is not having much oral intake and has been losing a significant amount of weight.  She also has a sacral decubitus ulcer in the setting of her immobility.  Palliative care also consulted.  Focusing now more on comfort approach.  I have discontinued all nonessential meds upon discharge since main goals are now for comfort.  Allow comfort feeding, and hospice follow-up.   Acute hypoxic respiratory failure due to aspiration pneumonia Influenza pneumonia -comfort approach currently 1/4 staph epi positive blood cultures-likely contaminant Hypokalemia FTT Stage IV sacral decubitus ulcer, POA Severe protein calorie malnutrition Hypocalcemia Leukopenia  Sepsis ruled out  Discharge Instructions   Allergies as of 05/14/2022   No Known Allergies      Medication List     STOP taking these medications    apixaban 5 MG Tabs tablet Commonly known as: Eliquis   Culturelle Digestive Health Caps   diltiazem 120 MG 24 hr capsule Commonly known as: Cardizem CD   feeding supplement (PRO-STAT SUGAR FREE 64) Liqd   FeroSul 325 (65 FE) MG tablet Generic drug: ferrous sulfate   furosemide 20 MG tablet Commonly known as:  LASIX   levofloxacin 750 MG tablet Commonly known as: LEVAQUIN   losartan 100 MG tablet Commonly known as: COZAAR   melatonin 5 MG Tabs   multivitamin with minerals Tabs tablet   predniSONE 20 MG tablet Commonly known as: DELTASONE   sodium chloride 1 g tablet   sodium hypochlorite external solution Commonly known as: DAKIN'S 1/2 STRENGTH   Triclosan 0.15 % Liqd       TAKE these medications    acetaminophen 500 MG tablet Commonly known as: TYLENOL Take 500 mg by mouth at bedtime.   acetaminophen 650 MG suppository Commonly known as: TYLENOL Place 650 mg rectally every 4 (four) hours as needed for fever.   AeroChamber MV inhaler Use as instructed   albuterol 108 (90 Base) MCG/ACT inhaler Commonly known as: VENTOLIN HFA Inhale 2 puffs into the lungs every 4 (four) hours as needed for wheezing or shortness of breath.   baclofen 10 MG tablet Commonly known as: LIORESAL Take 1 tablet (10 mg total) by mouth at bedtime.   carboxymethylcellulose 0.5 % Soln Commonly known as: REFRESH PLUS Place 1 drop into both eyes 2 (two) times daily.   ipratropium-albuterol 0.5-2.5 (3) MG/3ML Soln Commonly known as: DUONEB Take 3 mLs by nebulization every 6 (six) hours.   LORazepam 2 MG/ML concentrated solution Commonly known as: ATIVAN Place 0.5 mLs (1 mg total) under the tongue every 4 (four) hours as needed for anxiety.   mirtazapine 15 MG tablet Commonly known as: REMERON Take 15 mg by mouth at bedtime.   Mucinex 600 MG 12 hr tablet Generic drug: guaiFENesin Take 600 mg by mouth every 12 (twelve) hours as needed for cough.   nystatin cream Commonly known as: MYCOSTATIN Apply 1 Application topically 2 (two) times daily.   nystatin powder Commonly known as: MYCOSTATIN/NYSTOP Apply 1 Application topically 2 (two) times daily.   pramipexole 0.125 MG tablet Commonly known as: MIRAPEX TAKE 1 TO 2 TABLETS BY MOUTH AT NIGHT AS NEEDED FOR RESTLESS LEGS OR  SYMPTOMS What changed: See the new instructions.   senna-docusate 8.6-50 MG tablet Commonly known as: Senokot-S Take 1 tablet by mouth at bedtime as needed for mild constipation.   traMADol 50 MG tablet Commonly known as: ULTRAM Take 1 tablet (50 mg total) by mouth every 12 (twelve) hours as needed for moderate pain.   Voltaren 1 % Gel Generic drug: diclofenac Sodium Apply 2 g topically 3 (three) times daily.         Consultations: Palliative  Procedures/Studies:  DG Sacrum/Coccyx  Result Date: 05/12/2022 CLINICAL DATA:  Open wound over the sacrum and coccyx EXAM: SACRUM AND COCCYX - 2+ VIEW COMPARISON:  None Available. FINDINGS: Diffuse bone demineralization. Postoperative changes with posterior fixation from L3 through S2. Fixation hardware appears intact. No significant bone lucency at the bone hardware interfaces. The sacral end of the fixation rod extends dorsally with the tip at or just below the skin surface. This may result in skin erosion.  Correlate with physical examination. No evidence of acute fracture or dislocation. SI joints and symphysis pubis are not displaced. No bone erosion or sclerosis to suggest osteomyelitis. Degenerative changes in the lower lumbar spine and hips. Residual contrast material in the renal collecting systems and in the bladder. Bladder trabeculation. IMPRESSION: 1. No acute bony abnormalities. No radiographic evidence of osteomyelitis. 2. Postoperative changes with fixation of the lower lumbar and sacral spine. Distal fixation hardware extends at or just below the skin surface possibly resulting in skin erosion. Correlate with physical examination. 3. Degenerative changes. Electronically Signed   By: Lucienne Capers M.D.   On: 05/12/2022 19:17   CT Angio Chest PE W and/or Wo Contrast  Result Date: 05/12/2022 CLINICAL DATA:  Pulmonary embolus suspected EXAM: CT ANGIOGRAPHY CHEST WITH CONTRAST TECHNIQUE: Multidetector CT imaging of the chest was  performed using the standard protocol during bolus administration of intravenous contrast. Multiplanar CT image reconstructions and MIPs were obtained to evaluate the vascular anatomy. RADIATION DOSE REDUCTION: This exam was performed according to the departmental dose-optimization program which includes automated exposure control, adjustment of the mA and/or kV according to patient size and/or use of iterative reconstruction technique. CONTRAST:  76mL OMNIPAQUE IOHEXOL 350 MG/ML SOLN COMPARISON:  Chest CT dated January 23, 2022; CT angio dated November 15, 2018 FINDINGS: Cardiovascular: Mild cardiomegaly. No pericardial effusion. Normal caliber thoracic aorta with moderate atherosclerotic disease. No evidence of pulmonary embolus, although evaluation of the segmental and subsegmental pulmonary arteries is limited due to streak and respiratory motion artifact. Mediastinum/Nodes: Large hiatal hernia with intrathoracic stomach. No enlarged lymph nodes seen in the chest. Lungs/Pleura: Small amount of debris seen in the right mainstem bronchus and right lower lobe. Expiratory phase imaging with excessive inward bulging posterior tracheal membrane. Elevation of the right hemidiaphragm. Lower lung predominant reticular opacities with associated traction bronchiectasis. Bibasilar atelectasis. No consolidation, pleural effusion or pneumothorax. Upper Abdomen: Gallstones.  No acute abnormality. Musculoskeletal: Bilateral total shoulder arthroplasties. No aggressive appearing osseous lesions. Review of the MIP images confirms the above findings. IMPRESSION: 1. No evidence of pulmonary embolus, although evaluation of the segmental and subsegmental pulmonary arteries is limited due to streak and respiratory motion artifact. 2. Large hiatal hernia with intrathoracic stomach. 3. Unchanged mild lower lung predominant reticular opacities with associated traction bronchiectasis, findings can be seen in the setting of interstitial  lung disease. 4. Small amount of debris seen in the right mainstem bronchus and right lower lobe, consistent with aspiration. 5. Expiratory phase imaging with excessive dynamic airway collapse. 6. Aortic Atherosclerosis (ICD10-I70.0). Electronically Signed   By: Yetta Glassman M.D.   On: 05/12/2022 14:24   DG Chest Port 1 View  Result Date: 05/12/2022 CLINICAL DATA:  sob EXAM: PORTABLE CHEST - 1 VIEW COMPARISON:  01/22/2022 FINDINGS: Low lung volumes with crowding of perihilar bronchovascular structures. No new infiltrate or overt edema. Heart size and mediastinal contours are within normal limits. Aortic Atherosclerosis (ICD10-170.0). Blunting of costophrenic angles suggesting small effusions. Bilateral shoulder arthroplasty hardware. IMPRESSION: Low volumes with possible small effusions. Electronically Signed   By: Lucrezia Europe M.D.   On: 05/12/2022 11:39     Subjective: - no chest pain, shortness of breath, no abdominal pain, nausea or vomiting.   Discharge Exam: BP 127/65 (BP Location: Right Arm)   Pulse 97   Temp 99.1 F (37.3 C) (Oral)   Resp 14   Ht 5\' 1"  (1.549 m)   Wt 76 kg   SpO2 92%   BMI 31.66  kg/m   General: Pt is alert, awake, not in acute distress Cardiovascular: RRR, S1/S2 +, no rubs, no gallops Respiratory: CTA bilaterally, no wheezing, no rhonchi Abdominal: Soft, NT, ND, bowel sounds + Extremities: no edema, no cyanosis    The results of significant diagnostics from this hospitalization (including imaging, microbiology, ancillary and laboratory) are listed below for reference.     Microbiology: Recent Results (from the past 240 hour(s))  Culture, blood (routine x 2)     Status: None (Preliminary result)   Collection Time: 05/12/22 11:40 AM   Specimen: BLOOD  Result Value Ref Range Status   Specimen Description   Final    BLOOD RIGHT ANTECUBITAL Performed at [facility] [address] [place], [place] [address]    Special Requests    Final    BOTTLES DRAWN AEROBIC AND ANAEROBIC Blood Culture adequate volume Performed at [facility] [address] [place], [place] [address]    Culture  Setup Time   Final    GRAM POSITIVE COCCI IN CLUSTERS AEROBIC BOTTLE ONLY CRITICAL RESULT CALLED TO, READ BACK BY AND VERIFIED WITH: PHARMD D. [id number] @ [address]    Culture   Final    GRAM POSITIVE COCCI IN CLUSTERS TOO YOUNG TO READ Performed at [facility], [place] [address] [place], [place] [address]    Report Status PENDING  Incomplete  SARS Coronavirus 2 by RT PCR (hospital order, performed in [facility] hospital lab) *cepheid single result test*     Status: None   Collection Time: 05/12/22 11:40 AM   Specimen: Nasal Swab  Result Value Ref Range Status   SARS Coronavirus 2 by RT PCR NEGATIVE NEGATIVE Final    Comment: (NOTE) SARS-CoV-2 target nucleic acids are NOT DETECTED.  The SARS-CoV-2 RNA is generally detectable in upper and lower respiratory specimens during the acute phase of infection. The lowest concentration of SARS-CoV-2 viral copies this assay can detect is 250 copies / mL. A negative result does not preclude SARS-CoV-2 infection and should not be used as the sole basis for treatment or other patient management decisions.  A negative result may occur with improper specimen collection / handling, submission of specimen other than nasopharyngeal swab, presence of viral mutation(s) within the areas targeted by this assay, and inadequate number of viral copies (<250 copies / mL). A negative result must be combined with clinical observations, patient history, and epidemiological information.  Fact Sheet for Patients:   https://www.patel.info/  Fact Sheet for Healthcare Providers: https://hall.com/  This test is not yet approved or  cleared by the [place] FDA and has been authorized for detection and/or diagnosis of SARS-CoV-2  by FDA under an Emergency Use Authorization (EUA).  This EUA will remain in effect (meaning this test can be used) for the duration of the COVID-19 declaration under Section 564(b)(1) of the Act, 21 U.S.C. section 360bbb-3(b)(1), unless the authorization is terminated or revoked sooner.  Performed at [facility], [place] [address], [place], [place] [address]   Blood Culture ID Panel (Reflexed)     Status: Abnormal   Collection Time: 05/12/22 11:40 AM  Result Value Ref Range Status   Enterococcus faecalis NOT DETECTED NOT DETECTED Final   Enterococcus Faecium NOT DETECTED NOT DETECTED Final   Listeria monocytogenes NOT DETECTED NOT DETECTED Final   Staphylococcus species DETECTED (A) NOT DETECTED Final    Comment: CRITICAL RESULT CALLED TO, READ BACK BY AND VERIFIED WITH: PHARMD D.  [place] [id number] @ 2140 [address]    Staphylococcus aureus (BCID) NOT DETECTED NOT DETECTED Final   Staphylococcus epidermidis DETECTED (A) NOT DETECTED Final    Comment: Methicillin (oxacillin) resistant coagulase negative staphylococcus. Possible blood culture contaminant (unless isolated from more than one blood culture draw or clinical case suggests pathogenicity). No antibiotic treatment is indicated for blood  culture contaminants. CRITICAL RESULT CALLED TO, READ BACK BY AND VERIFIED WITH: PHARMD D. [id number] @ 2140 FH    Staphylococcus lugdunensis NOT DETECTED NOT DETECTED Final   Streptococcus species NOT DETECTED NOT DETECTED Final   Streptococcus agalactiae NOT DETECTED NOT DETECTED Final   Streptococcus pneumoniae NOT DETECTED NOT DETECTED Final   Streptococcus pyogenes NOT DETECTED NOT DETECTED Final   A.calcoaceticus-baumannii NOT DETECTED NOT DETECTED Final   Bacteroides fragilis NOT DETECTED NOT DETECTED Final   Enterobacterales NOT DETECTED NOT DETECTED Final   Enterobacter cloacae complex NOT DETECTED NOT DETECTED Final   Escherichia coli NOT DETECTED NOT DETECTED Final    Klebsiella aerogenes NOT DETECTED NOT DETECTED Final   Klebsiella oxytoca NOT DETECTED NOT DETECTED Final   Klebsiella pneumoniae NOT DETECTED NOT DETECTED Final   Proteus species NOT DETECTED NOT DETECTED Final   Salmonella species NOT DETECTED NOT DETECTED Final   Serratia marcescens NOT DETECTED NOT DETECTED Final   Haemophilus influenzae NOT DETECTED NOT DETECTED Final   Neisseria meningitidis NOT DETECTED NOT DETECTED Final   Pseudomonas aeruginosa NOT DETECTED NOT DETECTED Final   Stenotrophomonas maltophilia NOT DETECTED NOT DETECTED Final   Candida albicans NOT DETECTED NOT DETECTED Final   Candida auris NOT DETECTED NOT DETECTED Final   Candida glabrata NOT DETECTED NOT DETECTED Final   Candida krusei NOT DETECTED NOT DETECTED Final   Candida parapsilosis NOT DETECTED NOT DETECTED Final   Candida tropicalis NOT DETECTED NOT DETECTED Final   Cryptococcus neoformans/gattii NOT DETECTED NOT DETECTED Final   Methicillin resistance mecA/C DETECTED (A) NOT DETECTED Final    Comment: CRITICAL RESULT CALLED TO, READ BACK BY AND VERIFIED WITH: Missy Sabins [id number] @ [address] [place] Performed at [facility], [address] [place], [place] [address]   Culture, blood (routine x 2)     Status: None (Preliminary result)   Collection Time: 05/12/22 11:47 AM   Specimen: BLOOD  Result Value Ref Range Status   Specimen Description   Final    BLOOD SITE NOT SPECIFIED Performed at [facility] [address] [place], [place] [address]    Special Requests   Final    BOTTLES DRAWN AEROBIC AND ANAEROBIC Blood Culture results may not be optimal due to an excessive volume of blood received in culture bottles Performed at [facility] [address], [place], [place] [address]    Culture   Final    NO GROWTH 2 DAYS Performed at [facility] [address], [place],  [address]    Report Status PENDING  Incomplete  Respiratory (~20  pathogens) panel by PCR     Status: Abnormal   Collection Time: 05/12/22 10:15 PM   Specimen: Nasopharyngeal Swab; Respiratory  Result Value Ref Range Status   Adenovirus NOT DETECTED NOT DETECTED Final   Coronavirus 229E NOT DETECTED NOT DETECTED Final    Comment: (NOTE) The Coronavirus on the Respiratory Panel, DOES NOT test for the novel  Coronavirus (2019 nCoV)    Coronavirus HKU1 NOT DETECTED NOT DETECTED Final   Coronavirus NL63 NOT DETECTED NOT DETECTED Final  Coronavirus OC43 NOT DETECTED NOT DETECTED Final   Metapneumovirus NOT DETECTED NOT DETECTED Final   Rhinovirus / Enterovirus NOT DETECTED NOT DETECTED Final   Influenza A H3 DETECTED (A) NOT DETECTED Final   Influenza B NOT DETECTED NOT DETECTED Final   Parainfluenza Virus 1 NOT DETECTED NOT DETECTED Final   Parainfluenza Virus 2 NOT DETECTED NOT DETECTED Final   Parainfluenza Virus 3 NOT DETECTED NOT DETECTED Final   Parainfluenza Virus 4 NOT DETECTED NOT DETECTED Final   Respiratory Syncytial Virus NOT DETECTED NOT DETECTED Final   Bordetella pertussis NOT DETECTED NOT DETECTED Final   Bordetella Parapertussis NOT DETECTED NOT DETECTED Final   Chlamydophila pneumoniae NOT DETECTED NOT DETECTED Final   Mycoplasma pneumoniae NOT DETECTED NOT DETECTED Final    Comment: Performed at [facility], [place] [address], [place], [place] [address]     Labs: Basic Metabolic Panel: Recent Labs  Lab 05/12/22 1140 05/12/22 1631 05/13/22 0527  NA 136  --  135  K 3.2*  --  4.0  CL 104  --  106  CO2 26  --  20*  GLUCOSE 103*  --  80  BUN 17  --  15  CREATININE 0.44  --  0.37*  CALCIUM 8.3*  --  7.7*  MG  --  1.8  --    Liver Function Tests: No results for input(s): "AST", "ALT", "ALKPHOS", "BILITOT", "PROT", "ALBUMIN" in the last 168 hours. CBC: Recent Labs  Lab 05/12/22 1140 05/13/22 0527  WBC 3.9* 3.0*  NEUTROABS 1.9  --   HGB 12.1 11.3*  HCT 39.2 37.3  MCV 83.2 85.4  PLT 328 297   CBG: No results for  input(s): "GLUCAP" in the last 168 hours. Hgb A1c No results for input(s): "HGBA1C" in the last 72 hours. Lipid Profile No results for input(s): "CHOL", "HDL", "LDLCALC", "TRIG", "CHOLHDL", "LDLDIRECT" in the last 72 hours. Thyroid function studies Recent Labs    05/12/22 2050  TSH 2.282   Urinalysis    Component Value Date/Time   COLORURINE YELLOW 01/23/2022 1100   APPEARANCEUR CLEAR 01/23/2022 1100   LABSPEC 1.015 01/23/2022 1100   PHURINE 5.0 01/23/2022 1100   GLUCOSEU NEGATIVE 01/23/2022 1100   HGBUR NEGATIVE 01/23/2022 1100   BILIRUBINUR NEGATIVE 01/23/2022 1100   KETONESUR NEGATIVE 01/23/2022 1100   PROTEINUR NEGATIVE 01/23/2022 1100   NITRITE NEGATIVE 01/23/2022 1100   LEUKOCYTESUR SMALL (A) 01/23/2022 1100    FURTHER DISCHARGE INSTRUCTIONS:   Get Medicines reviewed and adjusted: Please take all your medications with you for your next visit with your Primary MD   Laboratory/radiological data: Please request your Primary MD to go over all hospital tests and procedure/radiological results at the follow up, please ask your Primary MD to get all Hospital records sent to his/her office.   In some cases, they will be blood work, cultures and biopsy results pending at the time of your discharge. Please request that your primary care M.D. goes through all the records of your hospital data and follows up on these results.   Also Note the following: If you experience worsening of your admission symptoms, develop shortness of breath, life threatening emergency, suicidal or homicidal thoughts you must seek medical attention immediately by calling 911 or calling your MD immediately  if symptoms less severe.   You must read complete instructions/literature along with all the possible adverse reactions/side effects for all the Medicines you take and that have been prescribed to you. Take any new Medicines  after you have completely understood and accpet all the possible adverse  reactions/side effects.    Do not drive when taking Pain medications or sleeping medications (Benzodaizepines)   Do not take more than prescribed Pain, Sleep and Anxiety Medications. It is not advisable to combine anxiety,sleep and pain medications without talking with your primary care practitioner   Special Instructions: If you have smoked or chewed Tobacco  in the last 2 yrs please stop smoking, stop any regular Alcohol  and or any Recreational drug use.   Wear Seat belts while driving.   Please note: You were cared for by a hospitalist during your hospital stay. Once you are discharged, your primary care physician will handle any further medical issues. Please note that NO REFILLS for any discharge medications will be authorized once you are discharged, as it is imperative that you return to your primary care physician (or establish a relationship with a primary care physician if you do not have one) for your post hospital discharge needs so that they can reassess your need for medications and monitor your lab values.  Time coordinating discharge: 35 minutes  SIGNED:  Marzetta Board, MD, PhD 05/14/2022, 3:41 PM

## 2022-05-14 NOTE — TOC Progression Note (Addendum)
Transition of Care [facility]) - Progression Note    Patient Details  Name: [name] MRN: [id number] Date of Birth: 12/03/1927  Transition of Care [facility]) CM/SW Contact  Henrietta Dine, RN Phone Number: 05/14/2022, 10:59 AM  Clinical Narrative:    [facility] consulted for residential hospice; see PC note; spoke w/ pt's dtr [name] Doggett, and offered choices; pt's dtr says her 1st choice is [facility]; she says 2nd choice is for pt to return to [facility] at a higher level of care w/ hospice; she says pt still has bed on AL at facility; spoke w/ Elyse Hsu at facility; she confirms pt's level of care is assisted living; she says pt can return at higher level of care; the facility uses [facility] for hospice services; she also says FL2 will be needed if level of care changes; contacted Jhonnie Garner, Hospital Liaison for [facility]; she says pt will be seen this afternoon.  -[id number]- notified by [facility] Liaison from [facility], The pt is not candidate for [facility]; clarified w/ Elyse Hsu pt can return and she will speak w/ pt's dtr regarding billing; she says FL2 needed but no PT eval; Elyse Hsu also requests she be notified when pt ready to return.         Expected Discharge Plan and Services                                               Social Determinants of Health (SDOH) Interventions SDOH Screenings   Food Insecurity: No Food Insecurity (05/12/2022)  Housing: Low Risk  (05/12/2022)  Transportation Needs: No Transportation Needs (05/12/2022)  Utilities: Not At Risk (05/12/2022)  Alcohol Screen: Low Risk  (04/30/2021)  Depression (PHQ2-9): Low Risk  (04/30/2021)  Financial Resource Strain: Low Risk  (04/30/2021)  Physical Activity: Insufficiently Active (04/30/2021)  Social Connections: Moderately Integrated (04/30/2021)  Stress: No Stress Concern Present (04/30/2021)  Tobacco Use: Low Risk  (05/13/2022)    Readmission Risk Interventions     No data  to display

## 2022-05-14 NOTE — Progress Notes (Signed)
[facility]) Hospital Hospice Liaison Note  Referral received for [facility] per request of family.  Met with patient and extended family in room and spoke with daughter, Josephina Shih, by phone.  Chart reviewed.  Patient does not meet criteria for [facility] at this time.  Family would like for her to return to [facility] with [facility] hospice to follow.  Liaison will continue to follow for discharge planning.  Thank you for the opportunity to participate in this patient's care.  Please don't hesitate to reach out with any hospice related questions.  Jhonnie Garner, BSN, RN [facility] Liaison [phone]

## 2022-05-16 DIAGNOSIS — K449 Diaphragmatic hernia without obstruction or gangrene: Secondary | ICD-10-CM | POA: Diagnosis not present

## 2022-05-16 DIAGNOSIS — Z6831 Body mass index (BMI) 31.0-31.9, adult: Secondary | ICD-10-CM | POA: Diagnosis not present

## 2022-05-16 DIAGNOSIS — E785 Hyperlipidemia, unspecified: Secondary | ICD-10-CM | POA: Diagnosis not present

## 2022-05-16 DIAGNOSIS — J1089 Influenza due to other identified influenza virus with other manifestations: Secondary | ICD-10-CM | POA: Diagnosis not present

## 2022-05-16 DIAGNOSIS — K219 Gastro-esophageal reflux disease without esophagitis: Secondary | ICD-10-CM | POA: Diagnosis not present

## 2022-05-16 DIAGNOSIS — J9621 Acute and chronic respiratory failure with hypoxia: Secondary | ICD-10-CM | POA: Diagnosis not present

## 2022-05-16 DIAGNOSIS — L89154 Pressure ulcer of sacral region, stage 4: Secondary | ICD-10-CM | POA: Diagnosis not present

## 2022-05-16 DIAGNOSIS — J439 Emphysema, unspecified: Secondary | ICD-10-CM | POA: Diagnosis not present

## 2022-05-16 DIAGNOSIS — R1310 Dysphagia, unspecified: Secondary | ICD-10-CM | POA: Diagnosis not present

## 2022-05-16 DIAGNOSIS — R634 Abnormal weight loss: Secondary | ICD-10-CM | POA: Diagnosis not present

## 2022-05-16 DIAGNOSIS — J690 Pneumonitis due to inhalation of food and vomit: Secondary | ICD-10-CM | POA: Diagnosis not present

## 2022-05-16 DIAGNOSIS — I700 Atherosclerosis of aorta: Secondary | ICD-10-CM | POA: Diagnosis not present

## 2022-05-16 DIAGNOSIS — F0284 Dementia in other diseases classified elsewhere, unspecified severity, with anxiety: Secondary | ICD-10-CM | POA: Diagnosis not present

## 2022-05-16 DIAGNOSIS — G2581 Restless legs syndrome: Secondary | ICD-10-CM | POA: Diagnosis not present

## 2022-05-16 DIAGNOSIS — I517 Cardiomegaly: Secondary | ICD-10-CM | POA: Diagnosis not present

## 2022-05-16 DIAGNOSIS — G311 Senile degeneration of brain, not elsewhere classified: Secondary | ICD-10-CM | POA: Diagnosis not present

## 2022-05-16 LAB — CULTURE, BLOOD (ROUTINE X 2): Special Requests: ADEQUATE

## 2022-05-17 DIAGNOSIS — E871 Hypo-osmolality and hyponatremia: Secondary | ICD-10-CM | POA: Diagnosis not present

## 2022-05-17 DIAGNOSIS — J1089 Influenza due to other identified influenza virus with other manifestations: Secondary | ICD-10-CM | POA: Diagnosis not present

## 2022-05-17 DIAGNOSIS — R1310 Dysphagia, unspecified: Secondary | ICD-10-CM | POA: Diagnosis not present

## 2022-05-17 DIAGNOSIS — L89154 Pressure ulcer of sacral region, stage 4: Secondary | ICD-10-CM | POA: Diagnosis not present

## 2022-05-17 DIAGNOSIS — I700 Atherosclerosis of aorta: Secondary | ICD-10-CM | POA: Diagnosis not present

## 2022-05-17 DIAGNOSIS — K5900 Constipation, unspecified: Secondary | ICD-10-CM | POA: Diagnosis not present

## 2022-05-17 DIAGNOSIS — I517 Cardiomegaly: Secondary | ICD-10-CM | POA: Diagnosis not present

## 2022-05-17 DIAGNOSIS — F0284 Dementia in other diseases classified elsewhere, unspecified severity, with anxiety: Secondary | ICD-10-CM | POA: Diagnosis not present

## 2022-05-17 DIAGNOSIS — J439 Emphysema, unspecified: Secondary | ICD-10-CM | POA: Diagnosis not present

## 2022-05-17 DIAGNOSIS — G4700 Insomnia, unspecified: Secondary | ICD-10-CM | POA: Diagnosis not present

## 2022-05-17 DIAGNOSIS — I10 Essential (primary) hypertension: Secondary | ICD-10-CM | POA: Diagnosis not present

## 2022-05-17 DIAGNOSIS — E785 Hyperlipidemia, unspecified: Secondary | ICD-10-CM | POA: Diagnosis not present

## 2022-05-17 DIAGNOSIS — J690 Pneumonitis due to inhalation of food and vomit: Secondary | ICD-10-CM | POA: Diagnosis not present

## 2022-05-17 DIAGNOSIS — G2581 Restless legs syndrome: Secondary | ICD-10-CM | POA: Diagnosis not present

## 2022-05-17 DIAGNOSIS — Z6831 Body mass index (BMI) 31.0-31.9, adult: Secondary | ICD-10-CM | POA: Diagnosis not present

## 2022-05-17 DIAGNOSIS — R634 Abnormal weight loss: Secondary | ICD-10-CM | POA: Diagnosis not present

## 2022-05-17 DIAGNOSIS — K219 Gastro-esophageal reflux disease without esophagitis: Secondary | ICD-10-CM | POA: Diagnosis not present

## 2022-05-17 DIAGNOSIS — K449 Diaphragmatic hernia without obstruction or gangrene: Secondary | ICD-10-CM | POA: Diagnosis not present

## 2022-05-17 DIAGNOSIS — J9621 Acute and chronic respiratory failure with hypoxia: Secondary | ICD-10-CM | POA: Diagnosis not present

## 2022-05-17 DIAGNOSIS — G311 Senile degeneration of brain, not elsewhere classified: Secondary | ICD-10-CM | POA: Diagnosis not present

## 2022-05-17 DIAGNOSIS — R627 Adult failure to thrive: Secondary | ICD-10-CM | POA: Diagnosis not present

## 2022-05-17 DIAGNOSIS — E507 Other ocular manifestations of vitamin A deficiency: Secondary | ICD-10-CM | POA: Diagnosis not present

## 2022-05-17 DIAGNOSIS — J189 Pneumonia, unspecified organism: Secondary | ICD-10-CM | POA: Diagnosis not present

## 2022-05-17 DIAGNOSIS — J9600 Acute respiratory failure, unspecified whether with hypoxia or hypercapnia: Secondary | ICD-10-CM | POA: Diagnosis not present

## 2022-05-17 LAB — CULTURE, BLOOD (ROUTINE X 2): Culture: NO GROWTH

## 2022-05-18 DIAGNOSIS — G311 Senile degeneration of brain, not elsewhere classified: Secondary | ICD-10-CM | POA: Diagnosis not present

## 2022-05-18 DIAGNOSIS — J690 Pneumonitis due to inhalation of food and vomit: Secondary | ICD-10-CM | POA: Diagnosis not present

## 2022-05-18 DIAGNOSIS — F0284 Dementia in other diseases classified elsewhere, unspecified severity, with anxiety: Secondary | ICD-10-CM | POA: Diagnosis not present

## 2022-05-18 DIAGNOSIS — J439 Emphysema, unspecified: Secondary | ICD-10-CM | POA: Diagnosis not present

## 2022-05-18 DIAGNOSIS — R1310 Dysphagia, unspecified: Secondary | ICD-10-CM | POA: Diagnosis not present

## 2022-05-18 DIAGNOSIS — J9621 Acute and chronic respiratory failure with hypoxia: Secondary | ICD-10-CM | POA: Diagnosis not present

## 2022-05-18 DIAGNOSIS — L89153 Pressure ulcer of sacral region, stage 3: Secondary | ICD-10-CM | POA: Diagnosis not present

## 2022-05-19 DIAGNOSIS — J690 Pneumonitis due to inhalation of food and vomit: Secondary | ICD-10-CM | POA: Diagnosis not present

## 2022-05-19 DIAGNOSIS — G4700 Insomnia, unspecified: Secondary | ICD-10-CM | POA: Diagnosis not present

## 2022-05-19 DIAGNOSIS — M545 Low back pain, unspecified: Secondary | ICD-10-CM | POA: Diagnosis not present

## 2022-05-19 DIAGNOSIS — K5900 Constipation, unspecified: Secondary | ICD-10-CM | POA: Diagnosis not present

## 2022-05-19 DIAGNOSIS — I10 Essential (primary) hypertension: Secondary | ICD-10-CM | POA: Diagnosis not present

## 2022-05-19 DIAGNOSIS — G2581 Restless legs syndrome: Secondary | ICD-10-CM | POA: Diagnosis not present

## 2022-05-19 DIAGNOSIS — G311 Senile degeneration of brain, not elsewhere classified: Secondary | ICD-10-CM | POA: Diagnosis not present

## 2022-05-19 DIAGNOSIS — G8929 Other chronic pain: Secondary | ICD-10-CM | POA: Diagnosis not present

## 2022-05-19 DIAGNOSIS — J439 Emphysema, unspecified: Secondary | ICD-10-CM | POA: Diagnosis not present

## 2022-05-19 DIAGNOSIS — F0284 Dementia in other diseases classified elsewhere, unspecified severity, with anxiety: Secondary | ICD-10-CM | POA: Diagnosis not present

## 2022-05-19 DIAGNOSIS — J189 Pneumonia, unspecified organism: Secondary | ICD-10-CM | POA: Diagnosis not present

## 2022-05-19 DIAGNOSIS — R627 Adult failure to thrive: Secondary | ICD-10-CM | POA: Diagnosis not present

## 2022-05-19 DIAGNOSIS — J9600 Acute respiratory failure, unspecified whether with hypoxia or hypercapnia: Secondary | ICD-10-CM | POA: Diagnosis not present

## 2022-05-19 DIAGNOSIS — M1990 Unspecified osteoarthritis, unspecified site: Secondary | ICD-10-CM | POA: Diagnosis not present

## 2022-05-19 DIAGNOSIS — E871 Hypo-osmolality and hyponatremia: Secondary | ICD-10-CM | POA: Diagnosis not present

## 2022-05-19 DIAGNOSIS — R1310 Dysphagia, unspecified: Secondary | ICD-10-CM | POA: Diagnosis not present

## 2022-05-19 DIAGNOSIS — J9621 Acute and chronic respiratory failure with hypoxia: Secondary | ICD-10-CM | POA: Diagnosis not present

## 2022-05-20 DIAGNOSIS — J9621 Acute and chronic respiratory failure with hypoxia: Secondary | ICD-10-CM | POA: Diagnosis not present

## 2022-05-20 DIAGNOSIS — G311 Senile degeneration of brain, not elsewhere classified: Secondary | ICD-10-CM | POA: Diagnosis not present

## 2022-05-20 DIAGNOSIS — R1310 Dysphagia, unspecified: Secondary | ICD-10-CM | POA: Diagnosis not present

## 2022-05-20 DIAGNOSIS — J690 Pneumonitis due to inhalation of food and vomit: Secondary | ICD-10-CM | POA: Diagnosis not present

## 2022-05-20 DIAGNOSIS — J439 Emphysema, unspecified: Secondary | ICD-10-CM | POA: Diagnosis not present

## 2022-05-20 DIAGNOSIS — F0284 Dementia in other diseases classified elsewhere, unspecified severity, with anxiety: Secondary | ICD-10-CM | POA: Diagnosis not present

## 2022-06-16 DEATH — deceased
